# Patient Record
Sex: Female | Born: 1985 | Hispanic: Yes | Marital: Married | Smoking: Never smoker
Health system: Southern US, Community
[De-identification: ages and names within clinical notes are randomized; demographics above are authoritative.]

## PROBLEM LIST (undated history)

## (undated) DIAGNOSIS — O139 Gestational [pregnancy-induced] hypertension without significant proteinuria, unspecified trimester: Secondary | ICD-10-CM

## (undated) DIAGNOSIS — N289 Disorder of kidney and ureter, unspecified: Secondary | ICD-10-CM

## (undated) DIAGNOSIS — K8000 Calculus of gallbladder with acute cholecystitis without obstruction: Secondary | ICD-10-CM

## (undated) DIAGNOSIS — O24419 Gestational diabetes mellitus in pregnancy, unspecified control: Secondary | ICD-10-CM

## (undated) DIAGNOSIS — N83209 Unspecified ovarian cyst, unspecified side: Secondary | ICD-10-CM

## (undated) DIAGNOSIS — K8050 Calculus of bile duct without cholangitis or cholecystitis without obstruction: Secondary | ICD-10-CM

## (undated) DIAGNOSIS — R7989 Other specified abnormal findings of blood chemistry: Secondary | ICD-10-CM

## (undated) DIAGNOSIS — R945 Abnormal results of liver function studies: Secondary | ICD-10-CM

## (undated) HISTORY — PX: CHOLECYSTECTOMY: SHX55

## (undated) HISTORY — DX: Gestational diabetes mellitus in pregnancy, unspecified control: O24.419

## (undated) HISTORY — DX: Calculus of bile duct without cholangitis or cholecystitis without obstruction: K80.50

## (undated) SURGERY — Surgical Case
Anesthesia: *Unknown

---

## 2008-07-19 DIAGNOSIS — O24419 Gestational diabetes mellitus in pregnancy, unspecified control: Secondary | ICD-10-CM

## 2015-08-13 ENCOUNTER — Other Ambulatory Visit (HOSPITAL_COMMUNITY): Payer: Self-pay | Admitting: *Deleted

## 2015-08-13 DIAGNOSIS — N6320 Unspecified lump in the left breast, unspecified quadrant: Secondary | ICD-10-CM

## 2015-09-01 ENCOUNTER — Ambulatory Visit (HOSPITAL_COMMUNITY)
Admission: RE | Admit: 2015-09-01 | Discharge: 2015-09-01 | Disposition: A | Discharge status: Home / Self Care | Payer: PRIVATE HEALTH INSURANCE | Source: Ambulatory Visit | Attending: *Deleted | Admitting: *Deleted

## 2015-09-01 DIAGNOSIS — N6320 Unspecified lump in the left breast, unspecified quadrant: Secondary | ICD-10-CM

## 2015-09-01 DIAGNOSIS — N63 Unspecified lump in breast: Secondary | ICD-10-CM | POA: Diagnosis present

## 2017-09-23 ENCOUNTER — Encounter (HOSPITAL_COMMUNITY): Payer: Self-pay | Admitting: Emergency Medicine

## 2017-09-23 ENCOUNTER — Emergency Department (HOSPITAL_COMMUNITY): Payer: Self-pay

## 2017-09-23 ENCOUNTER — Emergency Department (HOSPITAL_COMMUNITY)
Admission: EM | Admit: 2017-09-23 | Discharge: 2017-09-24 | Disposition: A | Discharge status: Home / Self Care | Payer: Self-pay | Attending: Emergency Medicine | Admitting: Emergency Medicine

## 2017-09-23 DIAGNOSIS — N83202 Unspecified ovarian cyst, left side: Secondary | ICD-10-CM | POA: Insufficient documentation

## 2017-09-23 DIAGNOSIS — R1010 Upper abdominal pain, unspecified: Secondary | ICD-10-CM

## 2017-09-23 DIAGNOSIS — Z79899 Other long term (current) drug therapy: Secondary | ICD-10-CM | POA: Insufficient documentation

## 2017-09-23 LAB — POC URINE PREG, ED: Preg Test, Ur: NEGATIVE

## 2017-09-23 MED ORDER — ONDANSETRON HCL 4 MG/2ML IJ SOLN
4.0000 mg | Freq: Once | INTRAMUSCULAR | Status: AC
  Administered 2017-09-24: 4 mg via INTRAVENOUS
  Filled 2017-09-23: qty 2

## 2017-09-23 MED ORDER — MORPHINE SULFATE (PF) 4 MG/ML IV SOLN
4.0000 mg | Freq: Once | INTRAVENOUS | Status: AC
  Administered 2017-09-24: 4 mg via INTRAVENOUS
  Filled 2017-09-23: qty 1

## 2017-09-23 NOTE — ED Triage Notes (Addendum)
Pt C/O abdominal pain to the left and right side. Pt states when the pain starts it feels like her hands, face and feet are "going to sleep."

## 2017-09-23 NOTE — ED Provider Notes (Signed)
[facility] EMERGENCY DEPARTMENT Provider Note   CSN: [id number] Arrival date & time: 09/23/17  1910     History   Chief Complaint Chief Complaint  Patient presents with  . Abdominal Pain    HPI Colleen Arnold is a 31 y.o. female.  The history is provided by the patient.  She has been having pain across her mid and upper abdomen for the last year, worse over the last 3 months.  It is much worse tonight.  Pain is worse after eating.  Spicy food seems to cause more problems than other foods.  There is associated nausea and occasional vomiting.  She does not notice any improvement in pain following vomiting.  She denies fever or chills.  Denies constipation or diarrhea.  Of note, she is status post cholecystectomy.  She did see a physician who prescribed omeprazole, but that is not giving her any relief.  History reviewed. No pertinent past medical history.  There are no active problems to display for this patient.   History reviewed. No pertinent surgical history.  OB History    No data available       Home Medications    Prior to Admission medications   Medication Sig Start Date End Date Taking? Authorizing Provider  omeprazole (PRILOSEC) 20 MG capsule Take 20 mg by mouth 2 (two) times daily.   Yes [provider]    Family History No family history on file.  Social History Social History   Tobacco Use  . Smoking status: Never Smoker  . Smokeless tobacco: Never Used  Substance Use Topics  . Alcohol use: Not on file  . Drug use: Not on file     Allergies   Rocephin [ceftriaxone sodium in dextrose]   Review of Systems Review of Systems  All other systems reviewed and are negative.    Physical Exam Updated Vital Signs BP 110/61   Pulse 65   Temp 98.3 F (36.8 C) (Oral)   Resp 18   Ht 5\' 3"  (1.6 m)   Wt 110.2 kg (242 lb 14.4 oz)   LMP 09/09/2017   SpO2 98%   BMI 43.03 kg/m   Physical Exam  Nursing note and vitals reviewed.  Obese  31 year old female, resting comfortably and in no acute distress. Vital signs are normal. Oxygen saturation is 98%, which is normal. Head is normocephalic and atraumatic. PERRLA, EOMI. Oropharynx is clear. Neck is nontender and supple without adenopathy or JVD. Back is nontender and there is no CVA tenderness. Lungs are clear without rales, wheezes, or rhonchi. Chest is nontender. Heart has regular rate and rhythm without murmur. Abdomen is soft, flat, with moderate tenderness across the mid and upper abdomen.  Maximum tenderness is in the right upper quadrant with positive Murphy sign.  There are no masses or hepatosplenomegaly and peristalsis is hypoactive. Extremities have no cyanosis or edema, full range of motion is present. Skin is warm and dry without rash. Neurologic: Mental status is normal, cranial nerves are intact, there are no motor or sensory deficits.  ED Treatments / Results  Labs (all labs ordered are listed, but only abnormal results are displayed) Labs Reviewed  COMPREHENSIVE METABOLIC PANEL - Abnormal; Notable for the following components:      Result Value   Potassium 3.3 (*)    Glucose, Bld 106 (*)    All other components within normal limits  LIPASE, BLOOD  CBC WITH DIFFERENTIAL/PLATELET  URINALYSIS, ROUTINE W REFLEX MICROSCOPIC  POC URINE PREG,  ED   Radiology Ct Abdomen Pelvis W Contrast  Result Date: 09/24/2017 CLINICAL DATA:  31 year old female with left-sided abdominal pain. EXAM: CT ABDOMEN AND PELVIS WITH CONTRAST TECHNIQUE: Multidetector CT imaging of the abdomen and pelvis was performed using the standard protocol following bolus administration of intravenous contrast. CONTRAST:  171mL ISOVUE-300 IOPAMIDOL (ISOVUE-300) INJECTION 61% COMPARISON:  None. FINDINGS: Lower chest: The visualized lung bases are clear. No intra-abdominal free air or free fluid. Hepatobiliary: Probable mild fatty infiltration of the liver. Cholecystectomy. There is pneumobilia. Mild  dilatation of the common bile duct measuring up to 15 mm. No retained calcified stone noted in the central CBD. Pancreas: Unremarkable. No pancreatic ductal dilatation or surrounding inflammatory changes. Spleen: Normal in size without focal abnormality. Adrenals/Urinary Tract: Adrenal glands are unremarkable. Kidneys are normal, without renal calculi, focal lesion, or hydronephrosis. Bladder is unremarkable. Stomach/Bowel: There is no bowel obstruction or active inflammation. Normal appendix. Vascular/Lymphatic: The abdominal aorta and IVC appear unremarkable. No portal venous gas. There is no adenopathy. Reproductive: The uterus is anteverted and grossly unremarkable. There is a septated appearing cyst in the left ovary measuring approximately 5.7 x 5.6 x 6.3 cm. There is associated mild mass effect and indentation of the left bladder wall. The right ovary appears unremarkable. Other: None Musculoskeletal: No acute or significant osseous findings. IMPRESSION: 1. A probably septated left ovarian cyst measuring up to 6.3 cm. No associated inflammatory changes identified. Ultrasound with Doppler interrogation of the ovaries may provide better evaluation if there is clinical concern for torsion. 2. Cholecystectomy with associated pneumobilia. 3. No bowel obstruction or active inflammation.  Normal appendix. Electronically Signed   By: Anner Crete M.D.   On: 09/24/2017 00:41    Procedures Procedures (including critical care time)  Medications Ordered in ED Medications  ondansetron (ZOFRAN) injection 4 mg (4 mg Intravenous Given 09/24/17 0017)  morphine 4 MG/ML injection 4 mg (4 mg Intravenous Given 09/24/17 0018)  iopamidol (ISOVUE-300) 61 % injection 100 mL (100 mLs Intravenous Contrast Given 09/24/17 0026)     Initial Impression / Assessment and Plan / ED Course  I have reviewed the triage vital signs and the nursing notes.  Pertinent labs & imaging results that were available during my care of the  patient were reviewed by me and considered in my medical decision making (see chart for details).  Abdominal pain of uncertain cause.  Possible peptic ulcer disease.  Given chronicity of symptoms, will check CT of abdomen and pelvis.  CT shows no obvious cause for pain.  Incidental finding of ovarian cyst.  Patient is advised of these findings.  We will referred to gastroenterology for further outpatient workup.  In the meantime, she is to continue taking omeprazole.  Given prescriptions for oxycodone-acetaminophen for pain, and ondansetron for nausea.  Final Clinical Impressions(s) / ED Diagnoses   Final diagnoses:  Pain of upper abdomen  Cyst of left ovary    ED Discharge Orders        Ordered    oxyCODONE-acetaminophen (PERCOCET) 5-325 MG tablet  Every 4 hours PRN     09/24/17 0152    ondansetron (ZOFRAN) 4 MG tablet  Every 8 hours PRN     09/24/17 0152    oxyCODONE-acetaminophen (PERCOCET) 5-325 MG tablet  Every 4 hours PRN     09/24/17 0152    ondansetron (ZOFRAN) 4 MG tablet  Every 6 hours PRN     81/01/75 1025       Delora Fuel, MD 85/27/78 [phone]

## 2017-09-24 LAB — CBC WITH DIFFERENTIAL/PLATELET
BASOS ABS: 0 10*3/uL (ref 0.0–0.1)
BASOS PCT: 0 %
EOS ABS: 0.1 10*3/uL (ref 0.0–0.7)
EOS PCT: 1 %
HCT: 38.2 % (ref 36.0–46.0)
Hemoglobin: 12.7 g/dL (ref 12.0–15.0)
Lymphocytes Relative: 36 %
Lymphs Abs: 3.2 10*3/uL (ref 0.7–4.0)
MCH: 30.3 pg (ref 26.0–34.0)
MCHC: 33.2 g/dL (ref 30.0–36.0)
MCV: 91.2 fL (ref 78.0–100.0)
MONO ABS: 0.6 10*3/uL (ref 0.1–1.0)
Monocytes Relative: 7 %
Neutro Abs: 4.9 10*3/uL (ref 1.7–7.7)
Neutrophils Relative %: 56 %
PLATELETS: 213 10*3/uL (ref 150–400)
RBC: 4.19 MIL/uL (ref 3.87–5.11)
RDW: 12.4 % (ref 11.5–15.5)
WBC: 8.7 10*3/uL (ref 4.0–10.5)

## 2017-09-24 LAB — URINALYSIS, ROUTINE W REFLEX MICROSCOPIC
Bacteria, UA: NONE SEEN
Bilirubin Urine: NEGATIVE
GLUCOSE, UA: NEGATIVE mg/dL
KETONES UR: NEGATIVE mg/dL
LEUKOCYTES UA: NEGATIVE
NITRITE: NEGATIVE
PH: 6 (ref 5.0–8.0)
Protein, ur: NEGATIVE mg/dL
Specific Gravity, Urine: 1.016 (ref 1.005–1.030)

## 2017-09-24 LAB — COMPREHENSIVE METABOLIC PANEL
ALT: 31 U/L (ref 14–54)
AST: 28 U/L (ref 15–41)
Albumin: 3.8 g/dL (ref 3.5–5.0)
Alkaline Phosphatase: 49 U/L (ref 38–126)
Anion gap: 5 (ref 5–15)
BILIRUBIN TOTAL: 0.4 mg/dL (ref 0.3–1.2)
BUN: 17 mg/dL (ref 6–20)
CALCIUM: 9.1 mg/dL (ref 8.9–10.3)
CO2: 27 mmol/L (ref 22–32)
CREATININE: 0.61 mg/dL (ref 0.44–1.00)
Chloride: 108 mmol/L (ref 101–111)
Glucose, Bld: 106 mg/dL — ABNORMAL HIGH (ref 65–99)
Potassium: 3.3 mmol/L — ABNORMAL LOW (ref 3.5–5.1)
Sodium: 140 mmol/L (ref 135–145)
TOTAL PROTEIN: 7 g/dL (ref 6.5–8.1)

## 2017-09-24 LAB — LIPASE, BLOOD: LIPASE: 30 U/L (ref 11–51)

## 2017-09-24 MED ORDER — OXYCODONE-ACETAMINOPHEN 5-325 MG PO TABS: 1.0000 | ORAL_TABLET | ORAL | 0 refills | Status: DC | PRN

## 2017-09-24 MED ORDER — ONDANSETRON HCL 4 MG PO TABS
4.0000 mg | ORAL_TABLET | Freq: Four times a day (QID) | ORAL | 0 refills | Status: DC | PRN
Start: 2017-09-24 — End: 2017-10-07

## 2017-09-24 MED ORDER — IOPAMIDOL (ISOVUE-300) INJECTION 61%
100.0000 mL | Freq: Once | INTRAVENOUS | Status: AC | PRN
  Administered 2017-09-24: 100 mL via INTRAVENOUS

## 2017-09-24 MED ORDER — ONDANSETRON HCL 4 MG PO TABS: 4.0000 mg | ORAL_TABLET | Freq: Three times a day (TID) | ORAL | 0 refills | Status: DC | PRN

## 2017-09-24 NOTE — Discharge Instructions (Signed)
Continuar tomando omeprazole.  Regresar si los sntomas empeoran.

## 2017-09-27 ENCOUNTER — Encounter: Payer: Self-pay | Admitting: Internal Medicine

## 2017-09-28 MED FILL — Ondansetron HCl Tab 4 MG: ORAL | Qty: 4 | Status: AC

## 2017-09-28 MED FILL — Oxycodone w/ Acetaminophen Tab 5-325 MG: ORAL | Qty: 6 | Status: AC

## 2017-10-04 ENCOUNTER — Other Ambulatory Visit (HOSPITAL_COMMUNITY): Payer: Self-pay | Admitting: Nurse Practitioner

## 2017-10-04 DIAGNOSIS — R109 Unspecified abdominal pain: Secondary | ICD-10-CM

## 2017-10-04 DIAGNOSIS — N83202 Unspecified ovarian cyst, left side: Secondary | ICD-10-CM

## 2017-10-05 ENCOUNTER — Other Ambulatory Visit: Payer: Self-pay

## 2017-10-05 ENCOUNTER — Emergency Department (HOSPITAL_COMMUNITY): Payer: Self-pay

## 2017-10-05 ENCOUNTER — Encounter (HOSPITAL_COMMUNITY): Payer: Self-pay | Admitting: *Deleted

## 2017-10-05 ENCOUNTER — Inpatient Hospital Stay (HOSPITAL_COMMUNITY)
Admission: EM | Admit: 2017-10-05 | Discharge: 2017-10-07 | DRG: 446 | Disposition: A | Discharge status: Home / Self Care | Payer: Self-pay | Attending: Internal Medicine | Admitting: Internal Medicine

## 2017-10-05 DIAGNOSIS — Z79899 Other long term (current) drug therapy: Secondary | ICD-10-CM

## 2017-10-05 DIAGNOSIS — N83202 Unspecified ovarian cyst, left side: Secondary | ICD-10-CM | POA: Diagnosis present

## 2017-10-05 DIAGNOSIS — K831 Obstruction of bile duct: Secondary | ICD-10-CM | POA: Diagnosis present

## 2017-10-05 DIAGNOSIS — R7989 Other specified abnormal findings of blood chemistry: Secondary | ICD-10-CM

## 2017-10-05 DIAGNOSIS — K5900 Constipation, unspecified: Secondary | ICD-10-CM | POA: Diagnosis present

## 2017-10-05 DIAGNOSIS — R109 Unspecified abdominal pain: Secondary | ICD-10-CM | POA: Diagnosis present

## 2017-10-05 DIAGNOSIS — N838 Other noninflammatory disorders of ovary, fallopian tube and broad ligament: Secondary | ICD-10-CM | POA: Diagnosis present

## 2017-10-05 DIAGNOSIS — E876 Hypokalemia: Secondary | ICD-10-CM | POA: Diagnosis present

## 2017-10-05 DIAGNOSIS — K8050 Calculus of bile duct without cholangitis or cholecystitis without obstruction: Secondary | ICD-10-CM

## 2017-10-05 DIAGNOSIS — K8031 Calculus of bile duct with cholangitis, unspecified, with obstruction: Principal | ICD-10-CM | POA: Diagnosis present

## 2017-10-05 DIAGNOSIS — K8051 Calculus of bile duct without cholangitis or cholecystitis with obstruction: Secondary | ICD-10-CM | POA: Insufficient documentation

## 2017-10-05 DIAGNOSIS — R1013 Epigastric pain: Secondary | ICD-10-CM

## 2017-10-05 DIAGNOSIS — R945 Abnormal results of liver function studies: Secondary | ICD-10-CM

## 2017-10-05 DIAGNOSIS — Z9049 Acquired absence of other specified parts of digestive tract: Secondary | ICD-10-CM

## 2017-10-05 DIAGNOSIS — K219 Gastro-esophageal reflux disease without esophagitis: Secondary | ICD-10-CM | POA: Diagnosis present

## 2017-10-05 DIAGNOSIS — Z6839 Body mass index (BMI) 39.0-39.9, adult: Secondary | ICD-10-CM

## 2017-10-05 HISTORY — DX: Calculus of gallbladder with acute cholecystitis without obstruction: K80.00

## 2017-10-05 HISTORY — DX: Other specified abnormal findings of blood chemistry: R79.89

## 2017-10-05 HISTORY — DX: Abnormal results of liver function studies: R94.5

## 2017-10-05 HISTORY — DX: Unspecified ovarian cyst, unspecified side: N83.209

## 2017-10-05 LAB — COMPREHENSIVE METABOLIC PANEL
ALBUMIN: 3.8 g/dL (ref 3.5–5.0)
ALT: 193 U/L — ABNORMAL HIGH (ref 14–54)
ANION GAP: 8 (ref 5–15)
AST: 194 U/L — ABNORMAL HIGH (ref 15–41)
Alkaline Phosphatase: 116 U/L (ref 38–126)
BUN: 8 mg/dL (ref 6–20)
CALCIUM: 8.8 mg/dL — AB (ref 8.9–10.3)
CHLORIDE: 106 mmol/L (ref 101–111)
CO2: 24 mmol/L (ref 22–32)
Creatinine, Ser: 0.65 mg/dL (ref 0.44–1.00)
GFR calc non Af Amer: >60 mL/min (ref >60)
GLUCOSE: 135 mg/dL — AB (ref 65–99)
POTASSIUM: 3.4 mmol/L — AB (ref 3.5–5.1)
SODIUM: 138 mmol/L (ref 135–145)
Total Bilirubin: 1.4 mg/dL — ABNORMAL HIGH (ref 0.3–1.2)
Total Protein: 7.2 g/dL (ref 6.5–8.1)

## 2017-10-05 LAB — URINALYSIS, ROUTINE W REFLEX MICROSCOPIC
BILIRUBIN URINE: NEGATIVE
Glucose, UA: NEGATIVE mg/dL
Ketones, ur: 20 mg/dL — AB
Leukocytes, UA: NEGATIVE
Nitrite: NEGATIVE
Protein, ur: NEGATIVE mg/dL
SPECIFIC GRAVITY, URINE: 1.012 (ref 1.005–1.030)
pH: 7 (ref 5.0–8.0)

## 2017-10-05 LAB — CBC
HEMATOCRIT: 36.5 % (ref 36.0–46.0)
HEMOGLOBIN: 13 g/dL (ref 12.0–15.0)
MCH: 31.2 pg (ref 26.0–34.0)
MCHC: 35.6 g/dL (ref 30.0–36.0)
MCV: 87.5 fL (ref 78.0–100.0)
Platelets: 222 10*3/uL (ref 150–400)
RBC: 4.17 MIL/uL (ref 3.87–5.11)
RDW: 12.3 % (ref 11.5–15.5)
WBC: 6.3 10*3/uL (ref 4.0–10.5)

## 2017-10-05 LAB — LIPASE, BLOOD: LIPASE: 38 U/L (ref 11–51)

## 2017-10-05 LAB — I-STAT BETA HCG BLOOD, ED (MC, WL, AP ONLY)

## 2017-10-05 MED ORDER — IOPAMIDOL (ISOVUE-300) INJECTION 61%
INTRAVENOUS | Status: AC
  Filled 2017-10-05: qty 30

## 2017-10-05 MED ORDER — ONDANSETRON HCL 4 MG/2ML IJ SOLN
4.0000 mg | Freq: Once | INTRAMUSCULAR | Status: AC
  Administered 2017-10-05: 4 mg via INTRAVENOUS
  Filled 2017-10-05: qty 2

## 2017-10-05 MED ORDER — HYDROMORPHONE HCL 1 MG/ML IJ SOLN
1.0000 mg | Freq: Once | INTRAMUSCULAR | Status: AC
  Administered 2017-10-05: 1 mg via INTRAVENOUS
  Filled 2017-10-05: qty 1

## 2017-10-05 MED ORDER — SUCRALFATE 1 G PO TABS
1.0000 g | ORAL_TABLET | Freq: Once | ORAL | Status: AC
  Administered 2017-10-05: 1 g via ORAL
  Filled 2017-10-05: qty 1

## 2017-10-05 MED ORDER — PANTOPRAZOLE SODIUM 40 MG IV SOLR
40.0000 mg | Freq: Once | INTRAVENOUS | Status: AC
  Administered 2017-10-05: 40 mg via INTRAVENOUS
  Filled 2017-10-05: qty 40

## 2017-10-05 MED ORDER — IOPAMIDOL (ISOVUE-300) INJECTION 61%
INTRAVENOUS | Status: AC
  Administered 2017-10-05: 75 mL
  Filled 2017-10-05: qty 75

## 2017-10-05 MED ORDER — GI COCKTAIL ~~LOC~~
30.0000 mL | Freq: Once | ORAL | Status: AC
Start: 2017-10-05 — End: 2017-10-05
  Administered 2017-10-05: 30 mL via ORAL
  Filled 2017-10-05: qty 30

## 2017-10-05 NOTE — ED Provider Notes (Signed)
[facility] EMERGENCY DEPARTMENT Provider Note   CSN: [id number] Arrival date & time: 10/05/17  1736     History   Chief Complaint Chief Complaint  Patient presents with  . Abdominal Pain    HPI Colleen Arnold is a 31 y.o. female.  HPI  Colleen Arnold is a 31 y.o. female presents to emergency department complaining of epigastric abdominal pain.  Patient reports pain for several months.  She states pain radiates around to the back.  She reports pain is severe.  Was seen a few weeks ago, had unremarkable labs and negative CT abdomen pelvis.  Patient is currently taking oxycodone and Zofran which is not helping.  She denies change in her bowels.  She states she is unable to eat and has lost a lot of weight due to this.  She states any eating or drinking making her pain worse.  Nothing is making it better.  She reports associated nausea and vomiting.  Denies blood in her or emesis.  She has an appointment with gastroenterologist in February which is 2 months from now.  She denies drinking alcohol.  She states she used to drink alcohol but has quit.  She is taking ibuprofen for headache, but states she is not taking it daily.  She denies smoking.  No other complaints   History reviewed. No pertinent past medical history.  There are no active problems to display for this patient.   History reviewed. No pertinent surgical history.  OB History    No data available       Home Medications    Prior to Admission medications   Medication Sig Start Date End Date Taking? Authorizing Provider  metroNIDAZOLE (FLAGYL) 500 MG tablet Take 500 mg by mouth 2 (two) times daily.   Yes [provider]  omeprazole (PRILOSEC) 20 MG capsule Take 20 mg by mouth 2 (two) times daily.   Yes [provider]  ondansetron (ZOFRAN) 4 MG tablet Take 1 tablet (4 mg total) by mouth every 8 (eight) hours as needed for nausea or vomiting. 50/5/39  Yes Delora Fuel, MD    oxyCODONE-acetaminophen (PERCOCET) 5-325 MG tablet Take 1 tablet by mouth every 4 (four) hours as needed for moderate pain. 76/7/34  Yes Delora Fuel, MD  ondansetron (ZOFRAN) 4 MG tablet Take 1 tablet (4 mg total) by mouth every 6 (six) hours as needed. Patient not taking: Reported on 19/37/9024 06/23/34   Delora Fuel, MD  oxyCODONE-acetaminophen (PERCOCET) 5-325 MG tablet Take 1 tablet by mouth every 4 (four) hours as needed for moderate pain. Patient not taking: Reported on 32/99/2426 83/4/19   Delora Fuel, MD    Family History History reviewed. No pertinent family history.  Social History Social History   Tobacco Use  . Smoking status: Never Smoker  . Smokeless tobacco: Never Used  Substance Use Topics  . Alcohol use: No    Frequency: Never  . Drug use: No     Allergies   Rocephin [ceftriaxone sodium in dextrose]   Review of Systems Review of Systems  Constitutional: Negative for chills and fever.  Respiratory: Negative for cough, chest tightness and shortness of breath.   Cardiovascular: Negative for chest pain, palpitations and leg swelling.  Gastrointestinal: Positive for abdominal pain, nausea and vomiting. Negative for diarrhea.  Genitourinary: Negative for dysuria, flank pain, pelvic pain, vaginal bleeding, vaginal discharge and vaginal pain.  Musculoskeletal: Negative for arthralgias, myalgias, neck pain and neck stiffness.  Skin: Negative for rash.  Neurological:  Negative for dizziness, weakness and headaches.  All other systems reviewed and are negative.    Physical Exam Updated Vital Signs BP (!) 111/50   Pulse 96   Temp 98 F (36.7 C) (Oral)   Resp 18   LMP 09/09/2017   SpO2 98%   Physical Exam  Constitutional: She appears well-developed and well-nourished. No distress.  HENT:  Head: Normocephalic.  Eyes: Conjunctivae are normal.  Neck: Neck supple.  Cardiovascular: Normal rate, regular rhythm and normal heart sounds.  Pulmonary/Chest:  Effort normal and breath sounds normal. No respiratory distress. She has no wheezes. She has no rales.  Abdominal: Soft. Bowel sounds are normal. She exhibits no distension. There is tenderness in the epigastric area. There is no rigidity, no rebound and no guarding.  Musculoskeletal: She exhibits no edema.  Neurological: She is alert.  Skin: Skin is warm and dry.  Psychiatric: She has a normal mood and affect. Her behavior is normal.  Nursing note and vitals reviewed.    ED Treatments / Results  Labs (all labs ordered are listed, but only abnormal results are displayed) Labs Reviewed  COMPREHENSIVE METABOLIC PANEL - Abnormal; Notable for the following components:      Result Value   Potassium 3.4 (*)    Glucose, Bld 135 (*)    Calcium 8.8 (*)    AST 194 (*)    ALT 193 (*)    Total Bilirubin 1.4 (*)    All other components within normal limits  URINALYSIS, ROUTINE W REFLEX MICROSCOPIC - Abnormal; Notable for the following components:   APPearance CLOUDY (*)    Hgb urine dipstick LARGE (*)    Ketones, ur 20 (*)    Bacteria, UA RARE (*)    Squamous Epithelial / LPF 0-5 (*)    All other components within normal limits  LIPASE, BLOOD  CBC  I-STAT BETA HCG BLOOD, ED ([facility], [facility], [facility] ONLY)    EKG  EKG Interpretation None       Radiology Ct Abdomen Pelvis W Contrast  Result Date: 10/05/2017 CLINICAL DATA:  Progressively worsening abdominal pain over the past year. Associated nausea and vomiting. EXAM: CT ABDOMEN AND PELVIS WITH CONTRAST TECHNIQUE: Multidetector CT imaging of the abdomen and pelvis was performed using the standard protocol following bolus administration of intravenous contrast. CONTRAST:  100 mL ISOVUE-300 IOPAMIDOL (ISOVUE-300) INJECTION 61% COMPARISON:  CT abdomen pelvis dated September 24, 2017. FINDINGS: Lower chest: No acute abnormality. Hepatobiliary: No focal liver abnormality. Prior cholecystectomy. Trace pneumobilia, decreased when compared to prior study.  Stable dilatation of the common bile duct. Pancreas: Unremarkable. No pancreatic ductal dilatation or surrounding inflammatory changes. Spleen: Normal in size without focal abnormality. Adrenals/Urinary Tract: Adrenal glands are unremarkable. Kidneys are normal, without renal calculi, focal lesion, or hydronephrosis. Bladder is unremarkable. Stomach/Bowel: Stomach is within normal limits. Tiny appendicolith near the tip of the appendix is unchanged. The appendix is otherwise unremarkable, without dilatation, wall thickening, or surrounding inflammatory changes. No evidence of bowel wall thickening, distention, or inflammatory changes. Vascular/Lymphatic: No significant vascular findings are present. No enlarged abdominal or pelvic lymph nodes. Reproductive: Interval increase in size of a low-density cystic lesion within the left ovary, containing a thin septation. The uterus and right ovary are unremarkable. Other: No free fluid or pneumoperitoneum. Musculoskeletal: No acute or significant osseous findings. IMPRESSION: 1.  No acute intra-abdominal process. 2. Slight interval increase in size of a septated left ovarian cyst, measuring up to 7.8 cm. While this lesion is probably benign,  given its size, non-emergent pelvic ultrasound is recommended for further characterization. This recommendation follows ACR consensus guidelines: White Paper of the ACR Incidental Findings Committee II on Adnexal Findings. J Am Coll Radiol [id number]. Electronically Signed   By: Titus Dubin M.D.   On: 10/05/2017 22:04    Procedures Procedures (including critical care time)  Medications Ordered in ED Medications  iopamidol (ISOVUE-300) 61 % injection (not administered)  HYDROmorphone (DILAUDID) injection 1 mg (1 mg Intravenous Given 10/05/17 2059)  ondansetron (ZOFRAN) injection 4 mg (4 mg Intravenous Given 10/05/17 2059)  pantoprazole (PROTONIX) injection 40 mg (40 mg Intravenous Given 10/05/17 2059)  gi cocktail  (Maalox,Lidocaine,Donnatal) (30 mLs Oral Given 10/05/17 2059)  iopamidol (ISOVUE-300) 61 % injection (75 mLs  Contrast Given 10/05/17 2138)     Initial Impression / Assessment and Plan / ED Course  I have reviewed the triage vital signs and the nursing notes.  Pertinent labs & imaging results that were available during my care of the patient were reviewed by me and considered in my medical decision making (see chart for details).     Patient in the emergency department with worsening epigastric abdominal pain.  Patient is tearful, rocking in the bed.  Pain is mainly in the epigastric area.  No abdominal rigidity.  Patient had CT scan 2 weeks ago which was unremarkable.  Given the amount of pain, will re-scan.  Potentially gastritis versus peptic ulcer disease versus perforated bowel.  Patient is also complaining of nausea vomiting.  Could be a small bowel obstruction.  Labs pending  10:33 PM Labs unremarkable other than acutely elevated LFTs and slight elevation in bilirubin.  Patient denies drinking alcohol.  She does not take any Tylenol.  Will add hepatitis panel CT scan with no findings to explain patient's pain other than worsening ovarian cyst.  Patient does not have any pain in the pelvic area.  She had no relief of pain with Dilaudid, GI cocktail, Protonix.  She is complaining of some nausea.  Will give more proximal nausea medication and get ultrasound to rule out a common bile duct stone.  1:13 AM Pt's US shows dilated common bile duct.  In setting of elevated LFTs and uncontrolled pain and vomiting in the ED, will contact gastroenterology. Spoke with Dr. Alessandra Bevels with gastroenterology, who recommended MRCP and admission, he will consult in the morning.  Possible EGD, continue to keep n.p.o.  Spoke with hospitalist, will admit.   Final Clinical Impressions(s) / ED Diagnoses   Final diagnoses:  Epigastric pain  Elevated LFTs    ED Discharge Orders    None         Jeannett Senior, PA-C 10/06/17 0127    Nat Christen, MD 10/07/17 [phone]

## 2017-10-05 NOTE — ED Notes (Signed)
Patient transported to Ultrasound 

## 2017-10-05 NOTE — ED Notes (Signed)
Two gold tubes in the lab

## 2017-10-05 NOTE — ED Triage Notes (Signed)
Pt reports abd pain for over a year, getting progressively worse. Pain increases after eating and radiates to her back. Has n/v.

## 2017-10-06 ENCOUNTER — Ambulatory Visit (HOSPITAL_COMMUNITY): Admission: RE | Admit: 2017-10-06 | Payer: PRIVATE HEALTH INSURANCE | Source: Ambulatory Visit

## 2017-10-06 ENCOUNTER — Encounter (HOSPITAL_COMMUNITY)
Admission: EM | Disposition: A | Discharge status: Home / Self Care | Payer: Self-pay | Source: Home / Self Care | Attending: Internal Medicine

## 2017-10-06 ENCOUNTER — Inpatient Hospital Stay (HOSPITAL_COMMUNITY): Payer: Self-pay

## 2017-10-06 ENCOUNTER — Inpatient Hospital Stay (HOSPITAL_COMMUNITY): Payer: Self-pay | Admitting: Anesthesiology

## 2017-10-06 ENCOUNTER — Encounter (HOSPITAL_COMMUNITY): Payer: Self-pay | Admitting: Family Medicine

## 2017-10-06 DIAGNOSIS — K8050 Calculus of bile duct without cholangitis or cholecystitis without obstruction: Secondary | ICD-10-CM

## 2017-10-06 DIAGNOSIS — K8051 Calculus of bile duct without cholangitis or cholecystitis with obstruction: Secondary | ICD-10-CM | POA: Insufficient documentation

## 2017-10-06 DIAGNOSIS — N83209 Unspecified ovarian cyst, unspecified side: Secondary | ICD-10-CM

## 2017-10-06 DIAGNOSIS — R945 Abnormal results of liver function studies: Secondary | ICD-10-CM

## 2017-10-06 DIAGNOSIS — N838 Other noninflammatory disorders of ovary, fallopian tube and broad ligament: Secondary | ICD-10-CM | POA: Diagnosis present

## 2017-10-06 DIAGNOSIS — R7989 Other specified abnormal findings of blood chemistry: Secondary | ICD-10-CM | POA: Diagnosis present

## 2017-10-06 DIAGNOSIS — K8035 Calculus of bile duct with chronic cholangitis with obstruction: Secondary | ICD-10-CM

## 2017-10-06 DIAGNOSIS — R109 Unspecified abdominal pain: Secondary | ICD-10-CM | POA: Diagnosis present

## 2017-10-06 DIAGNOSIS — K831 Obstruction of bile duct: Secondary | ICD-10-CM | POA: Diagnosis present

## 2017-10-06 HISTORY — PX: ERCP: SHX5425

## 2017-10-06 LAB — GLUCOSE, CAPILLARY: Glucose-Capillary: 95 mg/dL (ref 65–99)

## 2017-10-06 LAB — COMPREHENSIVE METABOLIC PANEL
ALK PHOS: 106 U/L (ref 38–126)
ALT: 284 U/L — ABNORMAL HIGH (ref 14–54)
ANION GAP: 9 (ref 5–15)
AST: 228 U/L — ABNORMAL HIGH (ref 15–41)
Albumin: 3.5 g/dL (ref 3.5–5.0)
BILIRUBIN TOTAL: 1.5 mg/dL — AB (ref 0.3–1.2)
BUN: 5 mg/dL — ABNORMAL LOW (ref 6–20)
CALCIUM: 8.5 mg/dL — AB (ref 8.9–10.3)
CO2: 21 mmol/L — ABNORMAL LOW (ref 22–32)
Chloride: 106 mmol/L (ref 101–111)
Creatinine, Ser: 0.54 mg/dL (ref 0.44–1.00)
GLUCOSE: 128 mg/dL — AB (ref 65–99)
POTASSIUM: 3.5 mmol/L (ref 3.5–5.1)
Sodium: 136 mmol/L (ref 135–145)
TOTAL PROTEIN: 6.7 g/dL (ref 6.5–8.1)

## 2017-10-06 LAB — CBC WITH DIFFERENTIAL/PLATELET
BASOS ABS: 0 10*3/uL (ref 0.0–0.1)
BASOS PCT: 0 %
Eosinophils Absolute: 0 10*3/uL (ref 0.0–0.7)
Eosinophils Relative: 0 %
HEMATOCRIT: 36.4 % (ref 36.0–46.0)
Hemoglobin: 12.5 g/dL (ref 12.0–15.0)
LYMPHS PCT: 7 %
Lymphs Abs: 0.6 10*3/uL — ABNORMAL LOW (ref 0.7–4.0)
MCH: 30.7 pg (ref 26.0–34.0)
MCHC: 34.3 g/dL (ref 30.0–36.0)
MCV: 89.4 fL (ref 78.0–100.0)
MONO ABS: 0.5 10*3/uL (ref 0.1–1.0)
Monocytes Relative: 6 %
NEUTROS ABS: 7.3 10*3/uL (ref 1.7–7.7)
NEUTROS PCT: 87 %
Platelets: 193 10*3/uL (ref 150–400)
RBC: 4.07 MIL/uL (ref 3.87–5.11)
RDW: 12.7 % (ref 11.5–15.5)
WBC: 8.3 10*3/uL (ref 4.0–10.5)

## 2017-10-06 LAB — HIV ANTIBODY (ROUTINE TESTING W REFLEX): HIV Screen 4th Generation wRfx: NONREACTIVE

## 2017-10-06 LAB — MAGNESIUM: MAGNESIUM: 1.8 mg/dL (ref 1.7–2.4)

## 2017-10-06 SURGERY — ERCP, WITH INTERVENTION IF INDICATED
Anesthesia: General

## 2017-10-06 MED ORDER — LACTATED RINGERS IV SOLN
INTRAVENOUS | Status: DC | PRN
  Administered 2017-10-06: 13:00:00 via INTRAVENOUS

## 2017-10-06 MED ORDER — SODIUM CHLORIDE 0.9 % IV BOLUS (SEPSIS)
1000.0000 mL | Freq: Once | INTRAVENOUS | Status: AC
  Administered 2017-10-06: 1000 mL via INTRAVENOUS

## 2017-10-06 MED ORDER — INDOMETHACIN 50 MG RE SUPP
100.0000 mg | Freq: Once | RECTAL | Status: DC
  Filled 2017-10-06: qty 2

## 2017-10-06 MED ORDER — FAMOTIDINE 20 MG PO TABS
20.0000 mg | ORAL_TABLET | Freq: Two times a day (BID) | ORAL | Status: DC
  Administered 2017-10-06 – 2017-10-07 (×2): 20 mg via ORAL
  Filled 2017-10-06 (×2): qty 1

## 2017-10-06 MED ORDER — GADOBENATE DIMEGLUMINE 529 MG/ML IV SOLN
20.0000 mL | Freq: Once | INTRAVENOUS | Status: AC | PRN
  Administered 2017-10-06: 20 mL via INTRAVENOUS

## 2017-10-06 MED ORDER — SODIUM CHLORIDE 0.9 % IV SOLN
INTRAVENOUS | Status: DC | PRN
  Administered 2017-10-06: 30 mL

## 2017-10-06 MED ORDER — ONDANSETRON HCL 4 MG PO TABS
4.0000 mg | ORAL_TABLET | Freq: Four times a day (QID) | ORAL | Status: DC | PRN
  Filled 2017-10-06: qty 1

## 2017-10-06 MED ORDER — ONDANSETRON HCL 4 MG/2ML IJ SOLN
4.0000 mg | Freq: Once | INTRAMUSCULAR | Status: AC
  Administered 2017-10-06: 4 mg via INTRAVENOUS
  Filled 2017-10-06: qty 2

## 2017-10-06 MED ORDER — ACETAMINOPHEN 650 MG RE SUPP: 650.0000 mg | Freq: Four times a day (QID) | RECTAL | Status: DC | PRN

## 2017-10-06 MED ORDER — INDOMETHACIN 50 MG RE SUPP
RECTAL | Status: AC
  Filled 2017-10-06: qty 2

## 2017-10-06 MED ORDER — ROCURONIUM BROMIDE 10 MG/ML (PF) SYRINGE
PREFILLED_SYRINGE | INTRAVENOUS | Status: DC | PRN
  Administered 2017-10-06: 40 mg via INTRAVENOUS

## 2017-10-06 MED ORDER — METRONIDAZOLE IN NACL 5-0.79 MG/ML-% IV SOLN
500.0000 mg | Freq: Three times a day (TID) | INTRAVENOUS | Status: DC
  Administered 2017-10-06 – 2017-10-07 (×4): 500 mg via INTRAVENOUS
  Filled 2017-10-06 (×5): qty 100

## 2017-10-06 MED ORDER — PROMETHAZINE HCL 25 MG/ML IJ SOLN
12.5000 mg | Freq: Four times a day (QID) | INTRAMUSCULAR | Status: DC | PRN
  Administered 2017-10-06: 12.5 mg via INTRAVENOUS
  Filled 2017-10-06: qty 1

## 2017-10-06 MED ORDER — INDOMETHACIN 50 MG RE SUPP
RECTAL | Status: DC | PRN
  Administered 2017-10-06: 100 mg via RECTAL

## 2017-10-06 MED ORDER — HYDROMORPHONE HCL 1 MG/ML IJ SOLN
1.0000 mg | Freq: Once | INTRAMUSCULAR | Status: AC
  Administered 2017-10-06: 1 mg via INTRAVENOUS
  Filled 2017-10-06: qty 1

## 2017-10-06 MED ORDER — POTASSIUM CHLORIDE IN NACL 20-0.9 MEQ/L-% IV SOLN
INTRAVENOUS | Status: AC
  Administered 2017-10-06: 04:00:00 via INTRAVENOUS
  Filled 2017-10-06: qty 1000

## 2017-10-06 MED ORDER — DEXAMETHASONE SODIUM PHOSPHATE 10 MG/ML IJ SOLN
INTRAMUSCULAR | Status: DC | PRN
  Administered 2017-10-06: 6 mg via INTRAVENOUS

## 2017-10-06 MED ORDER — SODIUM CHLORIDE 0.9 % IV SOLN: Freq: Once | INTRAVENOUS | Status: DC

## 2017-10-06 MED ORDER — ONDANSETRON HCL 4 MG/2ML IJ SOLN: 4.0000 mg | Freq: Four times a day (QID) | INTRAMUSCULAR | Status: DC | PRN

## 2017-10-06 MED ORDER — GLUCAGON HCL RDNA (DIAGNOSTIC) 1 MG IJ SOLR
INTRAMUSCULAR | Status: AC
  Filled 2017-10-06: qty 1

## 2017-10-06 MED ORDER — CIPROFLOXACIN IN D5W 400 MG/200ML IV SOLN
400.0000 mg | Freq: Two times a day (BID) | INTRAVENOUS | Status: DC
  Administered 2017-10-06 – 2017-10-07 (×3): 400 mg via INTRAVENOUS
  Filled 2017-10-06 (×4): qty 200

## 2017-10-06 MED ORDER — ACETAMINOPHEN 325 MG PO TABS
650.0000 mg | ORAL_TABLET | Freq: Four times a day (QID) | ORAL | Status: DC | PRN
  Administered 2017-10-06: 650 mg via ORAL
  Filled 2017-10-06: qty 2

## 2017-10-06 MED ORDER — FAMOTIDINE IN NACL 20-0.9 MG/50ML-% IV SOLN
20.0000 mg | Freq: Two times a day (BID) | INTRAVENOUS | Status: DC
  Administered 2017-10-06: 20 mg via INTRAVENOUS
  Filled 2017-10-06 (×2): qty 50

## 2017-10-06 MED ORDER — SUGAMMADEX SODIUM 200 MG/2ML IV SOLN
INTRAVENOUS | Status: DC | PRN
  Administered 2017-10-06: 200 mg via INTRAVENOUS

## 2017-10-06 MED ORDER — LIDOCAINE 2% (20 MG/ML) 5 ML SYRINGE
INTRAMUSCULAR | Status: DC | PRN
  Administered 2017-10-06: 60 mg via INTRAVENOUS

## 2017-10-06 MED ORDER — MIDAZOLAM HCL 5 MG/5ML IJ SOLN
INTRAMUSCULAR | Status: DC | PRN
  Administered 2017-10-06: 2 mg via INTRAVENOUS

## 2017-10-06 MED ORDER — SUCCINYLCHOLINE CHLORIDE 200 MG/10ML IV SOSY
PREFILLED_SYRINGE | INTRAVENOUS | Status: DC | PRN
  Administered 2017-10-06: 100 mg via INTRAVENOUS

## 2017-10-06 MED ORDER — FENTANYL CITRATE (PF) 250 MCG/5ML IJ SOLN
INTRAMUSCULAR | Status: DC | PRN
  Administered 2017-10-06: 100 ug via INTRAVENOUS

## 2017-10-06 MED ORDER — PROPOFOL 10 MG/ML IV BOLUS
INTRAVENOUS | Status: DC | PRN
  Administered 2017-10-06: 200 mg via INTRAVENOUS

## 2017-10-06 MED ORDER — ONDANSETRON HCL 4 MG/2ML IJ SOLN
INTRAMUSCULAR | Status: DC | PRN
  Administered 2017-10-06: 4 mg via INTRAVENOUS

## 2017-10-06 MED ORDER — ENOXAPARIN SODIUM 40 MG/0.4ML ~~LOC~~ SOLN: 40.0000 mg | Freq: Every day | SUBCUTANEOUS | Status: DC

## 2017-10-06 MED ORDER — FENTANYL CITRATE (PF) 100 MCG/2ML IJ SOLN
25.0000 ug | INTRAMUSCULAR | Status: DC | PRN
  Administered 2017-10-06 – 2017-10-07 (×5): 50 ug via INTRAVENOUS
  Filled 2017-10-06 (×6): qty 2

## 2017-10-06 MED ORDER — IOPAMIDOL (ISOVUE-300) INJECTION 61%
INTRAVENOUS | Status: AC
  Filled 2017-10-06: qty 50

## 2017-10-06 NOTE — ED Notes (Signed)
Pt in US

## 2017-10-06 NOTE — Op Note (Addendum)
[facility] Patient Name: Colleen Arnold Procedure Date : 10/06/2017 MRN: [id number] Attending MD: Gatha Mayer , MD Date of Birth: 25-Jul-1986 CSN: [id number] Age: 31 Admit Type: Inpatient Procedure:                ERCP Indications:              Bile duct stone(s) Providers:                Gatha Mayer, MD, Cleda Daub, RN, Alan Mulder, Technician Referring MD:              Medicines:                General Anesthesia, On IV Cipro and metronidazole Complications:            No immediate complications. Estimated Blood Loss:     Estimated blood loss: none. Procedure:                Pre-Anesthesia Assessment:                           - Prior to the procedure, a History and Physical                            was performed, and patient medications and                            allergies were reviewed. The patient's tolerance of                            previous anesthesia was also reviewed. The risks                            and benefits of the procedure and the sedation                            options and risks were discussed with the patient.                            All questions were answered, and informed consent                            was obtained. Prior Anticoagulants: The patient has                            taken no previous anticoagulant or antiplatelet                            agents. ASA Grade Assessment: II - A patient with                            mild systemic disease. After reviewing the risks  and benefits, the patient was deemed in                            satisfactory condition to undergo the procedure.                           After obtaining informed consent, the scope was                            passed under direct vision. Throughout the                            procedure, the patient's blood pressure, pulse, and                            oxygen saturations  were monitored continuously. The                            [id number]-[id number]) scope was introduced through                            the mouth, and used to inject contrast into and                            used to inject contrast into the bile duct. The                            ERCP was accomplished without difficulty. The                            patient tolerated the procedure well. Scope In: Scope Out: Findings:      A scout film of the abdomen was obtained. Surgical clips, consistent       with a previous cholecystectomy, were seen in the area of the right       upper quadrant of the abdomen.      Esophagus looked normal - limited views. Patchy proximal gastric       erythema seen- otherwise normal stomach. Duodenum normal, patent prior       sphincterotomy seen. A wire was placed into CBD using sphincterotome as       guide, then 12-15 mm balloon inserted and contrast injected. Duct       dilated 15 mm max. No clear stone seen, some air bubbles. Balloon sweeps       yielded stone fragments - soft and firm which eventually cleared w/       multiple sweeps. Pancreas not entered by intent.The lower third of the       main bile duct contained stone(s) mm. Impression:               - Choledocholithiasis was found. Removal was not                            attempted; no stent was inserted. Recommendation:           - Return patient to hospital ward for ongoing care.                           -  Continue antibiotics overnight                           clears today                           If ok in AM home - no antibiotics needed. Procedure Code(s):        --- Professional ---                           [phone], Endoscopic retrograde                            cholangiopancreatography (ERCP); with removal of                            calculi/debris from biliary/pancreatic duct(s) Diagnosis Code(s):        --- Professional ---                           K80.50, Calculus of bile duct  without cholangitis                            or cholecystitis without obstruction CPT copyright 2016 American Medical Association. All rights reserved. The codes documented in this report are preliminary and upon coder review may  be revised to meet current compliance requirements. Gatha Mayer, MD 10/06/2017 2:22:34 PM This report has been signed electronically. Number of Addenda: 1 Addendum Number: 1   Addendum Date: 10/25/2017 3:54:20 PM      CORRECTION:      Stone fragments were removed from bile duct as written in findings Gatha Mayer, MD 10/25/2017 3:54:53 PM This report has been signed electronically.

## 2017-10-06 NOTE — Progress Notes (Signed)
Patient seen and examined. Admitted after midnight secondary to RUQ pain, nausea and vomiting. Found to have choledocholithiasis by image studies and with elevated transaminitis. Patient spiked fever, even was otherwise hemodynamically stable. After discussing with GI plan decided was to pursuit ERCP. Patient started on cipro and Flagyl giving fever in the presence of potential cholangitis. Please refer to H&P written by Dr. Myna Hidalgo for further info/details on admission.  Barton Dubois MD [phone]

## 2017-10-06 NOTE — Consult Note (Addendum)
[facility] Consult: 10:16 AM 10/06/2017  LOS: 0 days    Referring Provider: Dr Thurnell Garbe  Primary Care Physician:  Raiford Simmonds., PA-C in [facility] Primary Gastroenterologist:  unassigned     Reason for Consultation: Choledocholithiasis   HPI: Colleen Arnold is a 31 y.o. female.  Patient is obese but generally healthy.  9 years ago, within a week after C-section, she underwent cholecystectomy.  She describes having an endoscopy, not clear if this was an ERCP or EGD, at the time.    For about a year she is been having intermittent episodes of pain in her upper abdomen which can radiate down into her lower abdomen bilaterally.  These may be associated with nausea and, less frequently, with emesis.  Attacks have become more common in the last 3 months and she has sought medical attention.  On 09/23/17 LFTs and lipase normal.  CBC unremarkable.  Pregnancy test negative.   09/24/17 CT abdomen pelvis.  Septated left ovarian cyst, 6.3 cm causing mild mass-effect on adjacent left bladder wall.  Cholecystectomy and associated pneumobilia.  15 mm CBD without any obstruction or stones.  Mild fatty liver. She was scheduled to have a pelvic ultrasound for evaluation of the cyst today. However within the last week the pain is constant.  She is been vomiting.  Over the weekend she vomited bilious material, then dark material and a small amount of blood a couple of times.  The next day, Sunday her vomit was again golden bilious.  Because of the ongoing symptoms she presented to the ED yesterday.  LFTs now elevated with AST/ALT 194/193 >> 228/284 overnight.  T bili 1.4 >> 1.5.  Alkaline phosphatase normal.  CBC still unremarkable. Temperature reached as high as 101.2 but she has defervesced since then.  Started on  Cipro/Flagyl 10/05/17 abdominal ultrasound.  Absent gallbladder.  13 mm CBD but no visible mass or sludge/stone.  Liver normal.  Limited views of pancreas unremarkable 10/05/17 CT abdomen pelvis.  Left ovarian cyst slightly increased, now 7.8 cm.  Probably benign but nonemergent pelvic ultrasound recommended.  Stable appearance of common bile duct.  Pneumobilia decreased compared with prior study 10/06/17 MRCP.  Intra-and extrahepatic biliary ductal dilatation.  CBD 16 mm with filling defect in the distal common bile duct compatible with CBD stone  She has prescriptions for as needed Zofran and oxycodone.  She does not like taking the oxycodone because it has been causing constipation and headaches.  She has not been using any NSAIDs or PPI.  Appetite is poor.  In the last few months she is lost about 20 #, weighs 225 # currently Patient speaks English but her [place] speaking skills are limited.  Using diagrams and simple language as well as the help of a friend who is in the room, communication was adequate   Past Medical History:  Diagnosis Date  . Choledochitis    history of   . Cyst, ovarian   . Elevated LFTs     Past Surgical History:  Procedure Laterality Date  . CESAREAN  SECTION    . CHOLECYSTECTOMY      Prior to Admission medications   Medication Sig Start Date End Date Taking? Authorizing Provider  metroNIDAZOLE (FLAGYL) 500 MG tablet Take 500 mg by mouth 2 (two) times daily.   Yes [provider]  omeprazole (PRILOSEC) 20 MG capsule Take 20 mg by mouth 2 (two) times daily.   Yes [provider]  ondansetron (ZOFRAN) 4 MG tablet Take 1 tablet (4 mg total) by mouth every 8 (eight) hours as needed for nausea or vomiting. 61/4/43  Yes Delora Fuel, MD  oxyCODONE-acetaminophen (PERCOCET) 5-325 MG tablet Take 1 tablet by mouth every 4 (four) hours as needed for moderate pain. 15/4/00  Yes Delora Fuel, MD  ondansetron (ZOFRAN) 4 MG tablet Take 1 tablet (4 mg total)  by mouth every 6 (six) hours as needed. Patient not taking: Reported on 86/76/1950 93/2/67   Delora Fuel, MD  oxyCODONE-acetaminophen (PERCOCET) 5-325 MG tablet Take 1 tablet by mouth every 4 (four) hours as needed for moderate pain. Patient not taking: Reported on 12/45/8099 83/3/82   Delora Fuel, MD    Scheduled Meds: . enoxaparin (LOVENOX) injection  40 mg Subcutaneous Daily   Infusions: . 0.9 % NaCl with KCl 20 mEq / L 125 mL/hr at 10/06/17 0426  . ciprofloxacin 400 mg (10/06/17 0857)  . famotidine (PEPCID) IV Stopped (10/06/17 0601)  . metronidazole 500 mg (10/06/17 0858)   PRN Meds: acetaminophen **OR** acetaminophen, fentaNYL (SUBLIMAZE) injection, ondansetron **OR** ondansetron (ZOFRAN) IV, promethazine   Allergies as of 10/05/2017 - Review Complete 10/05/2017  Allergen Reaction Noted  . Rocephin [ceftriaxone sodium in dextrose] Rash 09/23/2017    History reviewed. No pertinent family history.  Social History   Socioeconomic History  . Marital status: Single                                              Tobacco Use  . Smoking status: Never Smoker  . Smokeless tobacco: Never Used  Substance and Sexual Activity  . Alcohol use: No    Frequency: Never  . Drug use: No    REVIEW OF SYSTEMS: Constitutional: Generally she has good energy but recently has been experiencing malaise. ENT:  No nose bleeds Pulm: No cough or trouble breathing CV:  No palpitations, no LE edema.  No chest pain GU:  No hematuria, no frequency GYN: Periods are regular for the most part.   GI:  Per HPI Heme: No unusual bleeding or excessive bruising. Transfusions: Never Neuro: Again oxycodone has been causing headaches.  No peripheral tingling or numbness.  No syncope.  No history of seizures. Derm:  No itching, no rash or sores.  Has not noticed any yellowing of the skin. Endocrine:  No sweats or chills.  No polyuria or dysuria Immunization: Did not inquire as to recent  immunizations. Travel:  None beyond local counties in last few months.    PHYSICAL EXAM: Vital signs in last 24 hours: Vitals:   10/06/17 0224 10/06/17 0550  BP: 119/75 (!) 106/50  Pulse: (!) 104 77  Resp: (!) 24 20  Temp: (!) 101.2 F (38.4 C) 98.7 F (37.1 C)  SpO2: 93%    Wt Readings from Last 3 Encounters:  10/06/17 102.1 kg (225 lb)  09/23/17 110.2 kg (242 lb 14.4 oz)    General: Pleasant, obese, comfortable, well appearing Hispanic female.  Head: No facial asymmetry or swelling. Eyes: No scleral icterus.  No conjunctival pallor. Ears: Not hard of hearing Nose: No discharge or congestion Mouth: Moist, clear, pink oral mucosa.  Tongue midline.  Good dentition. Neck: No JVD, no masses, no thyromegaly. Lungs: Clear bilaterally.  No labored breathing.  No cough. Heart: RRR.  No MRG.  S1, S2 present Abdomen: Obese, soft.  Tender throughout but predominantly in the epigastrium and upper abdomen.  No guarding or rebound.  Bowel sounds inactive.   Rectal: Deferred Musc/Skeltl: No obvious joint swelling or deformity. Extremities: No CCE. Neurologic: Alert.  Oriented x3.  No limb weakness or tremor.  No gross deficits. Skin: No rash.  No sores, no jaundice. Tattoos: None Nodes: No cervical or inguinal adenopathy. Psych: Pleasant, calm, cooperative.   LAB RESULTS: Recent Labs    10/05/17 1746 10/06/17 0434  WBC 6.3 8.3  HGB 13.0 12.5  HCT 36.5 36.4  PLT 222 193   BMET Lab Results  Component Value Date   NA 136 10/06/2017   NA 138 10/05/2017   NA 140 09/23/2017   K 3.5 10/06/2017   K 3.4 (L) 10/05/2017   K 3.3 (L) 09/23/2017   CL 106 10/06/2017   CL 106 10/05/2017   CL 108 09/23/2017   CO2 21 (L) 10/06/2017   CO2 24 10/05/2017   CO2 27 09/23/2017   GLUCOSE 128 (H) 10/06/2017   GLUCOSE 135 (H) 10/05/2017   GLUCOSE 106 (H) 09/23/2017   BUN 5 (L) 10/06/2017   BUN 8 10/05/2017   BUN 17 09/23/2017   CREATININE 0.54 10/06/2017   CREATININE 0.65 10/05/2017    CREATININE 0.61 09/23/2017   CALCIUM 8.5 (L) 10/06/2017   CALCIUM 8.8 (L) 10/05/2017   CALCIUM 9.1 09/23/2017   LFT Recent Labs    10/05/17 1746 10/06/17 0434  PROT 7.2 6.7  ALBUMIN 3.8 3.5  AST 194* 228*  ALT 193* 284*  ALKPHOS 116 106  BILITOT 1.4* 1.5*  Lipase     Component Value Date/Time   LIPASE 38 10/05/2017 1746    Drugs of Abuse  No results found for: LABOPIA, COCAINSCRNUR, LABBENZ, AMPHETMU, THCU, LABBARB   RADIOLOGY STUDIES: US Abdomen Complete  Result Date: 10/05/2017 CLINICAL DATA:  Dilated common bile duct EXAM: ABDOMEN ULTRASOUND COMPLETE COMPARISON:  CT abdomen and pelvis October 05, 2017 FINDINGS: Gallbladder: Surgically absent. Common bile duct: Diameter: 13 mm, dilated. No biliary duct mass or calculus is appreciable by ultrasound. No intrahepatic biliary duct dilatation evident. Liver: No focal lesion identified. Within normal limits in parenchymal echogenicity. Portal vein is patent on color Doppler imaging with normal direction of blood flow towards the liver. IVC: No abnormality visualized. Pancreas: Visualized portion unremarkable. Much of pancreas obscured by gas. Spleen: Size and appearance within normal limits. Right Kidney: Length: 12.4 cm. Echogenicity within normal limits. No mass or hydronephrosis visualized. Left Kidney: Length: 13.0 cm. Echogenicity within normal limits. No mass or hydronephrosis visualized. Abdominal aorta: No aneurysm visualized. Other findings: No demonstrable ascites. IMPRESSION: 1. Gallbladder absent. Dilated common bile duct without biliary duct mass or calculus appreciable by ultrasound. 2. Portions of pancreas obscured by gas. Visualized portions of pancreas appear normal. 3.  Study otherwise unremarkable. Electronically Signed   By: Lowella Grip III M.D.   On: 10/05/2017 23:50   Ct Abdomen Pelvis W Contrast  Result Date: 10/05/2017 CLINICAL DATA:  Progressively worsening abdominal pain over the past year. Associated  nausea and vomiting. EXAM: CT ABDOMEN AND PELVIS WITH CONTRAST TECHNIQUE:  Multidetector CT imaging of the abdomen and pelvis was performed using the standard protocol following bolus administration of intravenous contrast. CONTRAST:  100 mL ISOVUE-300 IOPAMIDOL (ISOVUE-300) INJECTION 61% COMPARISON:  CT abdomen pelvis dated September 24, 2017. FINDINGS: Lower chest: No acute abnormality. Hepatobiliary: No focal liver abnormality. Prior cholecystectomy. Trace pneumobilia, decreased when compared to prior study. Stable dilatation of the common bile duct. Pancreas: Unremarkable. No pancreatic ductal dilatation or surrounding inflammatory changes. Spleen: Normal in size without focal abnormality. Adrenals/Urinary Tract: Adrenal glands are unremarkable. Kidneys are normal, without renal calculi, focal lesion, or hydronephrosis. Bladder is unremarkable. Stomach/Bowel: Stomach is within normal limits. Tiny appendicolith near the tip of the appendix is unchanged. The appendix is otherwise unremarkable, without dilatation, wall thickening, or surrounding inflammatory changes. No evidence of bowel wall thickening, distention, or inflammatory changes. Vascular/Lymphatic: No significant vascular findings are present. No enlarged abdominal or pelvic lymph nodes. Reproductive: Interval increase in size of a low-density cystic lesion within the left ovary, containing a thin septation. The uterus and right ovary are unremarkable. Other: No free fluid or pneumoperitoneum. Musculoskeletal: No acute or significant osseous findings. IMPRESSION: 1.  No acute intra-abdominal process. 2. Slight interval increase in size of a septated left ovarian cyst, measuring up to 7.8 cm. While this lesion is probably benign, given its size, non-emergent pelvic ultrasound is recommended for further characterization. This recommendation follows ACR consensus guidelines: White Paper of the ACR Incidental Findings Committee II on Adnexal Findings. J Am  Coll Radiol [phone]. Electronically Signed   By: Titus Dubin M.D.   On: 10/05/2017 22:04   Mr 3d Recon At Scanner  Result Date: 10/06/2017 CLINICAL DATA:  Patient status post cholecystectomy now with severe abdominal pain and nausea. Pain worse after eating. EXAM: MRI ABDOMEN WITHOUT AND WITH CONTRAST (INCLUDING MRCP) TECHNIQUE: Multiplanar multisequence MR imaging of the abdomen was performed both before and after the administration of intravenous contrast. Heavily T2-weighted images of the biliary and pancreatic ducts were obtained, and three-dimensional MRCP images were rendered by post processing. CONTRAST:  61mL MULTIHANCE GADOBENATE DIMEGLUMINE 529 MG/ML IV SOLN COMPARISON:  CT abdomen pelvis 10/05/2017; right upper quadrant ultrasound 10/05/2017. FINDINGS: Lower chest: No acute findings. Hepatobiliary: Liver is normal in size and contour. Patient status post cholecystectomy. No focal hepatic lesions are identified. There is intrahepatic and extrahepatic biliary ductal dilatation. The common bile duct is dilated measuring 16 mm (image 21; series 5). There is a 10 mm filling defect within the distal common bile duct (image 26; series 5) most compatible distal CBD stone. Pancreas: Unremarkable in appearance. No pancreatic ductal dilatation. No surrounding inflammatory stranding. Spleen:  Unremarkable Adrenals/Urinary Tract: Normal adrenal glands. Kidneys are symmetric in size. No hydronephrosis. 7 mm too small to characterize T2 bright nonenhancing lesion interpolar region right kidney, favored to represent a small cyst. Stomach/Bowel: Visualized stomach, small bowel and colon are unremarkable. No evidence for bowel obstruction. No free fluid. Vascular/Lymphatic: Normal caliber abdominal aorta. No retroperitoneal lymphadenopathy. Other:  None. Musculoskeletal: No aggressive or acute appearing osseous lesions. IMPRESSION: There is a 10 mm distal CBD stone which results in marked dilatation of the  common bile duct (16 mm) and intrahepatic biliary ductal dilatation. Patient status post cholecystectomy. Findings were documented at time of examination by the on call radiologist. Electronically Signed   By: Lovey Newcomer M.D.   On: 10/06/2017 08:36   Mr Abdomen Mrcp Moise Boring Contast  Result Date: 10/06/2017 CLINICAL DATA:  Patient status post cholecystectomy now with severe abdominal  pain and nausea. Pain worse after eating. EXAM: MRI ABDOMEN WITHOUT AND WITH CONTRAST (INCLUDING MRCP) TECHNIQUE: Multiplanar multisequence MR imaging of the abdomen was performed both before and after the administration of intravenous contrast. Heavily T2-weighted images of the biliary and pancreatic ducts were obtained, and three-dimensional MRCP images were rendered by post processing. CONTRAST:  36mL MULTIHANCE GADOBENATE DIMEGLUMINE 529 MG/ML IV SOLN COMPARISON:  CT abdomen pelvis 10/05/2017; right upper quadrant ultrasound 10/05/2017. FINDINGS: Lower chest: No acute findings. Hepatobiliary: Liver is normal in size and contour. Patient status post cholecystectomy. No focal hepatic lesions are identified. There is intrahepatic and extrahepatic biliary ductal dilatation. The common bile duct is dilated measuring 16 mm (image 21; series 5). There is a 10 mm filling defect within the distal common bile duct (image 26; series 5) most compatible distal CBD stone. Pancreas: Unremarkable in appearance. No pancreatic ductal dilatation. No surrounding inflammatory stranding. Spleen:  Unremarkable Adrenals/Urinary Tract: Normal adrenal glands. Kidneys are symmetric in size. No hydronephrosis. 7 mm too small to characterize T2 bright nonenhancing lesion interpolar region right kidney, favored to represent a small cyst. Stomach/Bowel: Visualized stomach, small bowel and colon are unremarkable. No evidence for bowel obstruction. No free fluid. Vascular/Lymphatic: Normal caliber abdominal aorta. No retroperitoneal lymphadenopathy. Other:  None.  Musculoskeletal: No aggressive or acute appearing osseous lesions. IMPRESSION: There is a 10 mm distal CBD stone which results in marked dilatation of the common bile duct (16 mm) and intrahepatic biliary ductal dilatation. Patient status post cholecystectomy. Findings were documented at time of examination by the on call radiologist. Electronically Signed   By: Lovey Newcomer M.D.   On: 10/06/2017 08:36     IMPRESSION:   *   Choledocholithiasis, symptomatic with pain and rising LFTs.  *    Left ovarian cyst.  Pelvic ultrasound plan for today will need to be postponed or question if this can be pursued while she is here in the hospital?  *   Limited hematemesis over the weekend, 6 days ago.  Not recurrent.     PLAN:     *   Will need ERCP (Endoscopicretrogradecholangiopancreatography)  *  Has already been receiving Cipro and Flagyl IV.     Azucena Freed  10/06/2017, 10:16 AM Pager: [phone]    [facility] Attending   I have taken an interval history, reviewed the chart and examined the patient. I agree with the Advanced Practitioner's note, impression and recommendations.   Choledocholithiasis and cholangitis (I think based upon fever and scenario)  I reviewed the nature of her problem and therapeutic ERCP with her using an interpreter. All ? Were answered.The risks and benefits as well as alternatives of endoscopic procedure(s) have been discussed and reviewed. All questions answered. The patient agrees to proceed.  An NIH handout in Spanish re: ERCP was also provided.  ERCP with stone removal later today.  Gatha Mayer, MD, [facility] [phone] (pager) 10/06/2017 11:48 AM

## 2017-10-06 NOTE — H&P (Signed)
History and Physical    Colleen Arnold PJK:[id number] DOB: Feb 20, 1986 DOA: 10/05/2017  PCP: Raiford Simmonds., PA-C   Patient coming from: Home  Chief Complaint: Abd pain, N/V   HPI: Colleen Arnold is a 31 y.o. female with medical history significant for cholelithiasis now status post cholecystectomy 9 years ago and presenting to the emergency department for evaluation of severe abdominal pain with nausea and nonbloody vomiting.  Patient reports that she has been experiencing bouts of severe upper abdominal pain for over a year, worse after meals, particularly with spicy or greasy foods.  She no longer uses alcohol and has been avoiding spicy, greasy foods, but the symptoms have persisted.  She was seen in the emergency department recently after a particularly severe episode evaluation was reassuring with normal LFTs and unremarkable CT of the abdomen and pelvis.  She has been using Percocet and Zofran at home, now presents with severe symptoms despite this.  Denies chest pain, cough, or shortness of breath.  ED Course: Upon arrival to the ED, patient is found to be afebrile, saturating well on room air, and with vitals stable.  Chemistry panel is notable for AST of 194, ALT 193, and total bilirubin 1.4, previously normal.  Lipase is normal.  CBC is unremarkable.  Urinalysis unremarkable.  CT abdomen and pelvis negative for acute process, but notable for slight interval increase in left ovarian cyst with outpatient ultrasound follow-up recommended.  Right upper quadrant ultrasound reveals absent gallbladder with dilated CBD, but no mass or calculus appreciated.  Bowel gas obscures some of the pancreas on the ultrasound, but visualized portions are unremarkable.  Patient was given a liter of normal saline, Zofran, Protonix, and Carafate in the emergency department.  She continues to complain of intractable pain.  Gastroenterology was consulted and recommends admission for MRCP possible EGD.  Patient has  remained hemodynamically stable, has not been in any apparent respiratory distress, and will be admitted to the medical-surgical unit for ongoing evaluation and management of intractable abdominal pain, possibly secondary to a biliary obstruction.  Review of Systems:  All other systems reviewed and apart from HPI, are negative.  History reviewed. No pertinent past medical history.  History reviewed. No pertinent surgical history.   reports that  has never smoked. she has never used smokeless tobacco. She reports that she does not drink alcohol or use drugs.  Allergies  Allergen Reactions  . Rocephin [Ceftriaxone Sodium In Dextrose] Rash    History reviewed. No pertinent family history.   Prior to Admission medications   Medication Sig Start Date End Date Taking? Authorizing Provider  metroNIDAZOLE (FLAGYL) 500 MG tablet Take 500 mg by mouth 2 (two) times daily.   Yes [provider]  omeprazole (PRILOSEC) 20 MG capsule Take 20 mg by mouth 2 (two) times daily.   Yes [provider]  ondansetron (ZOFRAN) 4 MG tablet Take 1 tablet (4 mg total) by mouth every 8 (eight) hours as needed for nausea or vomiting. 80/9/98  Yes Delora Fuel, MD  oxyCODONE-acetaminophen (PERCOCET) 5-325 MG tablet Take 1 tablet by mouth every 4 (four) hours as needed for moderate pain. 33/8/25  Yes Delora Fuel, MD  ondansetron (ZOFRAN) 4 MG tablet Take 1 tablet (4 mg total) by mouth every 6 (six) hours as needed. Patient not taking: Reported on 05/39/7673 41/9/37   Delora Fuel, MD  oxyCODONE-acetaminophen (PERCOCET) 5-325 MG tablet Take 1 tablet by mouth every 4 (four) hours as needed for moderate pain. Patient not taking: Reported  on 35/32/9924 26/8/34   Delora Fuel, MD    Physical Exam: Vitals:   10/05/17 2045 10/05/17 2123 10/05/17 2145 10/06/17 0019  BP: 129/77 (!) 111/50  114/70  Pulse: 88 96  (!) 104  Resp:  18  20  Temp:      TempSrc:      SpO2: 100% 98%  97%  Weight:   109.8 kg  (242 lb)   Height:   5\' 3"  (1.6 m)       Constitutional: NAD, calm, in apparent discomfort Eyes: PERTLA, lids and conjunctivae normal ENMT: Mucous membranes are moist. Posterior pharynx clear of any exudate or lesions.   Neck: normal, supple, no masses, no thyromegaly Respiratory: clear to auscultation bilaterally, no wheezing, no crackles. Normal respiratory effort. No accessory muscle use.  Cardiovascular: S1 & S2 heard, regular rate and rhythm. No significant JVD. Abdomen: No distension, soft, mild tenderness, no rebound pain or guarding. Bowel sounds active.  Musculoskeletal: no clubbing / cyanosis. No joint deformity upper and lower extremities.   Skin: no significant rashes, lesions, ulcers. Warm, dry, well-perfused. Neurologic: CN 2-12 grossly intact. Sensation intact. Strength 5/5 in all 4 limbs.  Psychiatric: Alert and oriented x 3. Calm.     Labs on Admission: I have personally reviewed following labs and imaging studies  CBC: Recent Labs  Lab 10/05/17 1746  WBC 6.3  HGB 13.0  HCT 36.5  MCV 87.5  PLT 196   Basic Metabolic Panel: Recent Labs  Lab 10/05/17 1746  NA 138  K 3.4*  CL 106  CO2 24  GLUCOSE 135*  BUN 8  CREATININE 0.65  CALCIUM 8.8*   GFR: Estimated Creatinine Clearance: 122.4 mL/min (by C-G formula based on SCr of 0.65 mg/dL). Liver Function Tests: Recent Labs  Lab 10/05/17 1746  AST 194*  ALT 193*  ALKPHOS 116  BILITOT 1.4*  PROT 7.2  ALBUMIN 3.8   Recent Labs  Lab 10/05/17 1746  LIPASE 38   No results for input(s): AMMONIA in the last 168 hours. Coagulation Profile: No results for input(s): INR, PROTIME in the last 168 hours. Cardiac Enzymes: No results for input(s): CKTOTAL, CKMB, CKMBINDEX, TROPONINI in the last 168 hours. BNP (last 3 results) No results for input(s): PROBNP in the last 8760 hours. HbA1C: No results for input(s): HGBA1C in the last 72 hours. CBG: No results for input(s): GLUCAP in the last 168  hours. Lipid Profile: No results for input(s): CHOL, HDL, LDLCALC, TRIG, CHOLHDL, LDLDIRECT in the last 72 hours. Thyroid Function Tests: No results for input(s): TSH, T4TOTAL, FREET4, T3FREE, THYROIDAB in the last 72 hours. Anemia Panel: No results for input(s): VITAMINB12, FOLATE, FERRITIN, TIBC, IRON, RETICCTPCT in the last 72 hours. Urine analysis:    Component Value Date/Time   COLORURINE YELLOW 10/05/2017 1746   APPEARANCEUR CLOUDY (A) 10/05/2017 1746   LABSPEC 1.012 10/05/2017 1746   PHURINE 7.0 10/05/2017 1746   GLUCOSEU NEGATIVE 10/05/2017 1746   HGBUR LARGE (A) 10/05/2017 1746   BILIRUBINUR NEGATIVE 10/05/2017 1746   KETONESUR 20 (A) 10/05/2017 1746   PROTEINUR NEGATIVE 10/05/2017 1746   NITRITE NEGATIVE 10/05/2017 1746   LEUKOCYTESUR NEGATIVE 10/05/2017 1746   Sepsis Labs: @LABRCNTIP (procalcitonin:4,lacticidven:4) )No results found for this or any previous visit (from the past 240 hour(s)).   Radiological Exams on Admission: US Abdomen Complete  Result Date: 10/05/2017 CLINICAL DATA:  Dilated common bile duct EXAM: ABDOMEN ULTRASOUND COMPLETE COMPARISON:  CT abdomen and pelvis October 05, 2017 FINDINGS: Gallbladder: Surgically absent. Common bile  duct: Diameter: 13 mm, dilated. No biliary duct mass or calculus is appreciable by ultrasound. No intrahepatic biliary duct dilatation evident. Liver: No focal lesion identified. Within normal limits in parenchymal echogenicity. Portal vein is patent on color Doppler imaging with normal direction of blood flow towards the liver. IVC: No abnormality visualized. Pancreas: Visualized portion unremarkable. Much of pancreas obscured by gas. Spleen: Size and appearance within normal limits. Right Kidney: Length: 12.4 cm. Echogenicity within normal limits. No mass or hydronephrosis visualized. Left Kidney: Length: 13.0 cm. Echogenicity within normal limits. No mass or hydronephrosis visualized. Abdominal aorta: No aneurysm visualized.  Other findings: No demonstrable ascites. IMPRESSION: 1. Gallbladder absent. Dilated common bile duct without biliary duct mass or calculus appreciable by ultrasound. 2. Portions of pancreas obscured by gas. Visualized portions of pancreas appear normal. 3.  Study otherwise unremarkable. Electronically Signed   By: Lowella Grip III M.D.   On: 10/05/2017 23:50   Ct Abdomen Pelvis W Contrast  Result Date: 10/05/2017 CLINICAL DATA:  Progressively worsening abdominal pain over the past year. Associated nausea and vomiting. EXAM: CT ABDOMEN AND PELVIS WITH CONTRAST TECHNIQUE: Multidetector CT imaging of the abdomen and pelvis was performed using the standard protocol following bolus administration of intravenous contrast. CONTRAST:  100 mL ISOVUE-300 IOPAMIDOL (ISOVUE-300) INJECTION 61% COMPARISON:  CT abdomen pelvis dated September 24, 2017. FINDINGS: Lower chest: No acute abnormality. Hepatobiliary: No focal liver abnormality. Prior cholecystectomy. Trace pneumobilia, decreased when compared to prior study. Stable dilatation of the common bile duct. Pancreas: Unremarkable. No pancreatic ductal dilatation or surrounding inflammatory changes. Spleen: Normal in size without focal abnormality. Adrenals/Urinary Tract: Adrenal glands are unremarkable. Kidneys are normal, without renal calculi, focal lesion, or hydronephrosis. Bladder is unremarkable. Stomach/Bowel: Stomach is within normal limits. Tiny appendicolith near the tip of the appendix is unchanged. The appendix is otherwise unremarkable, without dilatation, wall thickening, or surrounding inflammatory changes. No evidence of bowel wall thickening, distention, or inflammatory changes. Vascular/Lymphatic: No significant vascular findings are present. No enlarged abdominal or pelvic lymph nodes. Reproductive: Interval increase in size of a low-density cystic lesion within the left ovary, containing a thin septation. The uterus and right ovary are unremarkable.  Other: No free fluid or pneumoperitoneum. Musculoskeletal: No acute or significant osseous findings. IMPRESSION: 1.  No acute intra-abdominal process. 2. Slight interval increase in size of a septated left ovarian cyst, measuring up to 7.8 cm. While this lesion is probably benign, given its size, non-emergent pelvic ultrasound is recommended for further characterization. This recommendation follows ACR consensus guidelines: White Paper of the ACR Incidental Findings Committee II on Adnexal Findings. J Am Coll Radiol [phone]. Electronically Signed   By: Titus Dubin M.D.   On: 10/05/2017 22:04    EKG: Not performed.   Assessment/Plan   1. Intractable abdominal pain with N/V, elevated LFT's  - Pt presents with close to a yr of progressive upper abdominal pain, worse after meal, associated with nausea and vomiting  - She has worsening sxs despite Percocet and Zofran at home  - CT abd/pelvis is notable for ovarian cyst, o/w unremarkable - RUQ [place] with absent gallbladder, CBD dilation without stone or mass identified  - Labs with new elevations in AST and ALT to 190, normal lipase, slight t bili elevation at 1.4  - She continues to complain of severe pain after multiple doses of IV Dilaudid in ED  - GI consulting and much appreciated, recommends admission for MRCP and possible EGD  - Keep NPO, continue supportive care  with IVF hydration, analgesia, antiemetics, monitor lytes   2. Hypokalemia  - Serum potassium is 3.4 on admission  - KCl added to IVF  - Repeat chem panel in am   3. Ovarian cyst  - Noted incidentally on CT and probably benign  - Outpatient follow-up with [place] recommended    DVT prophylaxis: Lovenox Code Status: Full  Family Communication: Discussed with patient Disposition Plan: Admit to med-surg Consults called: Gastroenterology Admission status: Inpatient    Vianne Bulls, MD [facility] Pager [phone]  If 7PM-7AM, please contact  night-coverage [email or web address] Password [email or web address]  10/06/2017, 1:26 AM

## 2017-10-06 NOTE — Anesthesia Postprocedure Evaluation (Signed)
Anesthesia Post Note  Patient: Andreas Blower  Procedure(s) Performed: ENDOSCOPIC RETROGRADE CHOLANGIOPANCREATOGRAPHY (ERCP) (N/A )     Patient location during evaluation: PACU Anesthesia Type: General Level of consciousness: awake and alert Pain management: pain level controlled Vital Signs Assessment: post-procedure vital signs reviewed and stable Respiratory status: spontaneous breathing, nonlabored ventilation, respiratory function stable and patient connected to nasal cannula oxygen Cardiovascular status: blood pressure returned to baseline and stable Postop Assessment: no apparent nausea or vomiting Anesthetic complications: no    Last Vitals:  Vitals:   10/06/17 1430 10/06/17 1440  BP: (!) 143/92 139/81  Pulse: 77 67  Resp: 20 19  Temp:    SpO2: 100% 100%    Last Pain:  Vitals:   10/06/17 1423  TempSrc: Oral  PainSc:                  Mykelle Cockerell P Asiana Benninger

## 2017-10-06 NOTE — Anesthesia Procedure Notes (Signed)
Procedure Name: Intubation Date/Time: 10/06/2017 1:36 PM Performed by: Myna Bright, CRNA Pre-anesthesia Checklist: Patient identified, Suction available, Emergency Drugs available and Patient being monitored Patient Re-evaluated:Patient Re-evaluated prior to induction Oxygen Delivery Method: Circle system utilized Preoxygenation: Pre-oxygenation with 100% oxygen Induction Type: IV induction Ventilation: Mask ventilation without difficulty Laryngoscope Size: Mac and 3 Grade View: Grade I Tube type: Oral Tube size: 7.0 mm Number of attempts: 1 Airway Equipment and Method: Stylet Placement Confirmation: ETT inserted through vocal cords under direct vision,  positive ETCO2 and breath sounds checked- equal and bilateral Secured at: 21 cm Tube secured with: Tape Dental Injury: Teeth and Oropharynx as per pre-operative assessment

## 2017-10-06 NOTE — Anesthesia Preprocedure Evaluation (Addendum)
Anesthesia Evaluation  Patient identified by MRN, date of birth, ID band Patient awake    Reviewed: Allergy & Precautions, NPO status , Patient's Chart, lab work & pertinent test results  Airway Mallampati: IV  TM Distance: >3 FB Neck ROM: Full    Dental no notable dental hx.    Pulmonary neg pulmonary ROS,    Pulmonary exam normal breath sounds clear to auscultation       Cardiovascular negative cardio ROS Normal cardiovascular exam Rhythm:Regular Rate:Normal     Neuro/Psych negative neurological ROS  negative psych ROS   GI/Hepatic negative GI ROS, Neg liver ROS,   Endo/Other  Morbid obesity  Renal/GU negative Renal ROS     Musculoskeletal negative musculoskeletal ROS (+)   Abdominal (+) + obese,   Peds  Hematology negative hematology ROS (+)   Anesthesia Other Findings choledocholithiasis  Reproductive/Obstetrics hcg negative Repeat hcg offered to and declined by patient                            Anesthesia Physical Anesthesia Plan  ASA: II  Anesthesia Plan: General   Post-op Pain Management:    Induction: Intravenous  PONV Risk Score and Plan: 3 and Midazolam, Dexamethasone, Ondansetron and Treatment may vary due to age or medical condition  Airway Management Planned: Oral ETT  Additional Equipment:   Intra-op Plan:   Post-operative Plan: Extubation in OR  Informed Consent: I have reviewed the patients History and Physical, chart, labs and discussed the procedure including the risks, benefits and alternatives for the proposed anesthesia with the patient or authorized representative who has indicated his/her understanding and acceptance.   Dental advisory given  Plan Discussed with: CRNA  Anesthesia Plan Comments:         Anesthesia Quick Evaluation

## 2017-10-06 NOTE — Transfer of Care (Signed)
Immediate Anesthesia Transfer of Care Note  Patient: Colleen Arnold  Procedure(s) Performed: ENDOSCOPIC RETROGRADE CHOLANGIOPANCREATOGRAPHY (ERCP) (N/A )  Patient Location: Endoscopy Unit  Anesthesia Type:General  Level of Consciousness: awake, alert , oriented and patient cooperative  Airway & Oxygen Therapy: Patient Spontanous Breathing and Patient connected to nasal cannula oxygen  Post-op Assessment: Report given to RN, Post -op Vital signs reviewed and stable and Patient moving all extremities  Post vital signs: Reviewed and stable  Last Vitals:  Vitals:   10/06/17 1420 10/06/17 1423  BP:  (!) (P) 146/89  Pulse:  83  Resp:  16  Temp: 36.9 C 37.1 C  SpO2:  99%    Last Pain:  Vitals:   10/06/17 1423  TempSrc: Oral  PainSc:       Patients Stated Pain Goal: 3 (42/59/56 3875)  Complications: No apparent anesthesia complications

## 2017-10-07 DIAGNOSIS — R1013 Epigastric pain: Secondary | ICD-10-CM

## 2017-10-07 DIAGNOSIS — K8051 Calculus of bile duct without cholangitis or cholecystitis with obstruction: Secondary | ICD-10-CM

## 2017-10-07 DIAGNOSIS — K831 Obstruction of bile duct: Secondary | ICD-10-CM

## 2017-10-07 LAB — HEPATITIS PANEL, ACUTE
HCV Ab: 0.1 s/co ratio (ref 0.0–0.9)
HEP B S AG: NEGATIVE
Hep A IgM: NEGATIVE
Hep B C IgM: NEGATIVE

## 2017-10-07 LAB — COMPREHENSIVE METABOLIC PANEL
ALBUMIN: 3.1 g/dL — AB (ref 3.5–5.0)
ALT: 212 U/L — ABNORMAL HIGH (ref 14–54)
ANION GAP: 8 (ref 5–15)
AST: 80 U/L — AB (ref 15–41)
Alkaline Phosphatase: 90 U/L (ref 38–126)
BILIRUBIN TOTAL: 0.5 mg/dL (ref 0.3–1.2)
BUN: 7 mg/dL (ref 6–20)
CHLORIDE: 108 mmol/L (ref 101–111)
CO2: 24 mmol/L (ref 22–32)
Calcium: 8.8 mg/dL — ABNORMAL LOW (ref 8.9–10.3)
Creatinine, Ser: 0.6 mg/dL (ref 0.44–1.00)
GFR calc Af Amer: >60 mL/min (ref >60)
GFR calc non Af Amer: >60 mL/min (ref >60)
GLUCOSE: 96 mg/dL (ref 65–99)
POTASSIUM: 3.5 mmol/L (ref 3.5–5.1)
SODIUM: 140 mmol/L (ref 135–145)
Total Protein: 6.8 g/dL (ref 6.5–8.1)

## 2017-10-07 LAB — CBC
HEMATOCRIT: 34.9 % — AB (ref 36.0–46.0)
Hemoglobin: 12 g/dL (ref 12.0–15.0)
MCH: 30.8 pg (ref 26.0–34.0)
MCHC: 34.4 g/dL (ref 30.0–36.0)
MCV: 89.5 fL (ref 78.0–100.0)
PLATELETS: 182 10*3/uL (ref 150–400)
RBC: 3.9 MIL/uL (ref 3.87–5.11)
RDW: 12.6 % (ref 11.5–15.5)
WBC: 5.6 10*3/uL (ref 4.0–10.5)

## 2017-10-07 LAB — GLUCOSE, CAPILLARY: GLUCOSE-CAPILLARY: 98 mg/dL (ref 65–99)

## 2017-10-07 LAB — LIPASE, BLOOD: Lipase: 33 U/L (ref 11–51)

## 2017-10-07 MED ORDER — DOCUSATE SODIUM 100 MG PO CAPS
100.0000 mg | ORAL_CAPSULE | Freq: Two times a day (BID) | ORAL | Status: DC
  Administered 2017-10-07: 100 mg via ORAL
  Filled 2017-10-07: qty 1

## 2017-10-07 MED ORDER — POLYETHYLENE GLYCOL 3350 17 G PO PACK: 17.0000 g | PACK | Freq: Every day | ORAL | 0 refills | Status: DC

## 2017-10-07 MED ORDER — DOCUSATE SODIUM 100 MG PO CAPS: 100.0000 mg | ORAL_CAPSULE | Freq: Two times a day (BID) | ORAL | 0 refills | Status: DC

## 2017-10-07 MED ORDER — POLYETHYLENE GLYCOL 3350 17 G PO PACK
17.0000 g | PACK | Freq: Every day | ORAL | Status: DC
  Administered 2017-10-07: 17 g via ORAL
  Filled 2017-10-07: qty 1

## 2017-10-07 MED ORDER — HYDROCODONE-ACETAMINOPHEN 5-325 MG PO TABS: 1.0000 | ORAL_TABLET | Freq: Four times a day (QID) | ORAL | 0 refills | Status: DC | PRN

## 2017-10-07 MED ORDER — HYDROCODONE-ACETAMINOPHEN 5-325 MG PO TABS: 1.0000 | ORAL_TABLET | Freq: Four times a day (QID) | ORAL | Status: DC | PRN

## 2017-10-07 MED ORDER — PHENOL 1.4 % MT LIQD: 1.0000 | OROMUCOSAL | Status: DC | PRN

## 2017-10-07 NOTE — Progress Notes (Signed)
Pt given discharge instructions on meds, f/u appmts, self care, & diet. All questions answered & prescriptions given. All belongings gathered & taken with pt. Hoover Brunette, RN

## 2017-10-07 NOTE — Progress Notes (Addendum)
   Patient Name: Colleen Arnold Date of Encounter: 10/07/2017, 9:08 AM    Subjective  Mild sore throat and upper back and chest wall pain    Objective  BP (!) 108/59 (BP Location: Left Arm)   Pulse 74   Temp 98.2 F (36.8 C) (Oral)   Resp 18   Ht 5\' 3"  (1.6 m)   Wt 225 lb (102.1 kg)   LMP 09/09/2017   SpO2 98%   BMI 39.86 kg/m  NAD Abdomen is soft and not tender   lipase and CBC NL LFT's improved   Assessment and Plan  Choledocholithiasis w/ biliary obstruction - treated w/ ERCP no sign of post-procedure complications She is improved  She seems a bit anxious about all of this Reassured as best I could  Sore throat and mild upper trunk pains from ETT, and I think she is sore from vomiting pre hospital (mm strain)   She can go home today OK to use some NSAID's if needed She reports percocet from earlier ED visit causes nausea Severe pain should be gone  Does NOT need Abx    My office will contact her about a follow-up visit and lab testing to make sure LFT's normalize  Reg diet on dc ok   Gatha Mayer, MD, [facility] [phone] (pager) 10/07/2017 9:08 AM

## 2017-10-07 NOTE — Discharge Summary (Signed)
Physician Discharge Summary  Colleen Arnold XLK:[id number] DOB: 04-Dec-1985 DOA: 10/05/2017  PCP: Colleen Macadamia D., PA-C  Admit date: 10/05/2017 Discharge date: 10/07/2017  Time spent: 35 minutes  Recommendations for Outpatient Follow-up:  1. Repeat Sinemet to follow electrolytes, LFTs and renal function. 2. Follow symptoms resolution 3. Patient will follow up with GI service as an outpatient (office will set up that appointment for her). 4. Assist/help patient with weight loss. 5. Outpatient follow-up for further evaluation and treatment as needed of ovarian cyst.   Discharge Diagnoses:  Principal Problem:   Biliary obstruction Active Problems:   Intractable abdominal pain   LFT elevation   Ovarian cyst   Choledocholithiasis with obstruction   Epigastric pain Obesity Class 2/morbid obesity.  Discharge Condition: Stable and improved.  Patient discharged home with instruction to follow-up with her PCP in 10 days and to follow-up with gastroenterology service (Dr. Carlean Purl office will contact care in the next week or so to set up her follow-up appointment).  Diet recommendation: Low calorie and low-fat diet.  Filed Weights   10/05/17 2145 10/06/17 0224  Weight: 109.8 kg (242 lb) 102.1 kg (225 lb)    History of present illness:  As per H&P written by Dr. Mitzi Hansen on 10/06/17. 31 y.o. female with medical history significant for cholelithiasis now status post cholecystectomy 9 years ago and presenting to the emergency department for evaluation of severe abdominal pain with nausea and nonbloody vomiting.  Patient reports that she has been experiencing bouts of severe upper abdominal pain for over a year, worse after meals, particularly with spicy or greasy foods.  She no longer uses alcohol and has been avoiding spicy, greasy foods, but the symptoms have persisted.  She was seen in the emergency department recently after a particularly severe episode evaluation was reassuring with  normal LFTs and unremarkable CT of the abdomen and pelvis.  She has been using Percocet and Zofran at home, now presents with severe symptoms despite this.  Denies chest pain, cough, or shortness of breath.   Hospital Course:  1-intractable abdominal pain with nausea/vomiting and elevated LFTs: Secondary to choledocholithiasis with biliary obstruction: -Patient denies nausea and vomiting -No abdominal pain but having some throat and mid upper chest discomfort (most likely secondary to endoscopic procedure irritation). -Excellent response to treatment with ERCP -LFTs trending down and normal lipase -Patient advised to follow low-fat diet -Continue to use PPI -No further fever appreciated. -No need for antibiotics at discharge as per GI recommendations.  2-GERD -continue PPI  3-obesity: Class 2 -Body mass index is 39.86 kg/m. -Patient advised to follow a low-calorie diet and to increase physical activity to accomplish weight loss.  4-hypokalemia -Secondary to GI losses with vomiting and poor oral intake -Repleted -Recommended repeat blood work at follow-up visit to assess electrolytes trend.  5-Ovarian cyst  - Noted incidentally on CT and probably benign  - Outpatient follow-up with [place] recommended   6-constipation: Most likely in the setting of narcotic pain medication prescribed for abdominal discomfort. -Instructed to increase fiber in her diet and to keep herself well-hydrated -Trial course of Colace and MiraLAX have been provided   Procedures:  See below for x-ray reports  ERCP (10/06/17).  Consultations:  GI service  Discharge Exam: Vitals:   10/06/17 2024 10/07/17 0455  BP: 128/83 (!) 108/59  Pulse: 77 74  Resp: 18 18  Temp: 97.7 F (36.5 C) 98.2 F (36.8 C)  SpO2: 97% 98%    General: Afebrile, no nausea, no vomiting,  no abdominal pain.  Patient feeling much better and in no acute distress.  Able to tolerate diet prior to discharge. Cardiovascular: S1  and S2, no rubs, no gallops Respiratory: Clear to auscultation bilaterally Abdomen: Soft, nontender, nondistended, positive bowel sounds Extremities: No edema, no cyanosis or clubbing.  Discharge Instructions   Discharge Instructions    Discharge instructions   Complete by:  As directed    Follow low fat diet Take medications as prescribed  Keep yourself well hydrated Follow up with Dr. Carlean Purl as instructed (office will contact you with appointment details)     Allergies as of 10/07/2017      Reactions   Rocephin [ceftriaxone Sodium In Dextrose] Rash      Medication List    STOP taking these medications   metroNIDAZOLE 500 MG tablet Commonly known as:  FLAGYL   oxyCODONE-acetaminophen 5-325 MG tablet Commonly known as:  PERCOCET     TAKE these medications   docusate sodium 100 MG capsule Commonly known as:  COLACE Take 1 capsule (100 mg total) by mouth 2 (two) times daily.   HYDROcodone-acetaminophen 5-325 MG tablet Commonly known as:  NORCO/VICODIN Take 1-2 tablets by mouth every 6 (six) hours as needed for severe pain.   omeprazole 20 MG capsule Commonly known as:  PRILOSEC Take 20 mg by mouth 2 (two) times daily.   ondansetron 4 MG tablet Commonly known as:  ZOFRAN Take 1 tablet (4 mg total) by mouth every 8 (eight) hours as needed for nausea or vomiting. What changed:  Another medication with the same name was removed. Continue taking this medication, and follow the directions you see here.   polyethylene glycol packet Commonly known as:  MIRALAX / GLYCOLAX Take 17 g by mouth daily. Start taking on:  10/08/2017      Allergies  Allergen Reactions  . Rocephin [Ceftriaxone Sodium In Dextrose] Rash   Follow-up Information    Muse, Noel Journey., PA-C. Schedule an appointment as soon as possible for a visit in 10 day(s).   Contact information: [address] [place] [address] [phone]        Colleen Mayer, MD. Schedule an  appointment as soon as possible for a visit today.   Specialty:  Gastroenterology Why:  office will contact you with appointment details Contact information: [address] [place] [address] [phone]            The results of significant diagnostics from this hospitalization (including imaging, microbiology, ancillary and laboratory) are listed below for reference.    Significant Diagnostic Studies: US Abdomen Complete  Result Date: 10/05/2017 CLINICAL DATA:  Dilated common bile duct EXAM: ABDOMEN ULTRASOUND COMPLETE COMPARISON:  CT abdomen and pelvis October 05, 2017 FINDINGS: Gallbladder: Surgically absent. Common bile duct: Diameter: 13 mm, dilated. No biliary duct mass or calculus is appreciable by ultrasound. No intrahepatic biliary duct dilatation evident. Liver: No focal lesion identified. Within normal limits in parenchymal echogenicity. Portal vein is patent on color Doppler imaging with normal direction of blood flow towards the liver. IVC: No abnormality visualized. Pancreas: Visualized portion unremarkable. Much of pancreas obscured by gas. Spleen: Size and appearance within normal limits. Right Kidney: Length: 12.4 cm. Echogenicity within normal limits. No mass or hydronephrosis visualized. Left Kidney: Length: 13.0 cm. Echogenicity within normal limits. No mass or hydronephrosis visualized. Abdominal aorta: No aneurysm visualized. Other findings: No demonstrable ascites. IMPRESSION: 1. Gallbladder absent. Dilated common bile duct without biliary duct mass or calculus appreciable  by ultrasound. 2. Portions of pancreas obscured by gas. Visualized portions of pancreas appear normal. 3.  Study otherwise unremarkable. Electronically Signed   By: Lowella Grip III M.D.   On: 10/05/2017 23:50   Ct Abdomen Pelvis W Contrast  Result Date: 10/05/2017 CLINICAL DATA:  Progressively worsening abdominal pain over the past year. Associated nausea and vomiting. EXAM: CT ABDOMEN  AND PELVIS WITH CONTRAST TECHNIQUE: Multidetector CT imaging of the abdomen and pelvis was performed using the standard protocol following bolus administration of intravenous contrast. CONTRAST:  100 mL ISOVUE-300 IOPAMIDOL (ISOVUE-300) INJECTION 61% COMPARISON:  CT abdomen pelvis dated September 24, 2017. FINDINGS: Lower chest: No acute abnormality. Hepatobiliary: No focal liver abnormality. Prior cholecystectomy. Trace pneumobilia, decreased when compared to prior study. Stable dilatation of the common bile duct. Pancreas: Unremarkable. No pancreatic ductal dilatation or surrounding inflammatory changes. Spleen: Normal in size without focal abnormality. Adrenals/Urinary Tract: Adrenal glands are unremarkable. Kidneys are normal, without renal calculi, focal lesion, or hydronephrosis. Bladder is unremarkable. Stomach/Bowel: Stomach is within normal limits. Tiny appendicolith near the tip of the appendix is unchanged. The appendix is otherwise unremarkable, without dilatation, wall thickening, or surrounding inflammatory changes. No evidence of bowel wall thickening, distention, or inflammatory changes. Vascular/Lymphatic: No significant vascular findings are present. No enlarged abdominal or pelvic lymph nodes. Reproductive: Interval increase in size of a low-density cystic lesion within the left ovary, containing a thin septation. The uterus and right ovary are unremarkable. Other: No free fluid or pneumoperitoneum. Musculoskeletal: No acute or significant osseous findings. IMPRESSION: 1.  No acute intra-abdominal process. 2. Slight interval increase in size of a septated left ovarian cyst, measuring up to 7.8 cm. While this lesion is probably benign, given its size, non-emergent pelvic ultrasound is recommended for further characterization. This recommendation follows ACR consensus guidelines: White Paper of the ACR Incidental Findings Committee II on Adnexal Findings. J Am Coll Radiol [id number].  Electronically Signed   By: Titus Dubin M.D.   On: 10/05/2017 22:04   Ct Abdomen Pelvis W Contrast  Result Date: 09/24/2017 CLINICAL DATA:  31 year old female with left-sided abdominal pain. EXAM: CT ABDOMEN AND PELVIS WITH CONTRAST TECHNIQUE: Multidetector CT imaging of the abdomen and pelvis was performed using the standard protocol following bolus administration of intravenous contrast. CONTRAST:  149mL ISOVUE-300 IOPAMIDOL (ISOVUE-300) INJECTION 61% COMPARISON:  None. FINDINGS: Lower chest: The visualized lung bases are clear. No intra-abdominal free air or free fluid. Hepatobiliary: Probable mild fatty infiltration of the liver. Cholecystectomy. There is pneumobilia. Mild dilatation of the common bile duct measuring up to 15 mm. No retained calcified stone noted in the central CBD. Pancreas: Unremarkable. No pancreatic ductal dilatation or surrounding inflammatory changes. Spleen: Normal in size without focal abnormality. Adrenals/Urinary Tract: Adrenal glands are unremarkable. Kidneys are normal, without renal calculi, focal lesion, or hydronephrosis. Bladder is unremarkable. Stomach/Bowel: There is no bowel obstruction or active inflammation. Normal appendix. Vascular/Lymphatic: The abdominal aorta and IVC appear unremarkable. No portal venous gas. There is no adenopathy. Reproductive: The uterus is anteverted and grossly unremarkable. There is a septated appearing cyst in the left ovary measuring approximately 5.7 x 5.6 x 6.3 cm. There is associated mild mass effect and indentation of the left bladder wall. The right ovary appears unremarkable. Other: None Musculoskeletal: No acute or significant osseous findings. IMPRESSION: 1. A probably septated left ovarian cyst measuring up to 6.3 cm. No associated inflammatory changes identified. Ultrasound with Doppler interrogation of the ovaries may provide better evaluation if there is clinical concern  for torsion. 2. Cholecystectomy with associated  pneumobilia. 3. No bowel obstruction or active inflammation.  Normal appendix. Electronically Signed   By: Anner Crete M.D.   On: 09/24/2017 00:41   Mr 3d Recon At Scanner  Result Date: 10/06/2017 CLINICAL DATA:  Patient status post cholecystectomy now with severe abdominal pain and nausea. Pain worse after eating. EXAM: MRI ABDOMEN WITHOUT AND WITH CONTRAST (INCLUDING MRCP) TECHNIQUE: Multiplanar multisequence MR imaging of the abdomen was performed both before and after the administration of intravenous contrast. Heavily T2-weighted images of the biliary and pancreatic ducts were obtained, and three-dimensional MRCP images were rendered by post processing. CONTRAST:  26mL MULTIHANCE GADOBENATE DIMEGLUMINE 529 MG/ML IV SOLN COMPARISON:  CT abdomen pelvis 10/05/2017; right upper quadrant ultrasound 10/05/2017. FINDINGS: Lower chest: No acute findings. Hepatobiliary: Liver is normal in size and contour. Patient status post cholecystectomy. No focal hepatic lesions are identified. There is intrahepatic and extrahepatic biliary ductal dilatation. The common bile duct is dilated measuring 16 mm (image 21; series 5). There is a 10 mm filling defect within the distal common bile duct (image 26; series 5) most compatible distal CBD stone. Pancreas: Unremarkable in appearance. No pancreatic ductal dilatation. No surrounding inflammatory stranding. Spleen:  Unremarkable Adrenals/Urinary Tract: Normal adrenal glands. Kidneys are symmetric in size. No hydronephrosis. 7 mm too small to characterize T2 bright nonenhancing lesion interpolar region right kidney, favored to represent a small cyst. Stomach/Bowel: Visualized stomach, small bowel and colon are unremarkable. No evidence for bowel obstruction. No free fluid. Vascular/Lymphatic: Normal caliber abdominal aorta. No retroperitoneal lymphadenopathy. Other:  None. Musculoskeletal: No aggressive or acute appearing osseous lesions. IMPRESSION: There is a 10 mm distal  CBD stone which results in marked dilatation of the common bile duct (16 mm) and intrahepatic biliary ductal dilatation. Patient status post cholecystectomy. Findings were documented at time of examination by the on call radiologist. Electronically Signed   By: Lovey Newcomer M.D.   On: 10/06/2017 08:36   Dg Ercp Biliary & Pancreatic Ducts  Result Date: 10/06/2017 CLINICAL DATA:  Bile duct stone. EXAM: ERCP TECHNIQUE: Multiple spot images obtained with the fluoroscopic device and submitted for interpretation post-procedure. FLUOROSCOPY TIME:  Fluoroscopy Time:  1 minutes and 28 seconds COMPARISON:  MRI 10/06/2017 FINDINGS: Filling defects in the common bile duct are compatible with stones. Dilatation of the common bile duct. Evidence for balloon sweeps for stone removal. IMPRESSION: Choledocholithiasis and stone removal. These images were submitted for radiologic interpretation only. Please see the procedural report for the amount of contrast and the fluoroscopy time utilized. Electronically Signed   By: Markus Daft M.D.   On: 10/06/2017 15:04   Mr Abdomen Mrcp Moise Boring Contast  Result Date: 10/06/2017 CLINICAL DATA:  Patient status post cholecystectomy now with severe abdominal pain and nausea. Pain worse after eating. EXAM: MRI ABDOMEN WITHOUT AND WITH CONTRAST (INCLUDING MRCP) TECHNIQUE: Multiplanar multisequence MR imaging of the abdomen was performed both before and after the administration of intravenous contrast. Heavily T2-weighted images of the biliary and pancreatic ducts were obtained, and three-dimensional MRCP images were rendered by post processing. CONTRAST:  36mL MULTIHANCE GADOBENATE DIMEGLUMINE 529 MG/ML IV SOLN COMPARISON:  CT abdomen pelvis 10/05/2017; right upper quadrant ultrasound 10/05/2017. FINDINGS: Lower chest: No acute findings. Hepatobiliary: Liver is normal in size and contour. Patient status post cholecystectomy. No focal hepatic lesions are identified. There is intrahepatic and  extrahepatic biliary ductal dilatation. The common bile duct is dilated measuring 16 mm (image 21; series 5). There is  a 10 mm filling defect within the distal common bile duct (image 26; series 5) most compatible distal CBD stone. Pancreas: Unremarkable in appearance. No pancreatic ductal dilatation. No surrounding inflammatory stranding. Spleen:  Unremarkable Adrenals/Urinary Tract: Normal adrenal glands. Kidneys are symmetric in size. No hydronephrosis. 7 mm too small to characterize T2 bright nonenhancing lesion interpolar region right kidney, favored to represent a small cyst. Stomach/Bowel: Visualized stomach, small bowel and colon are unremarkable. No evidence for bowel obstruction. No free fluid. Vascular/Lymphatic: Normal caliber abdominal aorta. No retroperitoneal lymphadenopathy. Other:  None. Musculoskeletal: No aggressive or acute appearing osseous lesions. IMPRESSION: There is a 10 mm distal CBD stone which results in marked dilatation of the common bile duct (16 mm) and intrahepatic biliary ductal dilatation. Patient status post cholecystectomy. Findings were documented at time of examination by the on call radiologist. Electronically Signed   By: Lovey Newcomer M.D.   On: 10/06/2017 08:36    Microbiology: No results found for this or any previous visit (from the past 240 hour(s)).   Labs: Basic Metabolic Panel: Recent Labs  Lab 10/05/17 1746 10/06/17 0434 10/07/17 0623  NA 138 136 140  K 3.4* 3.5 3.5  CL 106 106 108  CO2 24 21* 24  GLUCOSE 135* 128* 96  BUN 8 5* 7  CREATININE 0.65 0.54 0.60  CALCIUM 8.8* 8.5* 8.8*  MG  --  1.8  --    Liver Function Tests: Recent Labs  Lab 10/05/17 1746 10/06/17 0434 10/07/17 0623  AST 194* 228* 80*  ALT 193* 284* 212*  ALKPHOS 116 106 90  BILITOT 1.4* 1.5* 0.5  PROT 7.2 6.7 6.8  ALBUMIN 3.8 3.5 3.1*   Recent Labs  Lab 10/05/17 1746 10/07/17 0623  LIPASE 38 33   No results for input(s): AMMONIA in the last 168  hours. CBC: Recent Labs  Lab 10/05/17 1746 10/06/17 0434 10/07/17 0623  WBC 6.3 8.3 5.6  NEUTROABS  --  7.3  --   HGB 13.0 12.5 12.0  HCT 36.5 36.4 34.9*  MCV 87.5 89.4 89.5  PLT 222 193 182   Cardiac Enzymes: No results for input(s): CKTOTAL, CKMB, CKMBINDEX, TROPONINI in the last 168 hours. BNP: BNP (last 3 results) No results for input(s): BNP in the last 8760 hours.  ProBNP (last 3 results) No results for input(s): PROBNP in the last 8760 hours.  CBG: Recent Labs  Lab 10/06/17 1034 10/07/17 0814  GLUCAP 95 98       Signed:  Barton Dubois MD.  [facility] 10/07/2017, 2:05 PM

## 2017-10-08 ENCOUNTER — Encounter (HOSPITAL_COMMUNITY): Payer: Self-pay | Admitting: Internal Medicine

## 2017-10-11 LAB — CULTURE, BLOOD (ROUTINE X 2)
CULTURE: NO GROWTH
Culture: NO GROWTH
Special Requests: ADEQUATE
Special Requests: ADEQUATE

## 2017-10-18 ENCOUNTER — Ambulatory Visit (HOSPITAL_COMMUNITY)
Admission: RE | Admit: 2017-10-18 | Discharge: 2017-10-18 | Disposition: A | Discharge status: Home / Self Care | Payer: PRIVATE HEALTH INSURANCE | Source: Ambulatory Visit | Attending: Nurse Practitioner | Admitting: Nurse Practitioner

## 2017-10-18 DIAGNOSIS — R109 Unspecified abdominal pain: Secondary | ICD-10-CM | POA: Insufficient documentation

## 2017-10-18 DIAGNOSIS — N83202 Unspecified ovarian cyst, left side: Secondary | ICD-10-CM | POA: Insufficient documentation

## 2017-10-25 ENCOUNTER — Encounter (INDEPENDENT_AMBULATORY_CARE_PROVIDER_SITE_OTHER): Payer: Self-pay

## 2017-10-25 ENCOUNTER — Other Ambulatory Visit (INDEPENDENT_AMBULATORY_CARE_PROVIDER_SITE_OTHER): Payer: PRIVATE HEALTH INSURANCE

## 2017-10-25 ENCOUNTER — Ambulatory Visit: Payer: PRIVATE HEALTH INSURANCE | Admitting: Nurse Practitioner

## 2017-10-25 ENCOUNTER — Ambulatory Visit (INDEPENDENT_AMBULATORY_CARE_PROVIDER_SITE_OTHER): Payer: Self-pay | Admitting: Nurse Practitioner

## 2017-10-25 ENCOUNTER — Encounter: Payer: Self-pay | Admitting: Nurse Practitioner

## 2017-10-25 VITALS — BP 120/80 | HR 76 | Ht 63.0 in | Wt 230.0 lb

## 2017-10-25 DIAGNOSIS — K8050 Calculus of bile duct without cholangitis or cholecystitis without obstruction: Secondary | ICD-10-CM

## 2017-10-25 DIAGNOSIS — R140 Abdominal distension (gaseous): Secondary | ICD-10-CM

## 2017-10-25 DIAGNOSIS — K5900 Constipation, unspecified: Secondary | ICD-10-CM

## 2017-10-25 DIAGNOSIS — R142 Eructation: Secondary | ICD-10-CM

## 2017-10-25 DIAGNOSIS — R110 Nausea: Secondary | ICD-10-CM

## 2017-10-25 LAB — HEPATIC FUNCTION PANEL
ALT: 20 U/L (ref 0–35)
AST: 12 U/L (ref 0–37)
Albumin: 4.2 g/dL (ref 3.5–5.2)
Alkaline Phosphatase: 52 U/L (ref 39–117)
BILIRUBIN DIRECT: 0.1 mg/dL (ref 0.0–0.3)
TOTAL PROTEIN: 7.1 g/dL (ref 6.0–8.3)
Total Bilirubin: 0.5 mg/dL (ref 0.2–1.2)

## 2017-10-25 NOTE — Patient Instructions (Addendum)
If you are age 31 or older, your body mass index should be between 23-30. Your Body mass index is 40.74 kg/m. If this is out of the aforementioned range listed, please consider follow up with your Primary Care Provider.  If you are age 53 or younger, your body mass index should be between 19-25. Your Body mass index is 40.74 kg/m. If this is out of the aformentioned range listed, please consider follow up with your Primary Care Provider.   Your physician has requested that you go to the basement for the following lab work before leaving today: LFT's  STOP Miralax. Start Citrucel twice daily. (over-the-counter) Take Magnesium Citrate today.  You have been given a High Fiber Diet.  Follow up as needed.  Thank you for choosing me and [facility].   Tye Savoy, NP

## 2017-10-25 NOTE — Progress Notes (Addendum)
     Chief Complaint:  Hospital follow up  HPI: Patient is a 32 year old Spanish-speaking female who we saw in the hospital late December for abdominal pain, fever and abnormal liver labs.  Patient is here with a family member who interprets/translates for visit.  Patient is status post remote cholecystectomy which was apparently complicated by choledocholithiasis resulting in ERCP with sphincterotomy. Inpatient CT scan revealed cholecystectomy and associated pneumobilia.  CBD was 15 mm without obstruction or stones.  Follow-up MRCP showed 2 mm CBD. She underwent ERCP showed patent sphincterotomy and dilated common bile duct .  Balloon sweep yielded multiple stone fragments.  Following ERCP her total bilirubin normalized, AST decline from 228-80, ALT 284-212.  Her alk phos was always normal.   She is not having any abdominal pain.  She has occasional nausea but her main complaint is that of bloating and belching since procedure.  She has chronic constipation since birth of child.    Past Medical History:  Diagnosis Date  . Cholecystitis, acute with cholelithiasis   . Cyst, ovarian     Patient's surgical history, family medical history, social history, medications and allergies were all reviewed in Epic    Physical Exam: BP 120/80   Pulse 76   Ht '5\' 3"'$  (1.6 m)   Wt 230 lb (104.3 kg)   LMP 10/15/2017 (Exact Date)   BMI 40.74 kg/m   GENERAL: Overweight Spanish female in NAD PSYCH: :Pleasant, cooperative, normal affect EENT:  conjunctiva pink, mucous membranes moist, neck supple without masses CARDIAC:  RRR, no murmur heard, no peripheral edema PULM: Normal respiratory effort, lungs CTA bilaterally, no wheezing ABDOMEN:  Nondistended, soft, nontender. No obvious masses, no hepatomegaly,  normal bowel sounds SKIN:  turgor, no lesions seen Musculoskeletal:  Normal muscle tone, normal strength NEURO: Alert and oriented x 3, no focal neurologic deficits   ASSESSMENT and PLAN:  1.  Pleasant 32 year old Spanish speaking female with a hx of remote cholecystectomy / ERCP with sphincterotomy who was recently hospitalized with choledocholithiasis. She underwent ERCP with removal of stones. Prior sphincterotomy was patent. Here for hospital follow up -repeat liver labs today.    2. Chronic constipation. Last BM 5 days ago.  Taking stool softener and twice daily MiraLAX without improvement.  -Stop MiraLAX it apparently is not working -We discussed fiber, she ask about fiber-containing foods.  She eats wheat bread and oatmeal.  We will add twice daily Citrucel.  If it causes excessive bloating then will need to figure out something else.  -Today she will purge bowels with magnesium citrate then start Citrucel tomorrow -It is okay to take an occasional Dulcolax if needed -Continue to drink 8 glasses of water daily -Patient will let us know how things are going in the next few weeks.   3.  Occasional nausea, recent problems with bloating and belching since ERCP. she is also very constipated right now which could be contributing to the bloating.  -Treat constipation.  Symptoms are relatively new, will give this more time before proceeding with further workup with.  Abdominal exam is benign today  Tye Savoy , NP 10/25/2017, 9:05 AM   Agree with Ms. Guenther's assessment and plan. Gatha Mayer, MD, [facility]

## 2017-10-27 ENCOUNTER — Encounter: Payer: Self-pay | Admitting: Nurse Practitioner

## 2017-11-03 ENCOUNTER — Ambulatory Visit (INDEPENDENT_AMBULATORY_CARE_PROVIDER_SITE_OTHER): Payer: Self-pay | Admitting: Obstetrics and Gynecology

## 2017-11-03 ENCOUNTER — Encounter: Payer: Self-pay | Admitting: Obstetrics and Gynecology

## 2017-11-03 VITALS — BP 118/90 | HR 97 | Ht 63.0 in | Wt 230.8 lb

## 2017-11-03 DIAGNOSIS — N941 Unspecified dyspareunia: Secondary | ICD-10-CM

## 2017-11-03 DIAGNOSIS — N838 Other noninflammatory disorders of ovary, fallopian tube and broad ligament: Secondary | ICD-10-CM

## 2017-11-03 DIAGNOSIS — N83202 Unspecified ovarian cyst, left side: Secondary | ICD-10-CM

## 2017-11-03 NOTE — Progress Notes (Signed)
[facility] Visit  11/03/2017            Patient name: Colleen Arnold MRN [id number]  Date of birth: October 04, 1986  CC & HPI:  Colleen Arnold is a 32 y.o. female presenting today for a pelvic ovarian cystic mass with pain present for about 3 months. She is referred to office by [facility]. Her U/S was on 10/18/2017. She denies any infections recently. She endorses pain in LLQ, dyspareunia for one month. She hasn't had any sexual intercourse for one month due to the pain. She states her abdomen also feels swollen. Patient's last menstrual period was 10/15/2017 (exact date) and lasted until Jan 1st. Typically her periods last 1 week. No alleviating factors noted. She has not tried anything for relief.  She does have ibuprofen 200 mg but has not used it. She recently had gallstones with surgery to remove them. Last pap was 06/2016, and normal. Pt has been trying for 4 years to get pregnant.  Translator present today. Patient taped part of visit discussion to allow her mother in [place] to hear.  ROS:  ROS  (+) left ovarian/tubal mass (-) fever (+) dyspareunia x1 month (+) LLQ pain All systems are negative except as noted in the HPI and PMH.    Pertinent History Reviewed:   Reviewed: Significant for ovarian cyst, C-section Medical         Past Medical History:  Diagnosis Date  . Cholecystitis, acute with cholelithiasis   . Cyst, ovarian   . Elevated LFTs                               Surgical Hx:    Past Surgical History:  Procedure Laterality Date  . CESAREAN SECTION    . CHOLECYSTECTOMY    . ERCP N/A 10/06/2017   Procedure: ENDOSCOPIC RETROGRADE CHOLANGIOPANCREATOGRAPHY (ERCP);  Surgeon: Gatha Mayer, MD;  Location: [facility] ENDOSCOPY;  Service: Endoscopy;  Laterality: N/A;   Medications: Reviewed & Updated - see associated section                       Current Outpatient Medications:  .  HYDROcodone-acetaminophen (NORCO/VICODIN) 5-325 MG tablet, Take 1-2 tablets by mouth every  6 (six) hours as needed for severe pain., Disp: 20 tablet, Rfl: 0 .  omeprazole (PRILOSEC) 20 MG capsule, Take 20 mg by mouth 2 (two) times daily., Disp: , Rfl:  .  docusate sodium (COLACE) 100 MG capsule, Take 1 capsule (100 mg total) by mouth 2 (two) times daily. (Patient not taking: Reported on 11/03/2017), Disp: 60 capsule, Rfl: 0 .  ondansetron (ZOFRAN) 4 MG tablet, Take 1 tablet (4 mg total) by mouth every 8 (eight) hours as needed for nausea or vomiting. (Patient not taking: Reported on 11/03/2017), Disp: 4 tablet, Rfl: 0 .  polyethylene glycol (MIRALAX / GLYCOLAX) packet, Take 17 g by mouth daily. (Patient not taking: Reported on 11/03/2017), Disp: 28 each, Rfl: 0   Social History: Reviewed -  reports that  has never smoked. she has never used smokeless tobacco.  Objective Findings:  Vitals: Blood pressure 118/90, pulse 97, height 5\' 3"  (1.6 m), weight 230 lb 12.8 oz (104.7 kg), last menstrual period 10/15/2017.  Physical Examination: General appearance - alert, well appearing, and in no distress Mental status - alert, oriented to person, place, and time Pelvic -  VULVA: normal appearing vulva with no masses, tenderness or  lesions,  VAGINA: normal appearing vagina with normal color and discharge, no lesions, normal secretions, good length, no infection seen CERVIX: normal appearing cervix without discharge or lesions,  UTERUS: uterus is normal size, shape, consistency and nontender,  ADNEXA:  large cystic structure on left above bladder tender and firm to palpation.   U/S:IMPRESSION: 7.4 cm complex cystic lesion in left adnexa is unchanged in size since recent CT, and has indeterminate but probably benign characteristics. This is suggestive of a cystic ovarian neoplasm. Recommend correlation with tumor markers. Consider surgical evaluation or continued followup by ultrasound in 6-12 weeks.  Normal appearance of uterus and right ovary.  Discussion: Option of laparoscopic surgery  to remove the ovary/tube discussed with patient. Ultrasound results also discussed with patient. She is concerned about difficulty getting pregnant if her tube/ovary is removed. Pt asked to record conversation due to not being able to remember important details.   Assessment & Plan:   A:  1. Left ovarian/tubal mass 2. Discussion on options to manage cyst, including laparoscopic surgery   P:  1. F/u for schedule surgery: laparoscopic left salpingoophorectomy pre-op appointment,  within 7-10 days in office to be sure plans are clear and arranged correctly 2. Order blood work CA125 3. Take ibuprofen 600 mg for pain as needed   The provider spent more than 45 minutes with the visit, including previsit review, documentation and exam, with >50% was spent in counseling and coordination of care., use of translator, and review of information and u/s  By signing my name below, I, [name], attest that this documentation has been prepared under the direction and in the presence of Jonnie Kind, MD. Electronically Signed: Jabier Gauss, Medical Scribe. 11/03/17. 12:23 PM.  I personally performed the services described in this documentation, which was SCRIBED in my presence. The recorded information has been reviewed and considered accurate. It has been edited as necessary during review. Jonnie Kind, MD

## 2017-11-04 LAB — CA 125: CANCER ANTIGEN (CA) 125: 5.3 U/mL (ref 0.0–38.1)

## 2017-11-07 ENCOUNTER — Telehealth: Payer: Self-pay | Admitting: Obstetrics and Gynecology

## 2017-11-07 NOTE — Telephone Encounter (Signed)
Patient called stating that Dr. Glo Herring has told her to take Ibuprofen for her pain, pt has an appointment on Friday and states that the ibuprofen is not working. I let patient know that he is not in the office today. Pt speaks a little bit of english but more spanish Please contact pt

## 2017-11-08 MED ORDER — TRAMADOL HCL 50 MG PO TABS: 50.0000 mg | ORAL_TABLET | Freq: Four times a day (QID) | ORAL | 0 refills | Status: DC | PRN

## 2017-11-08 NOTE — Telephone Encounter (Signed)
Informed patient that Dr Glo Herring wrote prescription for Tramadol and will be faxed to pharmacy. Pt states she has prescription for hydrocodone from previous surgery in September but has not taken them because she didn't know if she could. Informed patient that those were fine to take, to take one tonight and to call tomorrow if pain not relieved and will fax in Tramadol prescription. Pt verbalized understanding.

## 2017-11-10 ENCOUNTER — Encounter: Payer: Self-pay | Admitting: Obstetrics and Gynecology

## 2017-11-10 ENCOUNTER — Ambulatory Visit (INDEPENDENT_AMBULATORY_CARE_PROVIDER_SITE_OTHER): Payer: Self-pay | Admitting: Obstetrics and Gynecology

## 2017-11-10 DIAGNOSIS — Z01818 Encounter for other preprocedural examination: Secondary | ICD-10-CM

## 2017-11-10 DIAGNOSIS — R319 Hematuria, unspecified: Secondary | ICD-10-CM

## 2017-11-10 LAB — POCT URINALYSIS DIPSTICK
GLUCOSE UA: NEGATIVE
Ketones, UA: NEGATIVE
Leukocytes, UA: NEGATIVE
NITRITE UA: NEGATIVE
PROTEIN UA: NEGATIVE

## 2017-11-10 NOTE — Progress Notes (Signed)
Preoperative History and Physical  Colleen Arnold is a 32 y.o. G1P1001 here for surgical management of left adnexal cystic mass.   No significant preoperative concerns. Ca 125 normal at 5.3 vision here with translator Proposed surgery: LAPAROSCOPIC LEFT SALPINGO OOPHORECTOMY, POSSIBLE LAPAROTOMY   Past Medical History:  Diagnosis Date  . Bile duct stone   . Cholecystitis, acute with cholelithiasis   . Cyst, ovarian   . Elevated LFTs    Past Surgical History:  Procedure Laterality Date  . CESAREAN SECTION    . CHOLECYSTECTOMY    . ERCP N/A 10/06/2017   Procedure: ENDOSCOPIC RETROGRADE CHOLANGIOPANCREATOGRAPHY (ERCP);  Surgeon: Gatha Mayer, MD;  Location: [facility] ENDOSCOPY;  Service: Endoscopy;  Laterality: N/A;   OB History  Gravida Para Term Preterm AB Living  1 1 1     1   SAB TAB Ectopic Multiple Live Births          1    # Outcome Date GA Lbr Len/2nd Weight Sex Delivery Anes PTL Lv  1 Term     F CS-Unspec   LIV    Patient denies any other pertinent gynecologic issues.   Current Outpatient Medications on File Prior to Visit  Medication Sig Dispense Refill  . HYDROcodone-acetaminophen (NORCO/VICODIN) 5-325 MG tablet Take 1-2 tablets by mouth every 6 (six) hours as needed for severe pain. 20 tablet 0  . OMEPRAZOLE PO Take by mouth 2 (two) times daily.     No current facility-administered medications on file prior to visit.    Allergies  Allergen Reactions  . Rocephin [Ceftriaxone Sodium In Dextrose] Rash    Social History:   reports that  has never smoked. she has never used smokeless tobacco. She reports that she does not drink alcohol or use drugs.  Family History  Problem Relation Age of Onset  . Hyperlipidemia Mother   . Diabetes Father   . Hypertension Brother   . Colon cancer Neg Hx     Review of Systems: Noncontributory  PHYSICAL EXAM: Blood pressure 112/80, pulse 65, height 5\' 3"  (1.6 m), weight 231 lb 6.4 oz (105 kg), last menstrual period  10/15/2017. General appearance - alert, well appearing, and in no distress Chest - clear to auscultation, no wheezes, rales or rhonchi, symmetric air entry Heart - normal rate and regular rhythm Abdomen - soft, nontender, nondistended, no masses or organomegaly                     Tender to palpation in llq Pelvic - examination Efg normal, vagina normal, cervix midline, nonpurulent bimanual exam anterior uterus, fullness in left lower quadrant, left adnexa Extremities - peripheral pulses normal, no pedal edema, no clubbing or cyanosis  Labs: Results for orders placed or performed in visit on 11/03/17 (from the past 336 hour(s))  CA 125   Collection Time: 11/03/17  1:38 PM  Result Value Ref Range   Cancer Antigen (CA) 125 5.3 0.0 - 38.1 U/mL    Imaging Studies: US Pelvis Transvanginal Non-ob (tv Only)  Result Date: 10/18/2017 CLINICAL DATA:  Abdominal pain. Followup left adnexal cystic lesion on recent CT. EXAM: TRANSABDOMINAL AND TRANSVAGINAL ULTRASOUND OF PELVIS TECHNIQUE: Both transabdominal and transvaginal ultrasound examinations of the pelvis were performed. Transabdominal technique was performed for global imaging of the pelvis including uterus, ovaries, adnexal regions, and pelvic cul-de-sac. It was necessary to proceed with endovaginal exam following the transabdominal exam to visualize the cystic lesion in left adnexa. COMPARISON:  CT on 10/05/2017 FINDINGS:  Uterus Measurements: 8.6 x 3.6 x 6.1 cm. No fibroids or other mass visualized. Endometrium Thickness: 4 mm.  No focal abnormality visualized. Right ovary Measurements: 3.8 x 2.1 x 2.9 cm. Normal appearance/no adnexal mass. Left ovary Measurements: No normal ovary visualized. A complex cystic lesion containing several thin internal septations is seen in the left adnexa. No thick septations or solid mural nodules identified. This lesion measures 7.4 by 5.0 x 6.5 cm, and has not significantly changed since previous study. Other findings  No abnormal free fluid. IMPRESSION: 7.4 cm complex cystic lesion in left adnexa is unchanged in size since recent CT, and has indeterminate but probably benign characteristics. This is suggestive of a cystic ovarian neoplasm. Recommend correlation with tumor markers. Consider surgical evaluation or continued followup by ultrasound in 6-12 weeks. Normal appearance of uterus and right ovary. Electronically Signed   By: Earle Gell M.D.   On: 10/18/2017 09:43   US Pelvis (transabdominal Only)  Result Date: 10/18/2017 CLINICAL DATA:  Abdominal pain. Followup left adnexal cystic lesion on recent CT. EXAM: TRANSABDOMINAL AND TRANSVAGINAL ULTRASOUND OF PELVIS TECHNIQUE: Both transabdominal and transvaginal ultrasound examinations of the pelvis were performed. Transabdominal technique was performed for global imaging of the pelvis including uterus, ovaries, adnexal regions, and pelvic cul-de-sac. It was necessary to proceed with endovaginal exam following the transabdominal exam to visualize the cystic lesion in left adnexa. COMPARISON:  CT on 10/05/2017 FINDINGS: Uterus Measurements: 8.6 x 3.6 x 6.1 cm. No fibroids or other mass visualized. Endometrium Thickness: 4 mm.  No focal abnormality visualized. Right ovary Measurements: 3.8 x 2.1 x 2.9 cm. Normal appearance/no adnexal mass. Left ovary Measurements: No normal ovary visualized. A complex cystic lesion containing several thin internal septations is seen in the left adnexa. No thick septations or solid mural nodules identified. This lesion measures 7.4 by 5.0 x 6.5 cm, and has not significantly changed since previous study. Other findings No abnormal free fluid. IMPRESSION: 7.4 cm complex cystic lesion in left adnexa is unchanged in size since recent CT, and has indeterminate but probably benign characteristics. This is suggestive of a cystic ovarian neoplasm. Recommend correlation with tumor markers. Consider surgical evaluation or continued followup by ultrasound  in 6-12 weeks. Normal appearance of uterus and right ovary. Electronically Signed   By: Earle Gell M.D.   On: 10/18/2017 09:43    Assessment: Patient Active Problem List   Diagnosis Date Noted  . Epigastric pain   . Intractable abdominal pain 10/06/2017  . LFT elevation 10/06/2017  . Ovarian mass, left 10/06/2017  . Biliary obstruction 10/06/2017  . Elevated LFTs   . Choledocholithiasis with obstruction     Plan: Patient will undergo surgical management with LAPAROSCOPIC LEFT SALPINGO OOPHORECTOMY, POSSIBLE LAPAROTOMY.  Patient counseled over process.  The patient had many questions regarding future fertility.  I explained to her that we may be able to salvage the left ovary if it is the tube that is the problem or the ovary may be removed and the tube salvage.  Either way patient will still have the right tube and ovary.  We will take photos to document the condition of the right tube and ovary which would determine her future fertility for the most part.  Questions encouraged and answered.  We explained to the patient that we could not give absolute guarantees as to whether the mass was benign or whether it involved the tube, the ovary or both, and the tissue testing will be performed on the removed tissue .  mec 11/10/2017 9:13 AM

## 2017-11-11 LAB — MICROSCOPIC EXAMINATION: CASTS: NONE SEEN /LPF

## 2017-11-11 LAB — URINALYSIS, ROUTINE W REFLEX MICROSCOPIC
Bilirubin, UA: NEGATIVE
Glucose, UA: NEGATIVE
Ketones, UA: NEGATIVE
LEUKOCYTES UA: NEGATIVE
NITRITE UA: NEGATIVE
PH UA: 6.5 (ref 5.0–7.5)
Protein, UA: NEGATIVE
Specific Gravity, UA: 1.019 (ref 1.005–1.030)
Urobilinogen, Ur: 0.2 mg/dL (ref 0.2–1.0)

## 2017-11-12 LAB — URINE CULTURE

## 2017-11-12 LAB — GC/CHLAMYDIA PROBE AMP
Chlamydia trachomatis, NAA: NEGATIVE
NEISSERIA GONORRHOEAE BY PCR: NEGATIVE

## 2017-11-15 ENCOUNTER — Telehealth: Payer: Self-pay | Admitting: *Deleted

## 2017-11-15 NOTE — Patient Instructions (Addendum)
Instrucciones Para Antes de la Ciruga   Su ciruga est programada para-(your procedure is scheduled on) 11/28/2017    Colleen Arnold      Por favor llame al [phone] si tiene algn problema en la maana de la ciruga. (please call if you have any problems the morning of surgery.)                  Recuerde: (Remember)   No coma alimentos ni tome lquidos, incluyendo agua, despus de la medianoche del  (Do not eat food or drink liquids including water after midnight on 11/27/2017.   Tome estas medicinas en la maana de la ciruga con un SORBITO de agua (take these meds the morning of surgery with a SIP of water)  Hydrocodone and prilosec.   Puede cepillarse los dientes en la maana de la [place]. (you may brush your teeth the morning of surgery)   No use joyas, maquillaje de ojos, lpiz labial, crema para el cuerpo o esmalte de uas oscuro. (Do not wear jewelry, eye makeup, lipstick, body lotion, or dark fingernail polish)   No puede usar desodorante. (you may wear deodorant)   Si va a ser ingresado despues de la ciruga, deje la maleta en el carro hasta que se le haya asignado una habitacin. (If you are to be admitted after surgery, leave suitcase in car until your room has been assigned.)   A los pacientes que se les d de alta el mismo da no se les permitir manejar a casa.  (Patients discharged on the day of surgery will not be allowed to drive home)   Use ropa suelta y cmoda de regreso a casa. (wear loose comfortable clothes for ride home)    Salpingectoma (Salpingectomy) La salpingectoma, tambin llamada tubectoma es la extirpacin [place] de una de las trompas de falopio. Las trompas de falopio son tubos que estn conectados al tero. Estos conductos transportan los vulos desde los ovarios hasta el tero. Una salpingectoma puede indicarse por diferentes motivos, entre ellos:    Embarazo ectpico. Esto se produce especialmente si la trompa se rompe.  Trompa de falopio infectada.  Necesidad de extirpar la trompa de falopio al eliminar un ovario con un quiste o tumor.  Necesidad de extirpar la trompa de falopio al eliminar el tero.  Cncer de la trompa de falopio u rganos cercanos. La extirpacin de una trompa de falopio no le imposibilita quedar embarazada. No causa problemas en los ciclos menstruales.  INFORME A SU MDICO:  Cualquier alergia que tenga.  Todos los [employer] [place], incluyendo vitaminas, hierbas, gotas oftlmicas, cremas y medicamentos de venta libre.  Problemas previos que usted o los [employer] de su familia hayan tenido con el uso de anestsicos.  Enfermedades de [employer].  Cirugas previas.  Padecimientos mdicos. RIESGOS Y COMPLICACIONES  Generalmente es un procedimiento seguro. Sin embargo, Games developer procedimiento, pueden surgir complicaciones. Las complicaciones posibles son:  [employer] rganos circundantes.  Hemorragias.  Infeccin.  Problemas relacionados con la anestesia. ANTES DEL PROCEDIMIENTO  Consulte a su mdico si debe cambiar o suspender los medicamentos que toma habitualmente. Es posible  que deba dejar de tomar ciertos medicamentos antes de la [place].  No debe comer ni beber nada durante al menos 8 horas antes de la [place].  Si fuma, no lo haga al [employer] previas a la [place].  Haga arreglos para que alguien lo lleve a su casa despus del procedimiento o de la hospitalizacin. Tambin pdale a alguna persona que lo ayude con sus actividades mientras se recupera. PROCEDIMIENTO   Antes del procedimiento le darn un medicamento para que pueda relajarse (sedante). Le administrarn medicamentos para hacerlo dormir durante el procedimiento (anestesia general). Este medicamento se aplica a travs de una va intravenosa (IV) que se coloca en una vena.  Cuando est dormida, le  higienizarn y rasurarn el abdomen. Le insertarn un tubo flexible (catter)en la vejiga.  El [place] podr usar una tcnica laparoscpica o Ardelia Mems ciruga abierta. ? En la tcnica laparoscpica, la ciruga se realiza a travs de dos pequeos cortesincisiones) en el abdomen. Se inserta un tubo delgado que emite luz y que posee una cmara (laparoscopio) en una de las incisiones. A travs de estos tubos se coloca el instrumental necesario para los procedimientos. ? Podr optarse por una tcnica robtica para realizar una ciruga compleja en un espacio pequeo. En la tcnica robtica se realizan pequeas incisiones. A travs de las incisiones se insertar una cmara e instrumentos quirrgicos. Los instrumentos quirrgicos se controlarn con la ayuda de un brazo robtico. ? En la tcnica abierta, la [place] se realiza a travs de una gran incisin en el abdomen.  Al utilizar cualquiera de estas tcnicas, el cirujano extirpa la trompa de Fal[place] desde donde est unida al tero. Los vasos sanguneos se pinzarn y Armed forces operational officer.  El cirujano cerrar la incisin con grapas o puntos de sutura. DESPUS DEL PROCEDIMIENTO   La llevarn al rea de recuperacin donde controlarn su evolucin durante 1 a 3 horas.  Si utilizaron la tcnica laparoscpica, Chief Financial Officer a su casa despus de algunas horas. Despus del procedimiento por va laparoscpica podr sentir dolor en los hombros. Esto es normal y generalmente desaparece en un [place].  Si se [place] la tcnica Scientist, research (life sciences) hospital durante un par [place].  Le darn medicamentos para el dolor si los necesita.  Le quitarn la va IV y el catter antes de darle el alta. Esta informacin no tiene Marine scientist el consejo del mdico. Asegrese de hacerle al mdico cualquier pregunta que tenga. Document Released: 07/31/2009 Document Revised: 07/24/2013 Elsevier Interactive Patient Education  2017 [place],  cuidados posteriores (Salpingectomy, Care After) [place] esta informacin sobre cmo cuidarse despus del procedimiento. El mdico tambin podr darle instrucciones ms especficas. Comunquese con el mdico si tiene problemas o preguntas. QU ESPERAR DESPUS DEL PROCEDIMIENTO Despus del procedimiento, es comn tener los siguientes sntomas:  Dolor en el abdomen.  Algn sangrado vaginal ocasional (pequeas manchas).  Cansancio. INSTRUCCIONES PARA EL CUIDADO EN EL HOGAR Cuidados de la incisin  Mantenga la zona de la incisin y el vendaje limpios y secos .  Siga las indicaciones del mdico acerca del cuidado de la incisin. Haga lo siguiente: ? Lvese las manos con agua y [place] antes de cambiar el vendaje. Use desinfectante para manos si no dispone de [place] y [place]. ? Cambie el vendaje como se lo haya indicado el mdico. ? No retire los puntos (suturas), las grapas, el pegamento para la piel o las tiras [place]. Es posible que estos deban quedar puestos  en la piel durante 2semanas o ms tiempo. Si los bordes de las tiras adhesivas empiezan a despegarse y Therapist, sports, puede recortar los que estn sueltos. No retire las tiras [facility] por completo a menos que el mdico se lo indique.  [place] zona de la incisin para detectar signos de infeccin. Est atenta a los siguientes signos: ? Aumento del enrojecimiento, de la hinchazn o del dolor. ? Ms lquido Delorise Shiner. ? Calor. ? Pus o mal olor. Actividad  No conduzca ni use maquinaria pesada mientras toma analgsicos recetados.  No conduzca durante 24horas si le dieron un medicamento para ayudarla a que se relaje (sedante).  Haga reposo como se lo haya indicado el mdico. Pregntele al mdico qu actividades son seguras para usted. Debe evitar lo siguiente: ? Levantar objetos que pesen ms de 10libras (4,5kg) hasta que el mdico la autorice. ? Actividades que requieran [employer].  Hasta que el mdico la  autorice: ? No se haga duchas vaginales. ? No use tampones. ? No tenga relaciones sexuales. Instrucciones generales  [employer] de venta libre y los recetados solamente como se lo haya indicado el mdico.  A fin de prevenir o tratar el estreimiento mientras toma analgsicos recetados, el mdico puede recomendarle lo siguiente: ? Electronics engineer suficiente lquido para Theatre manager la orina clara o de color amarillo plido. ? Tomar medicamentos recetados o de [employer]. ? Consumir alimentos ricos en fibra, como frutas y verduras frescas, cereales integrales y frijoles. ? Limitar el consumo de alimentos con alto contenido de grasas y azcares procesados, como alimentos fritos o dulces.  No tome baos de inmersin, no nade ni use el jacuzzi hasta que el mdico lo autorice. Puede ducharse.  Use las medias de compresin como se lo haya indicado el mdico. Estas medias ayudan a Mining engineer formacin de cogulos sanguneos y a Clinical cytogeneticist hinchazn de las piernas.  Concurra a todas las visitas de control como se lo haya indicado el mdico. Esto es importante. SOLICITE ATENCIN MDICA SI:  Tiene los siguientes sntomas: ? Dolor al Garment/textile technologist. ? Aumento del enrojecimiento, de la hinchazn o del dolor alrededor de la incisin. ? Aumento del lquido o de la sangre que sale de la incisin. ? Pus o mal olor en TEFL teacher de la incisin. ? Fiebre. ? Dolor que empeora o que no mejora con los medicamentos.  La incisin est caliente al tacto.  La incisin comienza a abrirse.  Le aparece una erupcin cutnea.  Presenta nuseas o vmitos.  Tiene sensacin de desvanecimiento. SOLICITE ATENCIN MDICA DE INMEDIATO SI:  Siente dolor en el pecho o en la pierna.  Le falta el aire.  Se desmaya.  Aument el sangrado vaginal. Esta informacin no tiene Marine scientist el consejo del mdico. Asegrese de hacerle al mdico cualquier pregunta que tenga. Document Released: 01/07/2011 Document Revised:  07/24/2013 Document Reviewed: 04/18/2016 Elsevier Interactive Patient Education  2018 [place] general en los adultos (General Anesthesia, Adult) La anestesia general es el uso de medicamentos para dormir a Furniture conservator/restorer persona (dejarla inconsciente) para una intervencin [place]. La anestesia general suele recomendarse cuando una ciruga:  Va a durar [employer].  Requiere que se quede quieto o que est en una posicin inusual.  Es mayor y puede provocar que pierda [place].  Es imposible de Optometrist sin anestesia general. Los medicamentos utilizados para la anestesia general se llaman anestsicos generales. Adems de mantenerlo dormido, estos medicamentos:  Teacher, music.  Controlan la  presin arterial.  Relajan los msculos. INFORME A SU MDICO:  Cualquier alergia que tenga.  Todos los [employer], incluidos vitaminas, hierbas, gotas oftlmicas, cremas y medicamentos de venta libre.  Cualquier problema que usted o sus familiares hayan tenido con la anestesia.  Tipos de anestsicos que le hayan dado en el pasado.  Cualquier trastorno hemorrgico que tenga.  Cirugas previas.  Cualquier enfermedad que tenga.  Cualquier antecedente de afecciones cardacas o pulmonares, como insuficiencia cardaca, apnea del sueo o enfermedad pulmonar obstructiva crnica (EPOC).  Si est embarazada o podra estarlo.  Si consume tabaco, alcohol, marihuana o drogas.  Antecedentes de [employer].  Antecedentes de depresin o ansiedad. RIESGOS Y COMPLICACIONES En general, se trata de un procedimiento seguro. Sin embargo, pueden presentarse problemas, por ejemplo:  Reaccin alrgica a la anestesia.  Problemas cardacos o pulmonares.  Inhalar alimentos o lquidos del estmago a los pulmones (aspiracin).  Lesin en los nervios.  Despertarse durante la [place] y no poder moverse (raro).  Nerviosismo extremo o estado de confusin mental (delirio) al  despertarse de la anestesia.  Aire en el torrente sanguneo, que puede provocar un ictus. Es ms probable que estos problemas surjan en caso de que le realicen una ciruga mayor o si tiene una enfermedad terminal. Puede evitar algunas de estas complicaciones respondiendo a todas las preguntas del mdico concienzudamente y siguiendo todas las indicaciones previas a la [place]. La anestesia general puede causar efectos secundarios, como por ejemplo:  Nuseas o vmitos  Dolor de garganta causado por el tubo endotraqueal.  Fro o escalofros.  Sentirse cansado, sin fuerzas o dolorido.  Sueo o somnolencia.  Confusin o nerviosismo. ANTES DEL PROCEDIMIENTO Mantenerse hidratado Siga las indicaciones del mdico acerca de la hidratacin, las cuales pueden incluir lo siguiente:  Hasta 2horas antes del procedimiento, puede beber lquidos transparentes, como agua, jugos frutales transparentes, caf negro y t solo. Restricciones en las comidas y bebidas [place] comidas y las bebidas, las cuales pueden incluir lo siguiente:  Ocho horas antes del procedimiento, deje de ingerir comidas o alimentos pesados, por ejemplo, carne, alimentos fritos o alimentos grasos.  Seis horas antes del procedimiento, deje de ingerir comidas o alimentos livianos, como tostadas o cereales.  Seis horas antes del procedimiento, deje de beber [place] o bebidas que [employer].  Dos horas antes del procedimiento, deje de beber lquidos transparentes. Medicamentos  Consulte al mdico si debe hacer o no lo siguiente: ? Cambiar o suspender los medicamentos que toma habitualmente. Esto es muy importante si toma medicamentos para la diabetes o anticoagulantes. ? Tomar medicamentos como aspirina e ibuprofeno. Estos medicamentos pueden tener un efecto anticoagulante en la [place]. No tome estos medicamentos antes del procedimiento si el mdico le indica que no lo haga. ? Tomar  complementos alimenticios o medicamentos nuevos. No tome estos medicamentos durante la semana anterior a la ciruga, a menos que el mdico lo autorice.  Si le indican que debe tomar un medicamento o que debe continuar tomando un medicamento el da de la [place], tmelo con sorbos de [place]. Instrucciones generales  Pregunte si volver a su [place], o si debe quedarse en el hospital por [employer]. ? Pdale a alguien que lo lleve a su casa. ? Pdale a alguien que se quede con usted durante las primeras 24horas despus de salir del hospital o de la clnica.  No consuma ningn producto que contenga tabaco, como cigarrillos, tabaco de  mascar y Psychologist, sport and exercise, durante al menos 3 a 6semanas antes de la [place].  Puede lavarse los dientes la maana de la [place], pero asegrese de escupir la pasta de dientes. PROCEDIMIENTO  Le aplicarn la anestesia con Judene Companion y por va intravenosa (IV).  Es posible que le den un medicamento para ayudarlo a Nurse, children's (sedante).  Una vez que est dormido, es posible que le coloquen un tubo para ayudarlo a Ambulance person.  Un anestesista permanecer con usted durante toda la [place]. Continuar dndole los medicamentos que necesita y Secretary/administrator la dosis para mantenerlo cmodo y Engineer, maintenance. Tambin le controlarn la presin arterial, el pulso y los niveles de oxgeno para asegurarse de que no tenga ningn problema.  Si le colocaron un tubo para ayudarlo a Ambulance person, se lo quitarn antes de que se despierte. Este procedimiento puede variar segn el mdico y el hospital. DESPUS DEL PROCEDIMIENTO  Cuando termine la ciruga, por lo general, se despertar lentamente y en una sala de reanimacin.  Le controlarn la presin arterial, la frecuencia cardaca, la frecuencia respiratoria y Retail buyer de oxgeno en la sangre hasta que haya desaparecido el efecto de los medicamentos administrados.  Es posible que le den [employer] lo ayuden  a calmarse si se siente nervioso o ansioso.  Si se ir a su casa el mismo da, el mdico verificar si puede pararse, beber y Garment/textile technologist.  El mdico tambin tratar Conservation officer, historic buildings y los efectos secundarios antes de que se vaya a su casa.  No conduzca durante 24horas si le administraron un sedante.  Es posible que tenga o sienta lo siguiente: ? Nuseas o vmitos. ? Dolor de Investment banker, operational. ? Lentitud mental. ? Fro o escalofros. ? Somnolencia. ? Cansancio. ? Dolor o inflamacin, incluso en partes del cuerpo no afectadas por la ciruga. Esta informacin no tiene Marine scientist el consejo del mdico. Asegrese de hacerle al mdico cualquier pregunta que tenga. Document Released: 02/02/2011 Document Revised: 10/24/2014 Document Reviewed: 09/17/2015 Elsevier Interactive Patient Education  2018 [place] general en los adultos, cuidados posteriores (General Anesthesia, Adult, Care After) Estas indicaciones le proporcionan informacin acerca de cmo deber cuidarse despus del procedimiento. El mdico tambin podr darle instrucciones ms especficas. El tratamiento ha sido planificado segn las prcticas mdicas actuales, pero en algunos casos pueden ocurrir problemas. Comunquese con el mdico si tiene algn problema o dudas despus del procedimiento. QU ESPERAR DESPUS DEL PROCEDIMIENTO Despus del procedimiento, es comn [employer] siguientes sntomas:  Vmitos.  Dolor de Investment banker, operational.  Lentitud mental. Es normal sentir lo siguiente:  Nuseas.  Fro o escalofros.  Somnolencia.  Cansancio.  Dolor o inflamacin, incluso en partes del cuerpo no afectadas por la ciruga. INSTRUCCIONES PARA EL CUIDADO EN EL HOGAR Durante al menos 24horas despus del procedimiento:  No haga lo siguiente: ? Participar en actividades que impliquen posibles cadas o lesiones. ? Conducir vehculos. ? Operar maquinarias pesadas. ? Beber alcohol. ? Tomar somnferos o medicamentos que causen  somnolencia. ? Firmar documentos legales ni tomar [employer]. ? Cuidar a nios por su cuenta.  Hacer reposo. Comida y bebida  Si vomita, tome agua, jugo o sopa una vez que pueda beber sin vomitar.  Beba suficiente lquido para Consulting civil engineer orina clara o de color amarillo plido.  Asegrese de no tener nuseas antes de ingerir alimentos slidos.  Siga la dieta recomendada por el mdico. Instrucciones generales  Permanezca con un adulto responsable hasta que est completamente despierto y consciente.  Retome sus [employer] se  lo haya indicado el mdico. Pregntele al mdico qu actividades son seguras para usted.  Tome los medicamentos de venta libre y los recetados solamente como se lo haya indicado el mdico.  Si fuma, no lo haga sin supervisin.  Concurra a todas las visitas de control como se lo haya indicado el mdico. Esto es importante. SOLICITE ATENCIN MDICA SI:  Contina con nuseas o vmitos en su casa, y los medicamentos no ayudan.  No puede beber lquidos ni volver a comer.  No puede orinar despus de 8 a 12horas.  Tiene una erupcin cutnea.  Tiene fiebre.  Tiene cada vez ms enrojecimiento en la zona de la ciruga. SOLICITE ATENCIN MDICA DE INMEDIATO SI:  Tiene dificultad para respirar.  Siente dolor en el pecho.  Tiene una hemorragia imprevista.  Siente que tiene un problema potencialmente mortal o urgente. Esta informacin no tiene Marine scientist el consejo del mdico. Asegrese de hacerle al mdico cualquier pregunta que tenga. Document Released: 10/03/2005 Document Revised: 10/24/2014 Document Reviewed: 09/17/2015 Elsevier Interactive Patient Education  2018 [place]  11/15/2017     @[email or web address] @   Your procedure is scheduled on  11/28/2017 .  Report to [facility] at  615   A.M.  Call this number if you have problems the morning of surgery:  [phone]   Remember:  Do not  eat food or drink liquids after midnight.  Take these medicines the morning of surgery with A SIP OF WATER  Hydrocodone, prilosec.   Do not wear jewelry, make-up or nail polish.  Do not wear lotions, powders, or perfumes, or deodorant.  Do not shave 48 hours prior to surgery.  Men may shave face and neck.  Do not bring valuables to the hospital.  [facility] is not responsible for any belongings or valuables.  Contacts, dentures or bridgework may not be worn into surgery.  Leave your suitcase in the car.  After surgery it may be brought to your room.  For patients admitted to the hospital, discharge time will be determined by your treatment team.  Patients discharged the day of surgery will not be allowed to drive home.   Name and phone number of your driver:   family Special instructions:  None  Please read over the following fact sheets that you were given. Anesthesia Post-op Instructions and Care and Recovery After Surgery       Salpingectomy Salpingectomy, also called tubectomy, is the surgical removal of one of the fallopian tubes. The fallopian tubes are where eggs travel from the ovaries to the uterus. Removing one fallopian tube does not prevent you from becoming pregnant. It also does not cause problems with your menstrual periods. You may need a salpingectomy if you:  Have a fertilized egg that attaches to the fallopian tube (ectopic pregnancy), especially one that causes the tube to burst or tear (rupture).  Have an infected fallopian tube.  Have cancer of the fallopian tube or nearby organs.  Have had an ovary removed due to a cyst or tumor.  Have had your uterus removed.  There are three different methods that can be used for a salpingectomy:  Open. This method involves making one large incision in your abdomen.  Laparoscopic. This method involves using a thin, lighted tube with a tiny camera on the end (laparoscope) to help perform the procedure. The laparoscope  will allow your surgeon to make several small incisions in the abdomen instead of a large incision.  Robot-assisted:  This method involves using a computer to control surgical instruments that are attached to robotic arms.  Tell a health care provider about:  Any allergies you have.  All medicines you are taking, including vitamins, herbs, eye drops, creams, and over-the-counter medicines.  Any problems you or family members have had with anesthetic medicines.  Any blood disorders you have.  Any surgeries you have had.  Any medical conditions you have.  Whether you are pregnant or may be pregnant. What are the risks? Generally, this is a safe procedure. However, problems may occur, including:  Infection.  Bleeding.  Allergic reactions to medicines.  Damage to other structures or organs.  Blood clots in the legs or lungs.  What happens before the procedure? Staying hydrated Follow instructions from your health care provider about hydration, which may include:  Up to 2 hours before the procedure - you may continue to drink clear liquids, such as water, clear fruit juice, black coffee, and plain tea.  Eating and drinking restrictions Follow instructions from your health care provider about eating and drinking, which may include:  8 hours before the procedure - stop eating heavy meals or foods such as meat, fried foods, or fatty foods.  6 hours before the procedure - stop eating light meals or foods, such as toast or cereal.  6 hours before the procedure - stop drinking milk or drinks that contain milk.  2 hours before the procedure - stop drinking clear liquids.  Medicines  Ask your health care provider about: ? Changing or stopping your regular medicines. This is especially important if you are taking diabetes medicines or blood thinners. ? Taking medicines such as aspirin and ibuprofen. These medicines can thin your blood. Do not take these medicines before your  procedure if your health care provider instructs you not to.  You may be given antibiotic medicine to help prevent infection. General instructions  Do not smoke for at least 2 weeks before your procedure. If you need help quitting, ask your health care provider.  You may have an exam or tests, such as an electrocardiogram (ECG).  You may have a blood or urine sample taken.  Ask your health care provider: ? Whether you should stop removing hair from your surgical area. ? How your surgical site will be marked or identified.  You may be asked to shower with a germ-killing soap.  Plan to have someone take you home from the hospital or clinic.  If you will be going home right after the procedure, plan to have someone with you for 24 hours. What happens during the procedure?  To reduce your risk of infection: ? Your health care team will wash or sanitize their hands. ? Hair may be removed from the surgical area. ? Your skin will be washed with soap.  An IV tube will be inserted into one of your veins.  You will be given a medicine to make you fall asleep (general anesthetic). You may also be given a medicine to help you relax (sedative).  A thin tube (catheter) may be inserted through your urethra and into your bladder to drain urine during your procedure.  Depending on the type of procedure you are having, one incision or several small incisions will be made in your abdomen.  Your fallopian tube will be cut and removed from where it attaches to your uterus.  Your blood vessels will be clamped and tied to prevent excess bleeding.  The incision(s) in your abdomen will be  closed with stitches (sutures), staples, or skin glue.  A bandage (dressing) may be placed over your incision(s). The procedure may vary among health care providers and hospitals. What happens after the procedure?  Your blood pressure, heart rate, breathing rate, and blood oxygen level will be monitored until  the medicines you were given have worn off.  You may continue to receive fluids and medicines through an IV tube.  You may continue to have a catheter draining your urine.  You may have to wear compression stockings. These stockings help to prevent blood clots and reduce swelling in your legs.  You will be given pain medicine as needed.  Do not drive for 24 hours if you received a sedative. Summary  Salpingectomy is a surgical procedure to remove one of the fallopian tubes.  The procedure may be done with an open incision, with a laparoscope, or with computer-controlled instruments.  Depending on the type of procedure you are having, one incision or several small incisions will be made in your abdomen.  Your blood pressure, heart rate, breathing rate, and blood oxygen level will be monitored until the medicines you were given have worn off.  Plan to have someone take you home from the hospital or clinic. This information is not intended to replace advice given to you by your health care provider. Make sure you discuss any questions you have with your health care provider. Document Released: 02/19/2009 Document Revised: 05/20/2016 Document Reviewed: 03/27/2013 Elsevier Interactive Patient Education  2018 [place] After This sheet gives you information about how to care for yourself after your procedure. Your health care provider may also give you more specific instructions. If you have problems or questions, contact your health care provider. What can I expect after the procedure? After your procedure, it is common to have:  Pain in your abdomen.  Some occasional vaginal bleeding (spotting).  Tiredness.  Follow these instructions at home: Incision care   Keep your incision area and your bandage (dressing) clean and dry.  Follow instructions from your health care provider about how to take care of your incision. Make sure you: ? Wash your hands  with soap and water before you change your dressing. If soap and water are not available, use hand sanitizer. ? Change your dressing astold by your health care provider. ? Leave stitches (sutures), staples, skin glue, or adhesive strips in place. These skin closures may need to stay in place for 2 weeks or longer. If adhesive strip edges start to loosen and curl up, you may trim the loose edges. Do not remove adhesive strips completely unless your health care provider tells you to do that.  Check your incision area every day for signs of infection. Check for: ? More redness, swelling, or pain. ? More fluid or blood. ? Warmth. ? Pus or a bad smell. Activity   Do not drive or use heavy machinery while taking prescription pain medicine.  Do not drive for 24 hours if you received a medicine to help you relax (sedative).  Rest as directed by your health care provider. Ask your health care provider what activities are safe for you. You should avoid: ? Lifting anything that is heavier than 10 lb (4.5 kg) until your health care provider approves. ? Activities that require a lot of energy.  Until your health care provider approves: ? Do not douche. ? Do not use tampons. ? Do not have sexual intercourse. General instructions  Take over-the-counter and  prescription medicines only as told by your health care provider.  To prevent or treat constipation while you are taking prescription pain medicine, your health care provider may recommend that you: ? Drink enough fluid to keep your urine clear or pale yellow. ? Take over-the-counter or prescription medicines. ? Eat foods that are high in fiber, such as fresh fruits and vegetables, whole grains, and beans. ? Limit foods that are high in fat and processed sugars, such as fried and sweet foods.  Do not take baths, swim, or use a hot tub until your health care provider approves. You may take showers.  Wear compression stockings as told by your  health care provider. These stockings help to prevent blood clots and reduce swelling in your legs.  Keep all follow-up visits as told by your health care provider. This is important. Contact a health care provider if:  You have: ? Pain when you urinate. ? More redness, swelling, or pain around your incision. ? More fluid or blood coming from your incision. ? Pus or a bad smell coming from your incision. ? A fever. ? Abdominal pain that gets worse or does not get better with medicine.  Your incision feels warm to the touch.  Your incision starts to break open.  You develop a rash.  You develop nausea and vomiting.  You feel light-headed. Get help right away if:  You develop pain in your chest or leg.  You develop shortness of breath.  You faint.  You have increased vaginal bleeding. This information is not intended to replace advice given to you by your health care provider. Make sure you discuss any questions you have with your health care provider. Document Released: 01/07/2011 Document Revised: 06/01/2016 Document Reviewed: 06/02/2016 Elsevier Interactive Patient Education  2018 [place] Anesthesia, Adult General anesthesia is the use of medicines to make a person "go to sleep" (be unconscious) for a medical procedure. General anesthesia is often recommended when a procedure:  Is long.  Requires you to be still or in an unusual position.  Is major and can cause you to lose blood.  Is impossible to do without general anesthesia.  The medicines used for general anesthesia are called general anesthetics. In addition to making you sleep, the medicines:  Prevent pain.  Control your blood pressure.  Relax your muscles.  Tell a health care provider about:  Any allergies you have.  All medicines you are taking, including vitamins, herbs, eye drops, creams, and over-the-counter medicines.  Any problems you or family members have had with anesthetic  medicines.  Types of anesthetics you have had in the past.  Any bleeding disorders you have.  Any surgeries you have had.  Any medical conditions you have.  Any history of heart or lung conditions, such as heart failure, sleep apnea, or chronic obstructive pulmonary disease (COPD).  Whether you are pregnant or may be pregnant.  Whether you use tobacco, alcohol, marijuana, or street drugs.  Any history of Armed forces logistics/support/administrative officer.  Any history of depression or anxiety. What are the risks? Generally, this is a safe procedure. However, problems may occur, including:  Allergic reaction to anesthetics.  Lung and heart problems.  Inhaling food or liquids from your stomach into your lungs (aspiration).  Injury to nerves.  Waking up during your procedure and being unable to move (rare).  Extreme agitation or a state of mental confusion (delirium) when you wake up from the anesthetic.  Air in the bloodstream, which can  lead to stroke.  These problems are more likely to develop if you are having a major surgery or if you have an advanced medical condition. You can prevent some of these complications by answering all of your health care provider's questions thoroughly and by following all pre-procedure instructions. General anesthesia can cause side effects, including:  Nausea or vomiting  A sore throat from the breathing tube.  Feeling cold or shivery.  Feeling tired, washed out, or achy.  Sleepiness or drowsiness.  Confusion or agitation.  What happens before the procedure? Staying hydrated Follow instructions from your health care provider about hydration, which may include:  Up to 2 hours before the procedure - you may continue to drink clear liquids, such as water, clear fruit juice, black coffee, and plain tea.  Eating and drinking restrictions Follow instructions from your health care provider about eating and drinking, which may include:  8 hours before the procedure  - stop eating heavy meals or foods such as meat, fried foods, or fatty foods.  6 hours before the procedure - stop eating light meals or foods, such as toast or cereal.  6 hours before the procedure - stop drinking milk or drinks that contain milk.  2 hours before the procedure - stop drinking clear liquids.  Medicines  Ask your health care provider about: ? Changing or stopping your regular medicines. This is especially important if you are taking diabetes medicines or blood thinners. ? Taking medicines such as aspirin and ibuprofen. These medicines can thin your blood. Do not take these medicines before your procedure if your health care provider instructs you not to. ? Taking new dietary supplements or medicines. Do not take these during the week before your procedure unless your health care provider approves them.  If you are told to take a medicine or to continue taking a medicine on the day of the procedure, take the medicine with sips of water. General instructions   Ask if you will be going home the same day, the following day, or after a longer hospital stay. ? Plan to have someone take you home. ? Plan to have someone stay with you for the first 24 hours after you leave the hospital or clinic.  For 3-6 weeks before the procedure, try not to use any tobacco products, such as cigarettes, chewing tobacco, and e-cigarettes.  You may brush your teeth on the morning of the procedure, but make sure to spit out the toothpaste. What happens during the procedure?  You will be given anesthetics through a mask and through an IV tube in one of your veins.  You may receive medicine to help you relax (sedative).  As soon as you are asleep, a breathing tube may be used to help you breathe.  An anesthesia specialist will stay with you throughout the procedure. He or she will help keep you comfortable and safe by continuing to give you medicines and adjusting the amount of medicine that you  get. He or she will also watch your blood pressure, pulse, and oxygen levels to make sure that the anesthetics do not cause any problems.  If a breathing tube was used to help you breathe, it will be removed before you wake up. The procedure may vary among health care providers and hospitals. What happens after the procedure?  You will wake up, often slowly, after the procedure is complete, usually in a recovery area.  Your blood pressure, heart rate, breathing rate, and blood oxygen level will  be monitored until the medicines you were given have worn off.  You may be given medicine to help you calm down if you feel anxious or agitated.  If you will be going home the same day, your health care provider may check to make sure you can stand, drink, and urinate.  Your health care providers will treat your pain and side effects before you go home.  Do not drive for 24 hours if you received a sedative.  You may: ? Feel nauseous and vomit. ? Have a sore throat. ? Have mental slowness. ? Feel cold or shivery. ? Feel sleepy. ? Feel tired. ? Feel sore or achy, even in parts of your body where you did not have surgery. This information is not intended to replace advice given to you by your health care provider. Make sure you discuss any questions you have with your health care provider. Document Released: 01/10/2008 Document Revised: 03/15/2016 Document Reviewed: 09/17/2015 Elsevier Interactive Patient Education  2018 [place] Anesthesia, Adult, Care After These instructions provide you with information about caring for yourself after your procedure. Your health care provider may also give you more specific instructions. Your treatment has been planned according to current medical practices, but problems sometimes occur. Call your health care provider if you have any problems or questions after your procedure. What can I expect after the procedure? After the procedure, it is  common to have:  Vomiting.  A sore throat.  Mental slowness.  It is common to feel:  Nauseous.  Cold or shivery.  Sleepy.  Tired.  Sore or achy, even in parts of your body where you did not have surgery.  Follow these instructions at home: For at least 24 hours after the procedure:  Do not: ? Participate in activities where you could fall or become injured. ? Drive. ? Use heavy machinery. ? Drink alcohol. ? Take sleeping pills or medicines that cause drowsiness. ? Make important decisions or sign legal documents. ? Take care of children on your own.  Rest. Eating and drinking  If you vomit, drink water, juice, or soup when you can drink without vomiting.  Drink enough fluid to keep your urine clear or pale yellow.  Make sure you have little or no nausea before eating solid foods.  Follow the diet recommended by your health care provider. General instructions  Have a responsible adult stay with you until you are awake and alert.  Return to your normal activities as told by your health care provider. Ask your health care provider what activities are safe for you.  Take over-the-counter and prescription medicines only as told by your health care provider.  If you smoke, do not smoke without supervision.  Keep all follow-up visits as told by your health care provider. This is important. Contact a health care provider if:  You continue to have nausea or vomiting at home, and medicines are not helpful.  You cannot drink fluids or start eating again.  You cannot urinate after 8-12 hours.  You develop a skin rash.  You have fever.  You have increasing redness at the site of your procedure. Get help right away if:  You have difficulty breathing.  You have chest pain.  You have unexpected bleeding.  You feel that you are having a life-threatening or urgent problem. This information is not intended to replace advice given to you by your health care  provider. Make sure you discuss any questions you have with your health  care provider. Document Released: 01/09/2001 Document Revised: 03/07/2016 Document Reviewed: 09/17/2015 Elsevier Interactive Patient Education  [employer].

## 2017-11-15 NOTE — Telephone Encounter (Signed)
Informed pt of negative urine culture. DOB verified.

## 2017-11-17 ENCOUNTER — Other Ambulatory Visit: Payer: Self-pay | Admitting: Obstetrics and Gynecology

## 2017-11-17 ENCOUNTER — Encounter (HOSPITAL_COMMUNITY)
Admission: RE | Admit: 2017-11-17 | Discharge: 2017-11-17 | Disposition: A | Discharge status: Home / Self Care | Payer: Self-pay | Source: Ambulatory Visit | Attending: Obstetrics and Gynecology | Admitting: Obstetrics and Gynecology

## 2017-11-17 DIAGNOSIS — Z01812 Encounter for preprocedural laboratory examination: Secondary | ICD-10-CM | POA: Insufficient documentation

## 2017-11-17 LAB — CBC
HCT: 38.8 % (ref 36.0–46.0)
Hemoglobin: 13 g/dL (ref 12.0–15.0)
MCH: 30.4 pg (ref 26.0–34.0)
MCHC: 33.5 g/dL (ref 30.0–36.0)
MCV: 90.7 fL (ref 78.0–100.0)
PLATELETS: 238 10*3/uL (ref 150–400)
RBC: 4.28 MIL/uL (ref 3.87–5.11)
RDW: 12.5 % (ref 11.5–15.5)
WBC: 5.4 10*3/uL (ref 4.0–10.5)

## 2017-11-17 LAB — COMPREHENSIVE METABOLIC PANEL
ALT: 24 U/L (ref 14–54)
ANION GAP: 11 (ref 5–15)
AST: 21 U/L (ref 15–41)
Albumin: 3.9 g/dL (ref 3.5–5.0)
Alkaline Phosphatase: 54 U/L (ref 38–126)
BUN: 13 mg/dL (ref 6–20)
CHLORIDE: 103 mmol/L (ref 101–111)
CO2: 23 mmol/L (ref 22–32)
Calcium: 8.8 mg/dL — ABNORMAL LOW (ref 8.9–10.3)
Creatinine, Ser: 0.54 mg/dL (ref 0.44–1.00)
GFR calc Af Amer: >60 mL/min (ref >60)
Glucose, Bld: 107 mg/dL — ABNORMAL HIGH (ref 65–99)
POTASSIUM: 3.6 mmol/L (ref 3.5–5.1)
Sodium: 137 mmol/L (ref 135–145)
Total Bilirubin: 0.5 mg/dL (ref 0.3–1.2)
Total Protein: 7.2 g/dL (ref 6.5–8.1)

## 2017-11-17 LAB — TYPE AND SCREEN
ABO/RH(D): O POS
ANTIBODY SCREEN: NEGATIVE

## 2017-11-17 LAB — HCG, SERUM, QUALITATIVE: PREG SERUM: NEGATIVE

## 2017-11-17 NOTE — Progress Notes (Addendum)
Interpreter Donia Guiles CAP's with patient.

## 2017-11-22 ENCOUNTER — Ambulatory Visit: Payer: PRIVATE HEALTH INSURANCE | Admitting: Nurse Practitioner

## 2017-11-23 ENCOUNTER — Inpatient Hospital Stay (HOSPITAL_COMMUNITY): Admission: RE | Admit: 2017-11-23 | Payer: PRIVATE HEALTH INSURANCE | Source: Ambulatory Visit

## 2017-11-24 ENCOUNTER — Other Ambulatory Visit (HOSPITAL_COMMUNITY): Payer: PRIVATE HEALTH INSURANCE

## 2017-11-28 ENCOUNTER — Ambulatory Visit (HOSPITAL_COMMUNITY): Payer: Self-pay | Admitting: Anesthesiology

## 2017-11-28 ENCOUNTER — Ambulatory Visit (HOSPITAL_COMMUNITY)
Admission: RE | Admit: 2017-11-28 | Discharge: 2017-11-28 | Disposition: A | Discharge status: Home / Self Care | Payer: Self-pay | Source: Ambulatory Visit | Attending: Obstetrics and Gynecology | Admitting: Obstetrics and Gynecology

## 2017-11-28 ENCOUNTER — Encounter (HOSPITAL_COMMUNITY)
Admission: RE | Disposition: A | Discharge status: Home / Self Care | Payer: Self-pay | Source: Ambulatory Visit | Attending: Obstetrics and Gynecology

## 2017-11-28 ENCOUNTER — Other Ambulatory Visit: Payer: Self-pay

## 2017-11-28 ENCOUNTER — Encounter (HOSPITAL_COMMUNITY): Payer: Self-pay | Admitting: Emergency Medicine

## 2017-11-28 ENCOUNTER — Emergency Department (HOSPITAL_COMMUNITY)
Admission: EM | Admit: 2017-11-28 | Discharge: 2017-11-29 | Disposition: A | Discharge status: Home / Self Care | Payer: Self-pay | Attending: Emergency Medicine | Admitting: Emergency Medicine

## 2017-11-28 DIAGNOSIS — R112 Nausea with vomiting, unspecified: Secondary | ICD-10-CM | POA: Insufficient documentation

## 2017-11-28 DIAGNOSIS — N83292 Other ovarian cyst, left side: Secondary | ICD-10-CM

## 2017-11-28 DIAGNOSIS — K219 Gastro-esophageal reflux disease without esophagitis: Secondary | ICD-10-CM | POA: Insufficient documentation

## 2017-11-28 DIAGNOSIS — D271 Benign neoplasm of left ovary: Secondary | ICD-10-CM | POA: Insufficient documentation

## 2017-11-28 DIAGNOSIS — G8918 Other acute postprocedural pain: Secondary | ICD-10-CM | POA: Insufficient documentation

## 2017-11-28 DIAGNOSIS — Z9889 Other specified postprocedural states: Secondary | ICD-10-CM

## 2017-11-28 DIAGNOSIS — Z6841 Body Mass Index (BMI) 40.0 and over, adult: Secondary | ICD-10-CM | POA: Insufficient documentation

## 2017-11-28 DIAGNOSIS — Z79899 Other long term (current) drug therapy: Secondary | ICD-10-CM | POA: Insufficient documentation

## 2017-11-28 HISTORY — PX: LAPAROSCOPIC UNILATERAL SALPINGO OOPHERECTOMY: SHX5935

## 2017-11-28 SURGERY — SALPINGO-OOPHORECTOMY, UNILATERAL, LAPAROSCOPIC
Anesthesia: General | Laterality: Left

## 2017-11-28 MED ORDER — ONDANSETRON HCL 4 MG/2ML IJ SOLN
4.0000 mg | Freq: Once | INTRAMUSCULAR | Status: AC
  Administered 2017-11-28: 4 mg via INTRAVENOUS
  Filled 2017-11-28: qty 2

## 2017-11-28 MED ORDER — MIDAZOLAM HCL 2 MG/2ML IJ SOLN
1.0000 mg | INTRAMUSCULAR | Status: AC
  Administered 2017-11-28: 2 mg via INTRAVENOUS

## 2017-11-28 MED ORDER — ROCURONIUM BROMIDE 50 MG/5ML IV SOLN
INTRAVENOUS | Status: AC
  Filled 2017-11-28: qty 1

## 2017-11-28 MED ORDER — SODIUM CHLORIDE 0.9 % IJ SOLN
INTRAMUSCULAR | Status: AC
  Filled 2017-11-28: qty 10

## 2017-11-28 MED ORDER — FENTANYL CITRATE (PF) 250 MCG/5ML IJ SOLN
INTRAMUSCULAR | Status: AC
  Filled 2017-11-28: qty 5

## 2017-11-28 MED ORDER — MIDAZOLAM HCL 2 MG/2ML IJ SOLN
INTRAMUSCULAR | Status: AC
  Filled 2017-11-28: qty 2

## 2017-11-28 MED ORDER — DEXAMETHASONE SODIUM PHOSPHATE 4 MG/ML IJ SOLN
4.0000 mg | Freq: Once | INTRAMUSCULAR | Status: AC
  Administered 2017-11-28: 4 mg via INTRAVENOUS
  Filled 2017-11-28: qty 1

## 2017-11-28 MED ORDER — METRONIDAZOLE IN NACL 5-0.79 MG/ML-% IV SOLN
500.0000 mg | INTRAVENOUS | Status: AC
  Administered 2017-11-28: 500 mg via INTRAVENOUS
  Filled 2017-11-28: qty 100

## 2017-11-28 MED ORDER — FENTANYL CITRATE (PF) 100 MCG/2ML IJ SOLN
INTRAMUSCULAR | Status: DC | PRN
  Administered 2017-11-28 (×5): 50 ug via INTRAVENOUS

## 2017-11-28 MED ORDER — SUCCINYLCHOLINE CHLORIDE 20 MG/ML IJ SOLN
INTRAMUSCULAR | Status: AC
  Filled 2017-11-28: qty 1

## 2017-11-28 MED ORDER — SUGAMMADEX SODIUM 500 MG/5ML IV SOLN
INTRAVENOUS | Status: DC | PRN
  Administered 2017-11-28: 300 mg via INTRAVENOUS

## 2017-11-28 MED ORDER — SODIUM CHLORIDE 0.9% FLUSH
INTRAVENOUS | Status: AC
  Filled 2017-11-28: qty 10

## 2017-11-28 MED ORDER — SODIUM CHLORIDE 0.9 % IR SOLN
Status: DC | PRN
  Administered 2017-11-28: 3000 mL
  Administered 2017-11-28: 1000 mL

## 2017-11-28 MED ORDER — FENTANYL CITRATE (PF) 100 MCG/2ML IJ SOLN
INTRAMUSCULAR | Status: AC
  Filled 2017-11-28: qty 2

## 2017-11-28 MED ORDER — ROCURONIUM BROMIDE 100 MG/10ML IV SOLN
INTRAVENOUS | Status: DC | PRN
  Administered 2017-11-28: 10 mg via INTRAVENOUS
  Administered 2017-11-28: 5 mg via INTRAVENOUS
  Administered 2017-11-28: 35 mg via INTRAVENOUS
  Administered 2017-11-28: 10 mg via INTRAVENOUS

## 2017-11-28 MED ORDER — PROPOFOL 10 MG/ML IV BOLUS
INTRAVENOUS | Status: AC
Start: 2017-11-28 — End: ?
  Filled 2017-11-28: qty 40

## 2017-11-28 MED ORDER — EPHEDRINE SULFATE 50 MG/ML IJ SOLN
INTRAMUSCULAR | Status: AC
  Filled 2017-11-28: qty 1

## 2017-11-28 MED ORDER — OXYCODONE-ACETAMINOPHEN 5-325 MG PO TABS: 1.0000 | ORAL_TABLET | ORAL | 0 refills | Status: DC | PRN

## 2017-11-28 MED ORDER — LIDOCAINE HCL 1 % IJ SOLN
INTRAMUSCULAR | Status: DC | PRN
  Administered 2017-11-28: 25 mg via INTRADERMAL

## 2017-11-28 MED ORDER — PROPOFOL 10 MG/ML IV BOLUS
INTRAVENOUS | Status: DC | PRN
  Administered 2017-11-28: 150 mg via INTRAVENOUS

## 2017-11-28 MED ORDER — BUPIVACAINE HCL (PF) 0.5 % IJ SOLN
INTRAMUSCULAR | Status: DC | PRN
  Administered 2017-11-28: 8 mL

## 2017-11-28 MED ORDER — MIDAZOLAM HCL 5 MG/5ML IJ SOLN
INTRAMUSCULAR | Status: DC | PRN
  Administered 2017-11-28: 2 mg via INTRAVENOUS

## 2017-11-28 MED ORDER — BUPIVACAINE HCL (PF) 0.5 % IJ SOLN
INTRAMUSCULAR | Status: AC
  Filled 2017-11-28: qty 30

## 2017-11-28 MED ORDER — LIDOCAINE HCL (PF) 1 % IJ SOLN
INTRAMUSCULAR | Status: AC
  Filled 2017-11-28: qty 5

## 2017-11-28 MED ORDER — ATROPINE SULFATE 0.4 MG/ML IJ SOLN
INTRAMUSCULAR | Status: DC | PRN
  Administered 2017-11-28: 0.4 mg via INTRAVENOUS

## 2017-11-28 MED ORDER — FENTANYL CITRATE (PF) 100 MCG/2ML IJ SOLN
25.0000 ug | INTRAMUSCULAR | Status: DC | PRN
  Administered 2017-11-28 (×3): 50 ug via INTRAVENOUS
  Filled 2017-11-28: qty 2

## 2017-11-28 MED ORDER — KETOROLAC TROMETHAMINE 30 MG/ML IJ SOLN
30.0000 mg | Freq: Once | INTRAMUSCULAR | Status: AC
  Administered 2017-11-28: 30 mg via INTRAVENOUS
  Filled 2017-11-28: qty 1

## 2017-11-28 MED ORDER — ATROPINE SULFATE 0.4 MG/ML IJ SOLN
INTRAMUSCULAR | Status: AC
  Filled 2017-11-28: qty 2

## 2017-11-28 MED ORDER — ONDANSETRON 4 MG PO TBDP: 4.0000 mg | ORAL_TABLET | Freq: Four times a day (QID) | ORAL | 0 refills | Status: DC | PRN

## 2017-11-28 MED ORDER — LACTATED RINGERS IV SOLN
INTRAVENOUS | Status: DC
  Administered 2017-11-28 (×2): via INTRAVENOUS

## 2017-11-28 MED ORDER — SUCCINYLCHOLINE CHLORIDE 20 MG/ML IJ SOLN
INTRAMUSCULAR | Status: DC | PRN
  Administered 2017-11-28: 160 mg via INTRAVENOUS

## 2017-11-28 MED ORDER — CIPROFLOXACIN IN D5W 400 MG/200ML IV SOLN
400.0000 mg | INTRAVENOUS | Status: AC
  Administered 2017-11-28: 400 mg via INTRAVENOUS
  Filled 2017-11-28: qty 200

## 2017-11-28 SURGICAL SUPPLY — 50 items
BAG HAMPER (MISCELLANEOUS) ×3 IMPLANT
BAG RETRIEVAL 10MM (BASKET) ×1
BANDAGE STRIP 1X3 FLEXIBLE (GAUZE/BANDAGES/DRESSINGS) ×9 IMPLANT
BLADE SURG SZ11 CARB STEEL (BLADE) ×3 IMPLANT
CLOSURE WOUND 1/4 X3 (GAUZE/BANDAGES/DRESSINGS) ×1
CLOTH BEACON ORANGE TIMEOUT ST (SAFETY) ×3 IMPLANT
COVER LIGHT HANDLE STERIS (MISCELLANEOUS) ×6 IMPLANT
DURAPREP 26ML APPLICATOR (WOUND CARE) ×3 IMPLANT
ELECT REM PT RETURN 9FT ADLT (ELECTROSURGICAL) ×3
ELECTRODE REM PT RTRN 9FT ADLT (ELECTROSURGICAL) ×1 IMPLANT
FILTER SMOKE EVAC LAPAROSHD (FILTER) ×3 IMPLANT
GAUZE SPONGE 4X4 16PLY XRAY LF (GAUZE/BANDAGES/DRESSINGS) ×3 IMPLANT
GLOVE BIOGEL PI IND STRL 6.5 (GLOVE) ×1 IMPLANT
GLOVE BIOGEL PI IND STRL 7.0 (GLOVE) ×3 IMPLANT
GLOVE BIOGEL PI IND STRL 9 (GLOVE) ×1 IMPLANT
GLOVE BIOGEL PI INDICATOR 6.5 (GLOVE) ×2
GLOVE BIOGEL PI INDICATOR 7.0 (GLOVE) ×6
GLOVE BIOGEL PI INDICATOR 9 (GLOVE) ×2
GLOVE ECLIPSE 9.0 STRL (GLOVE) ×6 IMPLANT
GLOVE SURG SS PI 6.5 STRL IVOR (GLOVE) ×3 IMPLANT
GOWN SPEC L3 XXLG W/TWL (GOWN DISPOSABLE) ×3 IMPLANT
GOWN STRL REUS W/TWL LRG LVL3 (GOWN DISPOSABLE) ×3 IMPLANT
INST SET LAPROSCOPIC GYN AP (KITS) ×3 IMPLANT
IV NS IRRIG 3000ML ARTHROMATIC (IV SOLUTION) ×3 IMPLANT
KIT ROOM TURNOVER APOR (KITS) ×3 IMPLANT
MANIFOLD NEPTUNE II (INSTRUMENTS) ×3 IMPLANT
NEEDLE HYPO 25X1 1.5 SAFETY (NEEDLE) ×3 IMPLANT
NEEDLE INSUFFLATION 14GA 120MM (NEEDLE) ×3 IMPLANT
NS IRRIG 1000ML POUR BTL (IV SOLUTION) ×3 IMPLANT
PACK PERI GYN (CUSTOM PROCEDURE TRAY) ×3 IMPLANT
PAD ARMBOARD 7.5X6 YLW CONV (MISCELLANEOUS) ×3 IMPLANT
SET BASIN LINEN APH (SET/KITS/TRAYS/PACK) ×3 IMPLANT
SET TUBE IRRIG SUCTION NO TIP (IRRIGATION / IRRIGATOR) ×3 IMPLANT
SHEARS HARMONIC ACE PLUS 36CM (ENDOMECHANICALS) ×3 IMPLANT
SOLUTION ANTI FOG 6CC (MISCELLANEOUS) ×3 IMPLANT
STRIP CLOSURE SKIN 1/4X3 (GAUZE/BANDAGES/DRESSINGS) ×2 IMPLANT
SUT VIC AB 4-0 PS2 27 (SUTURE) ×3 IMPLANT
SUT VICRYL 0 UR6 27IN ABS (SUTURE) ×3 IMPLANT
SYR BULB IRRIGATION 50ML (SYRINGE) ×3 IMPLANT
SYR CONTROL 10ML LL (SYRINGE) ×3 IMPLANT
SYRINGE 10CC LL (SYRINGE) ×3 IMPLANT
SYS BAG RETRIEVAL 10MM (BASKET) ×2
SYSTEM BAG RETRIEVAL 10MM (BASKET) ×1 IMPLANT
TRAY FOLEY CATH SILVER 16FR (SET/KITS/TRAYS/PACK) ×3 IMPLANT
TROCAR ENDO BLADELESS 11MM (ENDOMECHANICALS) ×3 IMPLANT
TROCAR ENDO BLADELESS 12MM (ENDOMECHANICALS) ×3 IMPLANT
TROCAR XCEL NON-BLD 5MMX100MML (ENDOMECHANICALS) ×3 IMPLANT
TUBING INSUFFLATION (TUBING) ×3 IMPLANT
TUBING INSUFFLATION 10FT LAP (TUBING) ×3 IMPLANT
WARMER LAPAROSCOPE (MISCELLANEOUS) ×3 IMPLANT

## 2017-11-28 NOTE — Anesthesia Postprocedure Evaluation (Signed)
Anesthesia Post Note  Patient: Colleen Arnold  Procedure(s) Performed: LAPAROSCOPIC LEFT SALPINGO OOPHORECTOMY  (Left )  Patient location during evaluation: PACU Anesthesia Type: General Level of consciousness: awake and patient cooperative Pain management: pain level controlled Vital Signs Assessment: post-procedure vital signs reviewed and stable Respiratory status: spontaneous breathing, nonlabored ventilation, respiratory function stable and non-rebreather facemask Cardiovascular status: blood pressure returned to baseline Postop Assessment: no apparent nausea or vomiting Anesthetic complications: no     Last Vitals:  Vitals:   11/28/17 0718 11/28/17 0928  BP: 120/78 102/70  Pulse:  65  Resp:  16  Temp:  36.7 C  SpO2:  100%    Last Pain:  Vitals:   11/28/17 0928  TempSrc:   PainSc: 10-Worst pain ever                 Dreyden Rohrman J

## 2017-11-28 NOTE — ED Triage Notes (Signed)
Pt states she had laparoscopic fibroid surgery and is having increased nausea, vomiting and increased pain. Pt was told to come here to get stronger medication

## 2017-11-28 NOTE — Discharge Instructions (Signed)
Diagnostic Laparoscopy A diagnostic laparoscopy is a procedure to diagnose diseases in the abdomen. During the procedure, a thin, lighted, pencil-sized instrument called a laparoscope is inserted into the abdomen through an incision. The laparoscope allows your health care provider to look at the organs inside your body. Tell a health care provider about:  Any allergies you have.  All medicines you are taking, including vitamins, herbs, eye drops, creams, and over-the-counter medicines.  Any problems you or family members have had with anesthetic medicines.  Any blood disorders you have.  Any surgeries you have had.  Any medical conditions you have. What are the risks? Generally, this is a safe procedure. However, problems can occur, which may include:  Infection.  Bleeding.  Damage to other organs.  Allergic reaction to the anesthetics used during the procedure.  What happens before the procedure?  Do not eat or drink anything after midnight on the night before the procedure or as directed by your health care provider.  Ask your health care provider about: ? Changing or stopping your regular medicines. ? Taking medicines such as aspirin and ibuprofen. These medicines can thin your blood. Do not take these medicines before your procedure if your health care provider instructs you not to.  Plan to have someone take you home after the procedure. What happens during the procedure?  You may be given a medicine to help you relax (sedative).  You will be given a medicine to make you sleep (general anesthetic).  Your abdomen will be inflated with a gas. This will make your organs easier to see.  Small incisions will be made in your abdomen.  A laparoscope and other small instruments will be inserted into the abdomen through the incisions.  A tissue sample may be removed from an organ in the abdomen for examination.  The instruments will be removed from the abdomen.  The  gas will be released.  The incisions will be closed with stitches (sutures). What happens after the procedure? Your blood pressure, heart rate, breathing rate, and blood oxygen level will be monitored often until the medicines you were given have worn off. This information is not intended to replace advice given to you by your health care provider. Make sure you discuss any questions you have with your health care provider. Document Released: 01/09/2001 Document Revised: 02/11/2016 Document Reviewed: 05/16/2014 Elsevier Interactive Patient Education  2018 [employer]. Laparoscopy Salpingooferectoma unilateral - Cuidados posteriores (Unilateral Salpingo-Oophorectomy, Care After) Siga estas instrucciones durante las prximas semanas. Estas indicaciones le proporcionan informacin general acerca de cmo deber cuidarse despus del procedimiento. El mdico tambin podr darle instrucciones ms especficas. El tratamiento se ha planificado de acuerdo a las prcticas mdicas actuales, pero a veces se producen problemas. Comunquese con el mdico si tiene algn problema o tiene dudas despus del procedimiento. QU ESPERAR DESPUS DEL PROCEDIMIENTO Despus del procedimiento, es tpico tener las siguientes sensaciones:  Dolor abdominal que puede controlarse con medicamentos.  Prdida o hemorragia vaginal.  Estreimiento. INSTRUCCIONES PARA EL CUIDADO EN EL HOGAR   Descanse y duerma lo suficiente.  Tome slo medicamentos de venta libre o recetados, segn las indicaciones del mdico. No tome aspirina. Puede ocasionar hemorragias.  [place] y secas. Retire o cambie los apsitos (vendajes) tal como le indic su mdico.  Siga las indicaciones de su mdico con respecto a la dieta.  Beba suficiente lquido para Consulting civil engineer orina clara o de color amarillo plido.  Hepburn  indicaciones del mdico. No levante ningn objeto que sea ms pesado  que 5 libras (2.3 kg) hasta que el mdico la autorice.  No conduzca vehculos hasta que el mdico la autorice.  No beba alcohol hasta que el mdico la autorice.  No tenga relaciones sexuales hasta que el mdico la autorice.  Tmese la [employer] veces por da y Chartered certified accountant.  Si est constipada podr:  ? Consultar a su mdico si puede tomar un laxante suave. ? Agregar frutas y salvado a su dieta. ? Beber ms lquidos.  Concurra a las consultas de control con su mdico segn las indicaciones. SOLICITE ATENCIN MDICA SI:   La zona de la incisin est roja, se hincha o Engineer, water.  Le aparece una erupcin cutnea.  Se siente mareada.  Aumenta el dolor y no puede controlarlo con Conservation officer, nature.  Tiene dolor, enrojecimiento o hinchazn en la zona en la que fue colocada la va intravenosa. SOLICITE ATENCIN MDICA DE INMEDIATO SI:  Tiene fiebre.  Presenta un dolor abdominal cada vez ms intenso.  Tiene pus en la zona de la incisin, o la incisin se abre.  Advierte un olor ftido que proviene de la herida o del vendaje.  Tiene una hemorragia vaginal abundante.  Tiene Higher education careers adviser (nuseas).  Siente dolor en el pecho o en las piernas.  Siente dolor al [employer].  Le falta el aire.  Se desmaya. Esta informacin no tiene Marine scientist el consejo del mdico. Asegrese de hacerle al mdico cualquier pregunta que tenga. Document Released: 07/31/2009 Document Revised: 07/24/2013 Elsevier Interactive Patient Education  2017 [employer].

## 2017-11-28 NOTE — Op Note (Signed)
Please see the brief operative note for surgical details 

## 2017-11-28 NOTE — H&P (Signed)
Expand All Collapse All   Preoperative History and Physical  Colleen Arnold is a 32 y.o. G1P1001 here for surgical management of left adnexal cystic mass.   No significant preoperative concerns. Ca 125 normal at 5.3 vision here with translator Proposed surgery: LAPAROSCOPIC LEFT SALPINGO OOPHORECTOMY, POSSIBLE LAPAROTOMY       Past Medical History:  Diagnosis Date  . Bile duct stone   . Cholecystitis, acute with cholelithiasis   . Cyst, ovarian   . Elevated LFTs         Past Surgical History:  Procedure Laterality Date  . CESAREAN SECTION    . CHOLECYSTECTOMY    . ERCP N/A 10/06/2017   Procedure: ENDOSCOPIC RETROGRADE CHOLANGIOPANCREATOGRAPHY (ERCP);  Surgeon: Gatha Mayer, MD;  Location: [facility] ENDOSCOPY;  Service: Endoscopy;  Laterality: N/A;                   OB History  Gravida Para Term Preterm AB Living  1 1 1     1   SAB TAB Ectopic Multiple Live Births          1    # Outcome Date GA Lbr Len/2nd Weight Sex Delivery Anes PTL Lv  1 Term     F CS-Unspec   LIV    Patient denies any other pertinent gynecologic issues.         Current Outpatient Medications on File Prior to Visit  Medication Sig Dispense Refill  . HYDROcodone-acetaminophen (NORCO/VICODIN) 5-325 MG tablet Take 1-2 tablets by mouth every 6 (six) hours as needed for severe pain. 20 tablet 0  . OMEPRAZOLE PO Take by mouth 2 (two) times daily.     No current facility-administered medications on file prior to visit.        Allergies  Allergen Reactions  . Rocephin [Ceftriaxone Sodium In Dextrose] Rash    Social History:   reports that  has never smoked. she has never used smokeless tobacco. She reports that she does not drink alcohol or use drugs.       Family History  Problem Relation Age of Onset  . Hyperlipidemia Mother   . Diabetes Father   . Hypertension Brother   . Colon cancer Neg Hx     Review of Systems: Noncontributory  PHYSICAL EXAM: Blood  pressure 112/80, pulse 65, height 5\' 3"  (1.6 m), weight 231 lb 6.4 oz (105 kg), last menstrual period 10/15/2017. General appearance - alert, well appearing, and in no distress Chest - clear to auscultation, no wheezes, rales or rhonchi, symmetric air entry Heart - normal rate and regular rhythm Abdomen - soft, nontender, nondistended, no masses or organomegaly                     Tender to palpation in llq Pelvic - examination Efg normal, vagina normal, cervix midline, nonpurulent bimanual exam anterior uterus, fullness in left lower quadrant, left adnexa Extremities - peripheral pulses normal, no pedal edema, no clubbing or cyanosis  Labs:  CMP Latest Ref Rng & Units 11/17/2017 10/25/2017 10/07/2017  Glucose 65 - 99 mg/dL 107(H) - 96  BUN 6 - 20 mg/dL 13 - 7  Creatinine 0.44 - 1.00 mg/dL 0.54 - 0.60  Sodium 135 - 145 mmol/L 137 - 140  Potassium 3.5 - 5.1 mmol/L 3.6 - 3.5  Chloride 101 - 111 mmol/L 103 - 108  CO2 22 - 32 mmol/L 23 - 24  Calcium 8.9 - 10.3 mg/dL 8.8(L) - 8.8(L)  Total Protein 6.5 -  8.1 g/dL 7.2 7.1 6.8  Total Bilirubin 0.3 - 1.2 mg/dL 0.5 0.5 0.5  Alkaline Phos 38 - 126 U/L 54 52 90  AST 15 - 41 U/L 21 12 80(H)  ALT 14 - 54 U/L 24 20 212(H)     CBC    Component Value Date/Time   WBC 5.4 11/17/2017 1428   RBC 4.28 11/17/2017 1428   HGB 13.0 11/17/2017 1428   HCT 38.8 11/17/2017 1428   PLT 238 11/17/2017 1428   MCV 90.7 11/17/2017 1428   MCH 30.4 11/17/2017 1428   MCHC 33.5 11/17/2017 1428   RDW 12.5 11/17/2017 1428   LYMPHSABS 0.6 (L) 10/06/2017 0434   MONOABS 0.5 10/06/2017 0434   EOSABS 0.0 10/06/2017 0434   BASOSABS 0.0 10/06/2017 0434            CA 125   Collection Time: 11/03/17  1:38 PM  Result Value Ref Range   Cancer Antigen (CA) 125 5.3 0.0 - 38.1 U/mL    Imaging Studies:  ImagingResults  US Pelvis Transvanginal Non-ob (tv Only)  Result Date: 10/18/2017 CLINICAL DATA:  Abdominal pain. Followup left adnexal cystic lesion on recent  CT. EXAM: TRANSABDOMINAL AND TRANSVAGINAL ULTRASOUND OF PELVIS TECHNIQUE: Both transabdominal and transvaginal ultrasound examinations of the pelvis were performed. Transabdominal technique was performed for global imaging of the pelvis including uterus, ovaries, adnexal regions, and pelvic cul-de-sac. It was necessary to proceed with endovaginal exam following the transabdominal exam to visualize the cystic lesion in left adnexa. COMPARISON:  CT on 10/05/2017 FINDINGS: Uterus Measurements: 8.6 x 3.6 x 6.1 cm. No fibroids or other mass visualized. Endometrium Thickness: 4 mm.  No focal abnormality visualized. Right ovary Measurements: 3.8 x 2.1 x 2.9 cm. Normal appearance/no adnexal mass. Left ovary Measurements: No normal ovary visualized. A complex cystic lesion containing several thin internal septations is seen in the left adnexa. No thick septations or solid mural nodules identified. This lesion measures 7.4 by 5.0 x 6.5 cm, and has not significantly changed since previous study. Other findings No abnormal free fluid. IMPRESSION: 7.4 cm complex cystic lesion in left adnexa is unchanged in size since recent CT, and has indeterminate but probably benign characteristics. This is suggestive of a cystic ovarian neoplasm. Recommend correlation with tumor markers. Consider surgical evaluation or continued followup by ultrasound in 6-12 weeks. Normal appearance of uterus and right ovary. Electronically Signed   By: Earle Gell M.D.   On: 10/18/2017 09:43   US Pelvis (transabdominal Only)  Result Date: 10/18/2017 CLINICAL DATA:  Abdominal pain. Followup left adnexal cystic lesion on recent CT. EXAM: TRANSABDOMINAL AND TRANSVAGINAL ULTRASOUND OF PELVIS TECHNIQUE: Both transabdominal and transvaginal ultrasound examinations of the pelvis were performed. Transabdominal technique was performed for global imaging of the pelvis including uterus, ovaries, adnexal regions, and pelvic cul-de-sac. It was necessary to proceed  with endovaginal exam following the transabdominal exam to visualize the cystic lesion in left adnexa. COMPARISON:  CT on 10/05/2017 FINDINGS: Uterus Measurements: 8.6 x 3.6 x 6.1 cm. No fibroids or other mass visualized. Endometrium Thickness: 4 mm.  No focal abnormality visualized. Right ovary Measurements: 3.8 x 2.1 x 2.9 cm. Normal appearance/no adnexal mass. Left ovary Measurements: No normal ovary visualized. A complex cystic lesion containing several thin internal septations is seen in the left adnexa. No thick septations or solid mural nodules identified. This lesion measures 7.4 by 5.0 x 6.5 cm, and has not significantly changed since previous study. Other findings No abnormal free fluid. IMPRESSION: 7.4  cm complex cystic lesion in left adnexa is unchanged in size since recent CT, and has indeterminate but probably benign characteristics. This is suggestive of a cystic ovarian neoplasm. Recommend correlation with tumor markers. Consider surgical evaluation or continued followup by ultrasound in 6-12 weeks. Normal appearance of uterus and right ovary. Electronically Signed   By: Earle Gell M.D.   On: 10/18/2017 09:43     Assessment:     Patient Active Problem List   Diagnosis Date Noted  . Epigastric pain   . Intractable abdominal pain 10/06/2017  . LFT elevation 10/06/2017  . Ovarian mass, left 10/06/2017  . Biliary obstruction 10/06/2017  . Elevated LFTs   . Choledocholithiasis with obstruction     Plan: Patient will undergo surgical management with LAPAROSCOPIC LEFT SALPINGO OOPHORECTOMY, POSSIBLE LAPAROTOMY.  Patient counseled over process.  The patient had many questions regarding future fertility.  I explained to her that we may be able to salvage the left ovary if it is the tube that is the problem or the ovary may be removed and the tube salvage.  Either way patient will still have the right tube and ovary.  We will take photos to document the condition of the right tube  and ovary which would determine her future fertility for the most part.  Questions encouraged and answered.  We explained to the patient that we could not give absolute guarantees as to whether the mass was benign or whether it involved the tube, the ovary or both, and the tissue testing will be performed on the removed tissue .mec 11/10/2017 9:13 AM

## 2017-11-28 NOTE — Brief Op Note (Signed)
11/28/2017  9:26 AM  PATIENT:  Colleen Arnold  32 y.o. female  PRE-OPERATIVE DIAGNOSIS:  Symptomatic left ovarian mass with normal Ca125  POST-OPERATIVE DIAGNOSIS:  Symptomatic left ovarian mass with normal Ca125  PROCEDURE:  Procedure(s): LAPAROSCOPIC LEFT SALPINGO OOPHORECTOMY  (Left)  SURGEON:  Surgeon(s) and Role:    Jonnie Kind, MD - Primary  PHYSICIAN ASSISTANT:   ASSISTANTS: none   ANESTHESIA:   local and general  EBL:  20 mL   BLOOD ADMINISTERED:none  DRAINS: none   LOCAL MEDICATIONS USED:  MARCAINE    and Amount: 8 ml  SPECIMEN:  Source of Specimen:  Left tube and ovary  DISPOSITION OF SPECIMEN:  PATHOLOGY  COUNTS:  YES  TOURNIQUET:  * No tourniquets in log *  DICTATION: .Dragon Dictation  PLAN OF CARE: Discharge to home after PACU  PATIENT DISPOSITION:  PACU - hemodynamically stable.   Delay start of Pharmacological VTE agent (>24hrs) due to surgical blood loss or risk of bleeding: not applicable Details of procedure: Patient was taken the operating room prepped and draped for combined abdominal and vaginal procedure with Foley catheter in place, Hulka tenaculum attached to the cervix with a single-tooth tenaculum on the anterior lip as well.  Timeout was conducted.  Antibiotics administered, and procedure initiated.  Vertical 1 cm subumbilical incision was made as well as a transverse supra pubic incision 2 cm in length just above the old C-section scar and a 1 cm incision in the right lower quadrant.  Veress needle was introduced through the umbilicus achieving pneumoperitoneum under initially 8 mmHg intra-abdominal pressure reaching 15 mm upon insufflation of 3 L of CO2.  The subumbilical trocar was inserted without difficulty and the pelvis inspected.  There were omental adhesions to the anterior abdominal wall.  Suprapubic trocar was placed.  At this time the patient experienced a bradycardia to the 30s from her baseline in the 60s, left beginning  within 30 seconds of initiation of bradycardia reached its lowest number, and gradually recovered in response to atropine, deflation of the abdomen and waiting patiently.  Upon full recovery we reinsufflated, inspected the abdomen confirmed that abdominal process was were normal, placed the third 5 mm trocar in the right lower quadrant to go along with a 12 mm suprapubic and 11 mm subumbilical trochars.  The attention was then first directed to the pelvis.  The left tube and ovary were flipped anterior to the round ligament with some parasitic adhesions to the.  There was an additional number of adhesions to the subumbilical area with omental attachments to the anterior abdominal wall which was taken down sharply with harmonic a 7 dissector.  The attention was then directed to the left adnexa.  The right adnexa was first inspected and confirmed that there was normal-appearing tube and ovary on the right side and the uterus itself appeared normal.  The thin filmy adhesions to the anterior abdominal wall  in the left lower quadrant were taken down sharply with the harmonic ACE 7,, and normal anatomy restored.  The fallopian tube was then amputated right at the cornea, and then the attachments of the tube and ovary to the left IP ligament and utero-ovarian the harmonic a 7 without difficulty.  We stayed well away from the pelvic sidewall.  Hemostasis was excellent.  The specimen was captured in an Endo Catch bag placed through the suprapubic trocar and brought up to the abdominal wall.  The fascial layer required opening on either side of the  bag approximately 1/2 cm to allow for pulling the bag up to where it can be accessed externally.  The bag was opened at its top, and the Veress needle used to penetrate the ovarian cyst and a way that did not contaminate the incision of the abdominal cavity.  The laparoscopic Veress needle was inserted into the cyst, drained into a 10 mm syringe and aspirated with the suction  aspirator.   At this time the Endo Catch bag could be extracted without difficulty.  The abdomen was reinsufflated with the trocar sleeves placed back in the suprapubic site and the pelvis inspected and final photos taken.  There is no evidence of bleeding or no suspicion of complications. Saline was instilled in the abdomen 50 cc to allow for evacuation of the pneumoperitoneum effectively.  The abdomen was decompressed, laparoscopic trochars removed under direct visualization to the laparoscopic camera except for the umbilical site which was taken out passively upon deflation of the abdomen.  The fascia was closed at the suprapubic and umbilical site.  Suprapubic site was easily reapproximated using a running 0 Vicryl closure of the 4 cm fascial defect with good tissue approximation.  The umbilical site was a much smaller opening, more difficult to access but was carefully closed incorporating the upper edge of the fascia and some Scarpa's fascia into the suture.  Palpation showed a postsurgical defect that was very small less than 5 mm.  This was to the left of the umbilical stalk.  And felt this was felt to be satisfactorily closed.  The incisions were then closed at the subcuticular level with 4-0 Vicryl and Steri-Strips applied sponge and needle counts correct and the Hulka tenaculum taken off the cervix sponge and needle counts were correct.

## 2017-11-28 NOTE — Transfer of Care (Signed)
Immediate Anesthesia Transfer of Care Note  Patient: Colleen Arnold  Procedure(s) Performed: LAPAROSCOPIC LEFT SALPINGO OOPHORECTOMY  (Left )  Patient Location: PACU  Anesthesia Type:General  Level of Consciousness: awake and patient cooperative  Airway & Oxygen Therapy: Patient Spontanous Breathing and Patient connected to face mask oxygen  Post-op Assessment: Report given to RN, Post -op Vital signs reviewed and stable and Patient moving all extremities  Post vital signs: Reviewed and stable  Last Vitals:  Vitals:   11/28/17 0711 11/28/17 0718  BP:  120/78  Pulse: 69   Resp: 18   Temp: 36.9 C   SpO2: 98%     Last Pain:  Vitals:   11/28/17 0711  TempSrc: Oral      Patients Stated Pain Goal: 5 (15/04/13 6438)  Complications: No apparent anesthesia complications

## 2017-11-28 NOTE — Anesthesia Preprocedure Evaluation (Signed)
Anesthesia Evaluation  Patient identified by MRN, date of birth, ID band Patient awake    Reviewed: Allergy & Precautions, NPO status , Patient's Chart, lab work & pertinent test results  Airway Mallampati: III  TM Distance: >3 FB Neck ROM: Full    Dental  (+) Teeth Intact   Pulmonary neg pulmonary ROS,    breath sounds clear to auscultation       Cardiovascular negative cardio ROS   Rhythm:Regular Rate:Normal     Neuro/Psych negative neurological ROS  negative psych ROS   GI/Hepatic Neg liver ROS, GERD  Controlled and Medicated,  Endo/Other  Morbid obesity  Renal/GU negative Renal ROS     Musculoskeletal   Abdominal   Peds  Hematology   Anesthesia Other Findings   Reproductive/Obstetrics                             Anesthesia Physical Anesthesia Plan  ASA: II  Anesthesia Plan: General   Post-op Pain Management:    Induction: Intravenous, Rapid sequence and Cricoid pressure planned  PONV Risk Score and Plan:   Airway Management Planned: Oral ETT and Video Laryngoscope Planned  Additional Equipment:   Intra-op Plan:   Post-operative Plan: Extubation in OR  Informed Consent: I have reviewed the patients History and Physical, chart, labs and discussed the procedure including the risks, benefits and alternatives for the proposed anesthesia with the patient or authorized representative who has indicated his/her understanding and acceptance.     Plan Discussed with:   Anesthesia Plan Comments:         Anesthesia Quick Evaluation

## 2017-11-28 NOTE — Anesthesia Procedure Notes (Signed)
Procedure Name: Intubation Date/Time: 11/28/2017 7:46 AM Performed by: Charmaine Downs, CRNA Pre-anesthesia Checklist: Patient identified, Patient being monitored, Timeout performed, Emergency Drugs available and Suction available Patient Re-evaluated:Patient Re-evaluated prior to induction Oxygen Delivery Method: Circle System Utilized Preoxygenation: Pre-oxygenation with 100% oxygen Induction Type: IV induction, Rapid sequence and Cricoid Pressure applied Ventilation: Mask ventilation without difficulty Laryngoscope Size: Glidescope Grade View: Grade II Tube type: Oral Tube size: 7.0 mm Number of attempts: 1 Airway Equipment and Method: Rigid stylet and Video-laryngoscopy Placement Confirmation: ETT inserted through vocal cords under direct vision,  positive ETCO2 and breath sounds checked- equal and bilateral Secured at: 22 cm Tube secured with: Tape Dental Injury: Teeth and Oropharynx as per pre-operative assessment  Difficulty Due To: Difficulty was anticipated and Difficult Airway- due to limited oral opening Future Recommendations: Recommend- induction with short-acting agent, and alternative techniques readily available

## 2017-11-29 LAB — BASIC METABOLIC PANEL
ANION GAP: 9 (ref 5–15)
BUN: 12 mg/dL (ref 6–20)
CALCIUM: 8.5 mg/dL — AB (ref 8.9–10.3)
CO2: 22 mmol/L (ref 22–32)
Chloride: 103 mmol/L (ref 101–111)
Creatinine, Ser: 0.64 mg/dL (ref 0.44–1.00)
Glucose, Bld: 109 mg/dL — ABNORMAL HIGH (ref 65–99)
POTASSIUM: 3.4 mmol/L — AB (ref 3.5–5.1)
Sodium: 134 mmol/L — ABNORMAL LOW (ref 135–145)

## 2017-11-29 LAB — CBC WITH DIFFERENTIAL/PLATELET
BASOS ABS: 0 10*3/uL (ref 0.0–0.1)
BASOS PCT: 0 %
Eosinophils Absolute: 0 10*3/uL (ref 0.0–0.7)
Eosinophils Relative: 0 %
HEMATOCRIT: 37.4 % (ref 36.0–46.0)
HEMOGLOBIN: 12.5 g/dL (ref 12.0–15.0)
LYMPHS PCT: 23 %
Lymphs Abs: 2.1 10*3/uL (ref 0.7–4.0)
MCH: 30.3 pg (ref 26.0–34.0)
MCHC: 33.4 g/dL (ref 30.0–36.0)
MCV: 90.6 fL (ref 78.0–100.0)
MONO ABS: 0.8 10*3/uL (ref 0.1–1.0)
Monocytes Relative: 9 %
NEUTROS ABS: 6 10*3/uL (ref 1.7–7.7)
NEUTROS PCT: 68 %
Platelets: 215 10*3/uL (ref 150–400)
RBC: 4.13 MIL/uL (ref 3.87–5.11)
RDW: 12.3 % (ref 11.5–15.5)
WBC: 9 10*3/uL (ref 4.0–10.5)

## 2017-11-29 MED ORDER — DIPHENHYDRAMINE HCL 50 MG/ML IJ SOLN
25.0000 mg | Freq: Once | INTRAMUSCULAR | Status: AC
  Administered 2017-11-29: 25 mg via INTRAVENOUS
  Filled 2017-11-29: qty 1

## 2017-11-29 MED ORDER — SODIUM CHLORIDE 0.9 % IV BOLUS (SEPSIS)
1000.0000 mL | Freq: Once | INTRAVENOUS | Status: AC
  Administered 2017-11-29: 1000 mL via INTRAVENOUS

## 2017-11-29 MED ORDER — HYDROMORPHONE HCL 1 MG/ML IJ SOLN
1.0000 mg | Freq: Once | INTRAMUSCULAR | Status: AC
  Administered 2017-11-29: 1 mg via INTRAVENOUS
  Filled 2017-11-29: qty 1

## 2017-11-29 MED ORDER — METOCLOPRAMIDE HCL 5 MG/ML IJ SOLN
10.0000 mg | Freq: Once | INTRAMUSCULAR | Status: AC
  Administered 2017-11-29: 10 mg via INTRAVENOUS
  Filled 2017-11-29: qty 2

## 2017-11-29 MED ORDER — PROCHLORPERAZINE EDISYLATE 5 MG/ML IJ SOLN
10.0000 mg | Freq: Once | INTRAMUSCULAR | Status: AC
  Administered 2017-11-29: 10 mg via INTRAVENOUS
  Filled 2017-11-29: qty 2

## 2017-11-29 MED ORDER — PROCHLORPERAZINE MALEATE 10 MG PO TABS: 10.0000 mg | ORAL_TABLET | Freq: Four times a day (QID) | ORAL | 0 refills | Status: DC | PRN

## 2017-11-29 MED ORDER — HYDROMORPHONE HCL 2 MG PO TABS: 2.0000 mg | ORAL_TABLET | ORAL | 0 refills | Status: DC | PRN

## 2017-11-29 NOTE — ED Provider Notes (Signed)
[facility] EMERGENCY DEPARTMENT Provider Note   CSN: [id number] Arrival date & time: 11/28/17  1951     History   Chief Complaint Chief Complaint  Patient presents with  . Emesis    HPI Colleen Arnold is a 32 y.o. female.  The history is provided by the patient.  She has a history of ovarian cyst, status post laparoscopic oophorectomy and salpingectomy earlier today and comes in because of inability to control pain and nausea.  She was discharged with prescription for oxycodone-acetaminophen and ondansetron, but she is still complaining of severe abdominal pain and vomiting in spite of those.  She is felt chilled but has not had any fever or sweats.  She rates pain at 8/10.  She called her obstetrician who advised she come to the ED.  Past Medical History:  Diagnosis Date  . Bile duct stone   . Cholecystitis, acute with cholelithiasis   . Cyst, ovarian   . Elevated LFTs     Patient Active Problem List   Diagnosis Date Noted  . Epigastric pain   . Intractable abdominal pain 10/06/2017  . LFT elevation 10/06/2017  . Ovarian mass, left 10/06/2017  . Biliary obstruction 10/06/2017  . Elevated LFTs   . Choledocholithiasis with obstruction     Past Surgical History:  Procedure Laterality Date  . CESAREAN SECTION    . CHOLECYSTECTOMY    . ERCP N/A 10/06/2017   Procedure: ENDOSCOPIC RETROGRADE CHOLANGIOPANCREATOGRAPHY (ERCP);  Surgeon: Gatha Mayer, MD;  Location: [facility] ENDOSCOPY;  Service: Endoscopy;  Laterality: N/A;    OB History    Gravida Para Term Preterm AB Living   1 1 1     1    SAB TAB Ectopic Multiple Live Births           1       Home Medications    Prior to Admission medications   Medication Sig Start Date End Date Taking? Authorizing Provider  Chlorpheniramine-PSE-Ibuprofen (ADVIL ALLERGY SINUS PO) Take 1 tablet by mouth daily as needed (sinuses).    [provider]  cholecalciferol (VITAMIN D) 1000 units tablet Take 1,000 Units by mouth 2  (two) times daily.    [provider]  omeprazole (PRILOSEC OTC) 20 MG tablet Take 20 mg by mouth 2 (two) times daily.    [provider]  ondansetron (ZOFRAN ODT) 4 MG disintegrating tablet Take 1 tablet (4 mg total) by mouth every 6 (six) hours as needed for nausea. 11/28/17   Jonnie Kind, MD  oxyCODONE-acetaminophen (PERCOCET/ROXICET) 5-325 MG tablet Take 1-2 tablets by mouth every 4 (four) hours as needed for moderate pain or severe pain. 11/28/17   Jonnie Kind, MD    Family History Family History  Problem Relation Age of Onset  . Hyperlipidemia Mother   . Diabetes Father   . Hypertension Brother   . Colon cancer Neg Hx     Social History Social History   Tobacco Use  . Smoking status: Never Smoker  . Smokeless tobacco: Never Used  Substance Use Topics  . Alcohol use: No    Frequency: Never  . Drug use: No     Allergies   Rocephin [ceftriaxone sodium in dextrose]   Review of Systems Review of Systems  All other systems reviewed and are negative.    Physical Exam Updated Vital Signs BP 109/66 (BP Location: Left Arm)   Pulse 62   Temp 98.2 F (36.8 C) (Oral)   Resp 18   Ht  5\' 3"  (1.6 m)   Wt 104.3 kg (230 lb)   LMP 11/12/2017   SpO2 98%   BMI 40.74 kg/m   Physical Exam  Nursing note and vitals reviewed.  32 year old female, resting comfortably and in no acute distress. Vital signs are normal. Oxygen saturation is 98%, which is normal. Head is normocephalic and atraumatic. PERRLA, EOMI. Oropharynx is clear. Neck is nontender and supple without adenopathy or JVD. Back is nontender and there is no CVA tenderness. Lungs are clear without rales, wheezes, or rhonchi. Chest is nontender. Heart has regular rate and rhythm without murmur. Abdomen is soft, flat, with mild tenderness diffusely.  Scars from laparoscopic surgery are present and healing well without signs of inflammation or infection.  There are no masses or  hepatosplenomegaly and peristalsis is hypoactive. Extremities have no cyanosis or edema, full range of motion is present. Skin is warm and dry without rash. Neurologic: Mental status is normal, cranial nerves are intact, there are no motor or sensory deficits.  ED Treatments / Results  Labs (all labs ordered are listed, but only abnormal results are displayed) Labs Reviewed  BASIC METABOLIC PANEL - Abnormal; Notable for the following components:      Result Value   Sodium 134 (*)    Potassium 3.4 (*)    Glucose, Bld 109 (*)    Calcium 8.5 (*)    All other components within normal limits  CBC WITH DIFFERENTIAL/PLATELET   Procedures Procedures  Medications Ordered in ED Medications  sodium chloride 0.9 % bolus 1,000 mL (1,000 mLs Intravenous New Bag/Given 11/29/17 0047)  HYDROmorphone (DILAUDID) injection 1 mg (1 mg Intravenous Given 11/29/17 0051)  metoCLOPramide (REGLAN) injection 10 mg (10 mg Intravenous Given 11/29/17 0048)  diphenhydrAMINE (BENADRYL) injection 25 mg (25 mg Intravenous Given 11/29/17 0047)  HYDROmorphone (DILAUDID) injection 1 mg (1 mg Intravenous Given 11/29/17 0231)  prochlorperazine (COMPAZINE) injection 10 mg (10 mg Intravenous Given 11/29/17 0231)     Initial Impression / Assessment and Plan / ED Course  I have reviewed the triage vital signs and the nursing notes.  Pertinent lab results that were available during my care of the patient were reviewed by me and considered in my medical decision making (see chart for details).  Postoperative pain and nausea and vomiting.  She is given IV fluids as well as IV hydromorphone and metoclopramide.  Old records are reviewed confirming laparoscopic oophorectomy and salpingectomy for ovarian cyst done earlier today.  Following above-noted treatment, patient stated that she was not feeling any better.  She is given an additional hydromorphone and given a dose of prochlorperazine with significant symptomatic relief.   Laboratory work is reassuring.  She is discharged with prescription for a small number of hydromorphone tablets, and prescription for prochlorperazine.  She is to continue usual postoperative care and follow-up with Dr. Glo Herring as scheduled.  Final Clinical Impressions(s) / ED Diagnoses   Final diagnoses:  Postoperative pain  Postoperative nausea and vomiting    ED Discharge Orders        Ordered    HYDROmorphone (DILAUDID) 2 MG tablet  Every 4 hours PRN     11/29/17 0409    prochlorperazine (COMPAZINE) 10 MG tablet  Every 6 hours PRN     33/29/51 8841       Delora Fuel, MD 66/06/30 [phone]

## 2017-11-30 ENCOUNTER — Encounter (HOSPITAL_COMMUNITY): Payer: Self-pay | Admitting: Obstetrics and Gynecology

## 2017-11-30 ENCOUNTER — Telehealth: Payer: Self-pay | Admitting: Obstetrics and Gynecology

## 2017-11-30 NOTE — Telephone Encounter (Signed)
LMOVM returning patient's call.  

## 2017-11-30 NOTE — Telephone Encounter (Signed)
Patient asked if she could shower. Informed patient that was fine but to leave steri strips applied that they would come off when ready. Also advised to pad areas dry. Verbalized understanding.

## 2017-12-04 ENCOUNTER — Encounter: Payer: Self-pay | Admitting: Obstetrics and Gynecology

## 2017-12-04 ENCOUNTER — Ambulatory Visit (INDEPENDENT_AMBULATORY_CARE_PROVIDER_SITE_OTHER): Payer: Self-pay | Admitting: Obstetrics and Gynecology

## 2017-12-04 VITALS — BP 114/60 | HR 78 | Ht 63.0 in | Wt 222.8 lb

## 2017-12-04 DIAGNOSIS — Z4889 Encounter for other specified surgical aftercare: Secondary | ICD-10-CM

## 2017-12-04 DIAGNOSIS — Z9889 Other specified postprocedural states: Secondary | ICD-10-CM

## 2017-12-04 NOTE — Progress Notes (Addendum)
   Subjective:  Colleen Arnold is a 32 y.o. female now 1 weeks status post laparoscopic left salpingo oophorectomy.   Review of Systems Negative except some concern for her right incision being open   Diet:   normal   Bowel movements : normal.  Pain is controlled with current analgesics. Medications being used: dilaudid, percocet.  Objective:  BP 114/60 (BP Location: Right Arm, Patient Position: Sitting, Cuff Size: Large)   Pulse 78   Ht 5\' 3"  (1.6 m)   Wt 222 lb 12.8 oz (101.1 kg)   LMP 11/12/2017   BMI 39.47 kg/m  General:Well developed, well nourished.  No acute distress. Abdomen: Bowel sounds normal, soft, non-tender. Pelvic Exam:  Incision(s):   Healing well, no drainage, no erythema, no hernia, no swelling, no dehiscence, some puffiness present patient reassured Patient took photographs of the pictures of the surgery in the media section of the chart so she can send it to her mother who does not live here   Assessment:  Post-Op 1 weeks s/p laparoscopic left salpingo oophorectomy    Doing well postoperatively.   Plan:  1.Wound care discussed 2. current medications: dilaudid to be discontinued Percocet also should be okay on just ibuprofen 3. Activity restrictions: none 4. return to work: now. 5. Follow up PRN.   By signing my name below, I, [name], attest that this documentation has been prepared under the direction and in the presence of Jonnie Kind, MD. Electronically Signed: Jabier Gauss, Medical Scribe. 12/04/17. 10:29 AM.  I personally performed the services described in this documentation, which was SCRIBED in my presence. The recorded information has been reviewed and considered accurate. It has been edited as necessary during review. Jonnie Kind, MD

## 2017-12-06 ENCOUNTER — Encounter: Payer: Self-pay | Admitting: Obstetrics and Gynecology

## 2017-12-11 ENCOUNTER — Telehealth: Payer: Self-pay | Admitting: *Deleted

## 2017-12-11 ENCOUNTER — Ambulatory Visit: Payer: Self-pay

## 2017-12-11 NOTE — Telephone Encounter (Signed)
Patient states she is having bleeding from her incision at her belly button and is having "white stringy drainage" coming out as well.  When asked how much and how long bleeding has been happening, pt unable to give any details. Pt to come for nurse to assess to see if she needs to be seen.

## 2018-01-31 ENCOUNTER — Ambulatory Visit: Payer: Self-pay | Admitting: Obstetrics and Gynecology

## 2018-02-01 ENCOUNTER — Telehealth: Payer: Self-pay | Admitting: Obstetrics and Gynecology

## 2018-02-01 NOTE — Telephone Encounter (Signed)
Patient informed Dr Glo Herring is out of the office until Monday.  Advised if she was still feeling bad and needed to be seen, needed to go to the ER.  Patient informed she has an appointment on Wednesday.  Verbalized understanding.

## 2018-02-07 ENCOUNTER — Ambulatory Visit (INDEPENDENT_AMBULATORY_CARE_PROVIDER_SITE_OTHER): Payer: Self-pay | Admitting: Obstetrics and Gynecology

## 2018-02-07 ENCOUNTER — Encounter: Payer: Self-pay | Admitting: Obstetrics and Gynecology

## 2018-02-07 VITALS — BP 124/68 | HR 76 | Ht 63.0 in | Wt 234.6 lb

## 2018-02-07 DIAGNOSIS — K432 Incisional hernia without obstruction or gangrene: Secondary | ICD-10-CM

## 2018-02-07 DIAGNOSIS — N839 Noninflammatory disorder of ovary, fallopian tube and broad ligament, unspecified: Secondary | ICD-10-CM

## 2018-02-07 DIAGNOSIS — N838 Other noninflammatory disorders of ovary, fallopian tube and broad ligament: Secondary | ICD-10-CM

## 2018-02-07 DIAGNOSIS — R42 Dizziness and giddiness: Secondary | ICD-10-CM

## 2018-02-07 DIAGNOSIS — R110 Nausea: Secondary | ICD-10-CM

## 2018-02-07 DIAGNOSIS — N898 Other specified noninflammatory disorders of vagina: Secondary | ICD-10-CM

## 2018-02-07 NOTE — Progress Notes (Signed)
[facility] Visit  02/07/2018            Patient name: Colleen Arnold MRN [id number]  Date of birth: 02-10-1986  CC & HPI:  Colleen Arnold is a 32 y.o. female presenting today for intermittent pain at her belly button since the beginning of april. She had a left laparoscopic salpingo oophorectomy in Feb '19. Pt note she occasionally feels a firm ball in the area of her incision. She has associated symptoms of numbness and tingling around her eyes, dizziness and nausea, LLQ pain and inflammation  during her period in April, and malodorous vaginal discharge. Patient's last menstrual period was 01/19/2018. She has not tried any medication for relief. She notes no alleviating factors. She is concerned the firm ball may get bigger. Pt would also like to know if blood tests can be ordered due to her issues with her eyes and the nausea/dizziness.  Pt accompanied by Spanish translator  ROS:  ROS (+) periumbilical pain (+) nausea (+) dizziness (+) malodorous vaginal discharge (+) numbness and tingling of eyes All systems are negative except as noted in the HPI and PMH.   Pertinent History Reviewed:   Reviewed: Significant for C/S, left laparoscopic salpingo-oophorectomy Medical         Past Medical History:  Diagnosis Date  . Bile duct stone   . Cholecystitis, acute with cholelithiasis   . Cyst, ovarian   . Elevated LFTs                               Surgical Hx:    Past Surgical History:  Procedure Laterality Date  . CESAREAN SECTION    . CHOLECYSTECTOMY    . ERCP N/A 10/06/2017   Procedure: ENDOSCOPIC RETROGRADE CHOLANGIOPANCREATOGRAPHY (ERCP);  Surgeon: Gatha Mayer, MD;  [facility] ENDOSCOPY;  Service: Endoscopy;  Laterality: N/A;  . LAPAROSCOPIC UNILATERAL SALPINGO OOPHERECTOMY Left 11/28/2017   Procedure: LAPAROSCOPIC LEFT SALPINGO OOPHORECTOMY ;  Surgeon: Jonnie Kind, MD;  Location: [facility];  Service: Gynecology;  Laterality: Left;   Medications: Reviewed &  Updated - see associated section                       Current Outpatient Medications:  .  cholecalciferol (VITAMIN D) 1000 units tablet, Take 1,000 Units by mouth 2 (two) times daily., Disp: , Rfl:  .  Fexofenadine HCl (ALLEGRA PO), Take 180 mg by mouth daily., Disp: , Rfl:    Social History: Reviewed -  reports that she has never smoked. She has never used smokeless tobacco.  Objective Findings:  Vitals: Blood pressure 124/68, pulse 76, height 5\' 3"  (1.6 m), weight 234 lb 9.6 oz (106.4 kg), last menstrual period 01/19/2018.  PHYSICAL EXAMINATION General appearance - alert, well appearing, and in no distress and oriented to person, place, and time Mental status - alert, oriented to person, place, and time, normal mood, behavior, speech, dress, motor activity, and thought processes  Abdomen - well healed incision, Small 1cm incisional hernia, at the side of the umbilical trochar, mildly tender  PELVIC External genitalia - normal Vulva - normal Vagina - normal healthy tissues, no inflammation noted Cervix - normal  Uterus - normal  Adnexa -    Assessment & Plan:   A:  1.  Small 1cm incisional hernia, at the side of the umbilical trochar, mildly tender, reducible.  P:  1.  F/u PRN 2  patient requests "lab tests" for nausea that is intermittent, referred to health Dept.   By signing my name below, I, Colleen Arnold, attest that this documentation has been prepared under the direction and in the presence of Jonnie Kind, MD. Electronically Signed: Jabier Gauss, Medical Scribe. 02/07/18. 11:49 AM.  I personally performed the services described in this documentation, which was SCRIBED in my presence. The recorded information has been reviewed and considered accurate. It has been edited as necessary during review. Jonnie Kind, MD

## 2018-09-22 IMAGING — CT CT ABD-PELV W/ CM
2 of 4 series · 16 of 46 positions shown, 18 images · IV contrast (APPLIED)
Comparison: CT abdomen pelvis dated September 24, 2017.

CLINICAL DATA: Progressively worsening abdominal pain over the past
year. Associated nausea and vomiting.

EXAM:
CT ABDOMEN AND PELVIS WITH CONTRAST
TECHNIQUE: Multidetector CT imaging of the abdomen and pelvis was performed
using the standard protocol following bolus administration of
intravenous contrast.
CONTRAST:  100 mL 5DQJLX-WXX IOPAMIDOL (5DQJLX-WXX) INJECTION 61%

[Series 3: abd/ pelvis 5.0 i30f 2 · axial · 0.98mm/px · z∈[-409,+31]mm · 13 of 98 slices shown, 15 images]
[im 5/98  soft-tissue]
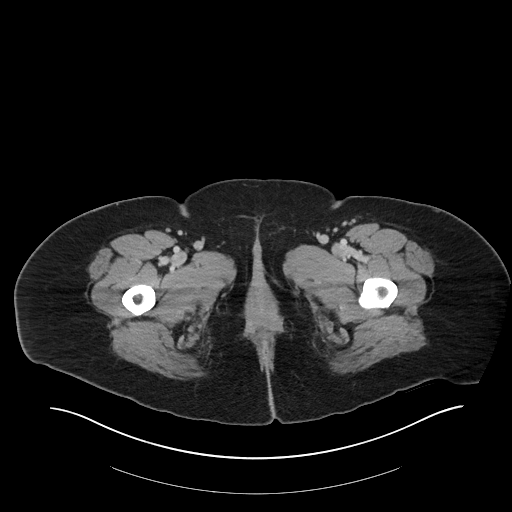
[im 5/98  bone]
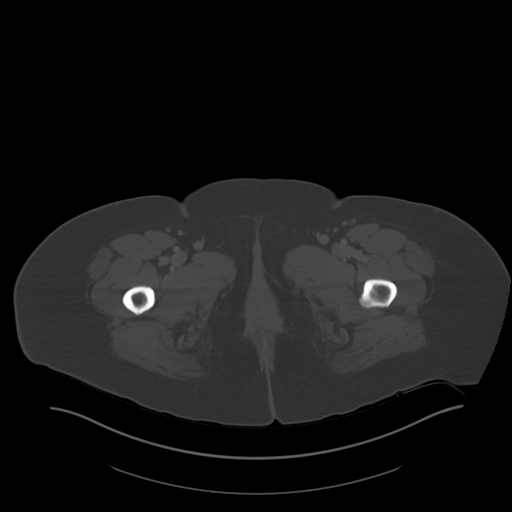
[im 13/98  soft-tissue]
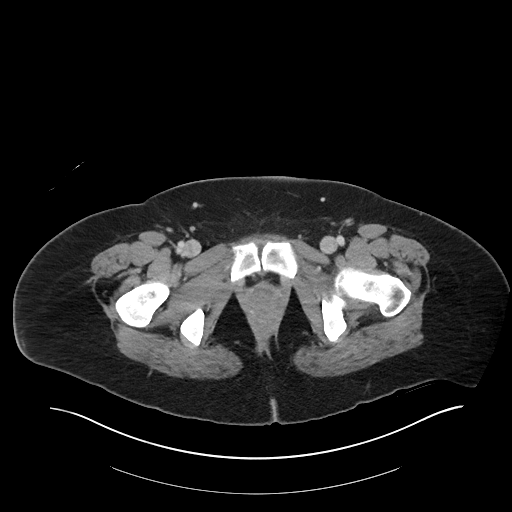
[im 21/98  soft-tissue]
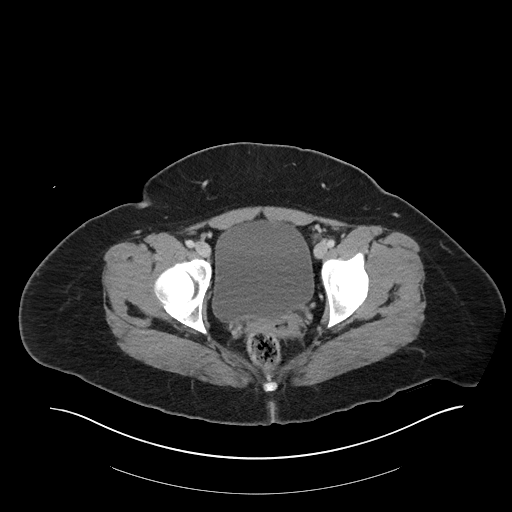
[im 29/98  soft-tissue]
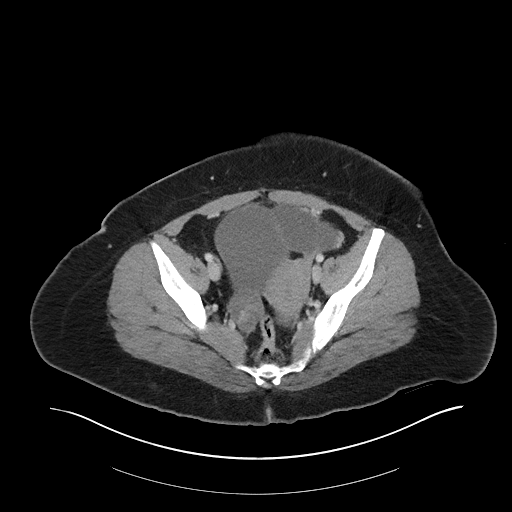
[im 33/98  soft-tissue]
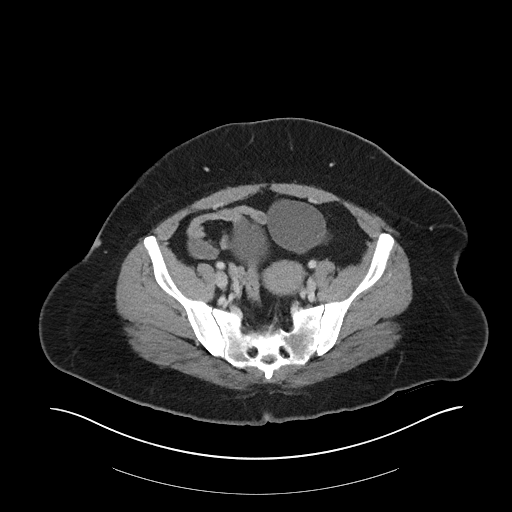
[im 41/98  soft-tissue]
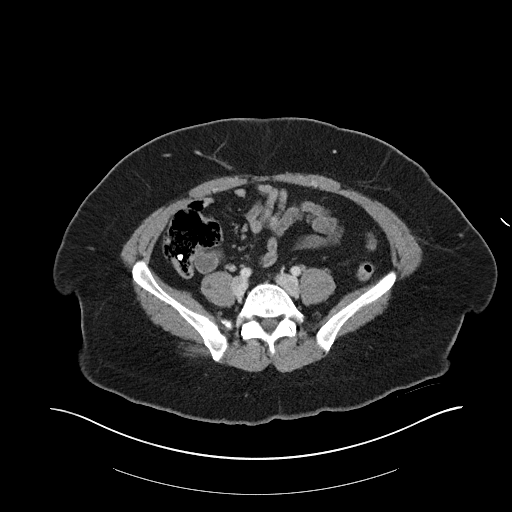
[im 49/98  soft-tissue]
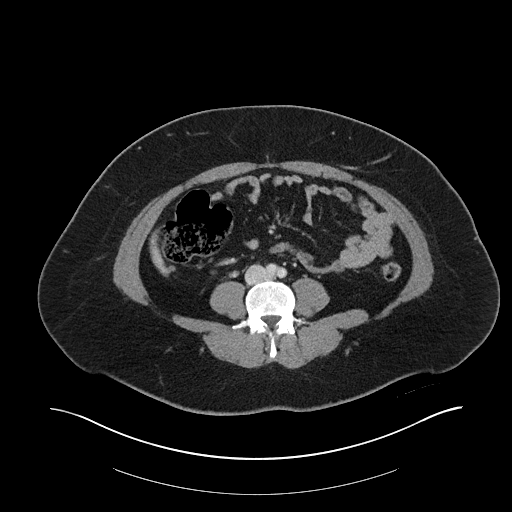
[im 57/98  soft-tissue]
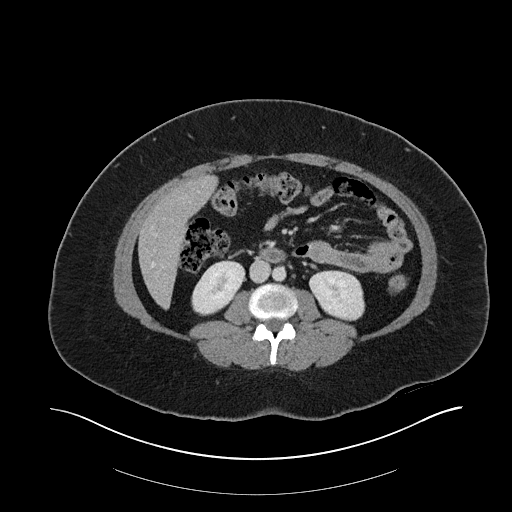
[im 65/98  soft-tissue]
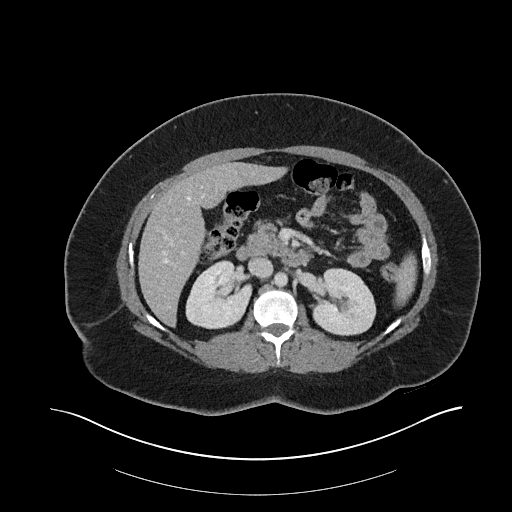
[im 65/98  bone]
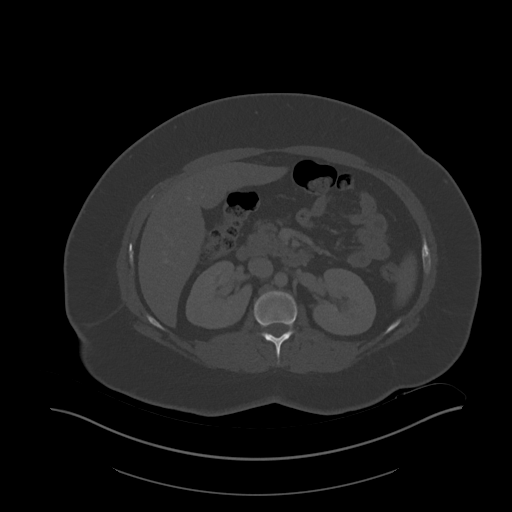
[im 69/98  soft-tissue]
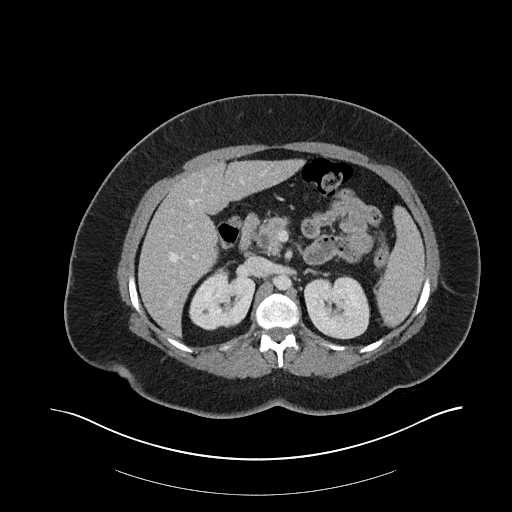
[im 77/98  soft-tissue]
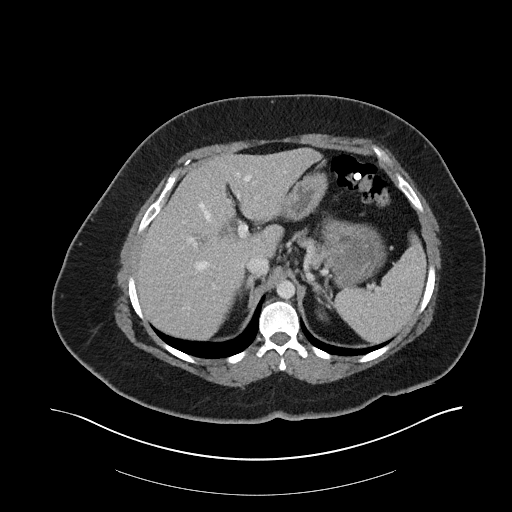
[im 85/98  soft-tissue]
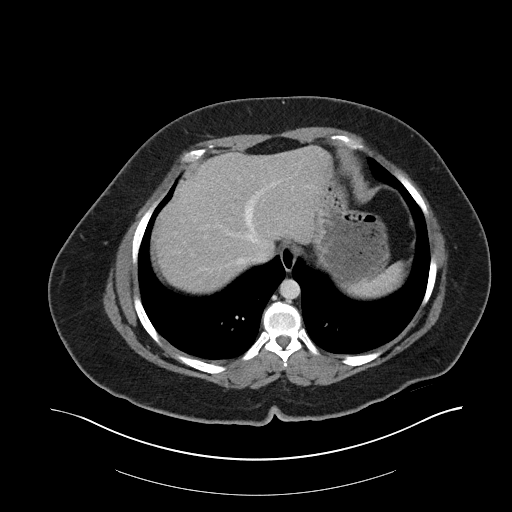
[im 93/98  soft-tissue]
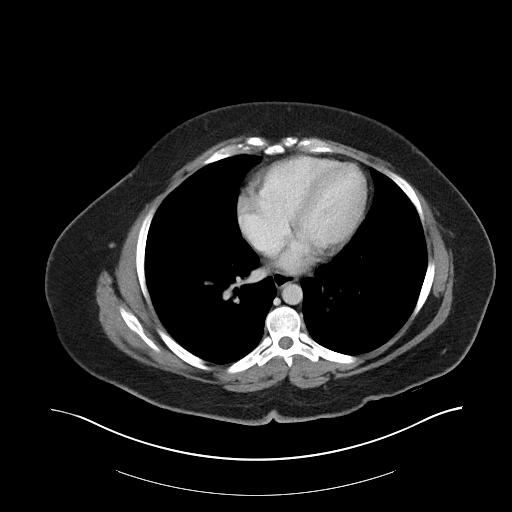

[Series 6: coronal soft tissue · coronal · 0.93mm/px · 3 of 101 slices shown]
[im 34/101  soft-tissue]
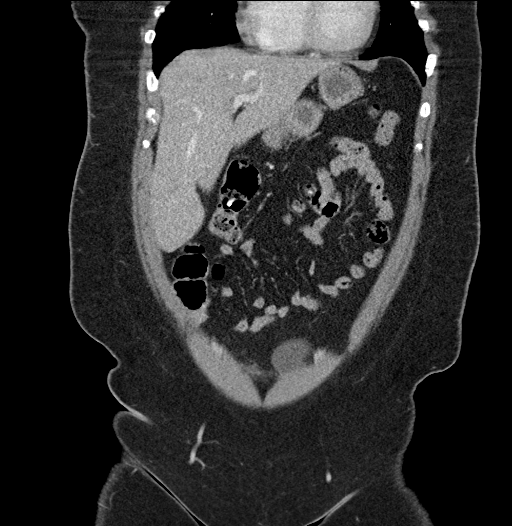
[im 45/101  soft-tissue]
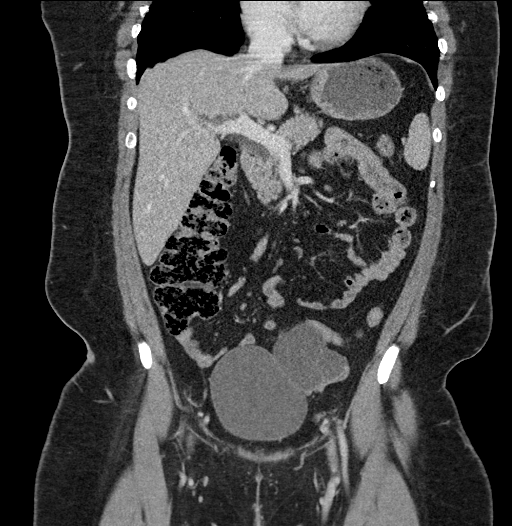
[im 56/101  soft-tissue]
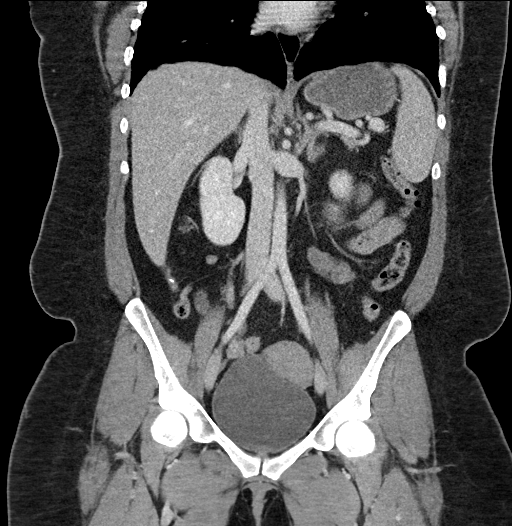

[16 of 46 positions shown; findings below may reference images not displayed]

FINDINGS: Lower chest: No acute abnormality.

Hepatobiliary: No focal liver abnormality. Prior cholecystectomy.
Trace pneumobilia, decreased when compared to prior study. Stable
dilatation of the common bile duct.

Pancreas: Unremarkable. No pancreatic ductal dilatation or
surrounding inflammatory changes.

Spleen: Normal in size without focal abnormality.

Adrenals/Urinary Tract: Adrenal glands are unremarkable. Kidneys are
normal, without renal calculi, focal lesion, or hydronephrosis.
Bladder is unremarkable.

Stomach/Bowel: Stomach is within normal limits. Tiny appendicolith
near the tip of the appendix is unchanged. The appendix is otherwise
unremarkable, without dilatation, wall thickening, or surrounding
inflammatory changes. No evidence of bowel wall thickening,
distention, or inflammatory changes.

Vascular/Lymphatic: No significant vascular findings are present. No
enlarged abdominal or pelvic lymph nodes.

Reproductive: Interval increase in size of a low-density cystic
lesion within the left ovary, containing a thin septation. The
uterus and right ovary are unremarkable.

Other: No free fluid or pneumoperitoneum.

Musculoskeletal: No acute or significant osseous findings.
IMPRESSION: 1.  No acute intra-abdominal process.
2. Slight interval increase in size of a septated left ovarian cyst,
measuring up to 7.8 cm. While this lesion is probably benign, given
its size, non-emergent pelvic ultrasound is recommended for further
characterization. This recommendation follows ACR consensus
guidelines: White Paper of the ACR Incidental Findings Committee II
on Adnexal Findings. [HOSPITAL] [DATE].

## 2018-09-23 IMAGING — RF DG ERCP WO/W SPHINCTEROTOMY
1 series · 6 of 6 positions shown · non-contrast
Comparison: MRI 10/06/2017

CLINICAL DATA: Bile duct stone.

EXAM:
ERCP
TECHNIQUE: Multiple spot images obtained with the fluoroscopic device and
submitted for interpretation post-procedure.
FLUOROSCOPY TIME:  Fluoroscopy Time:  1 minutes and 28 seconds

[Series 1: run · 6 of 6 slices shown]
[im 1/6]
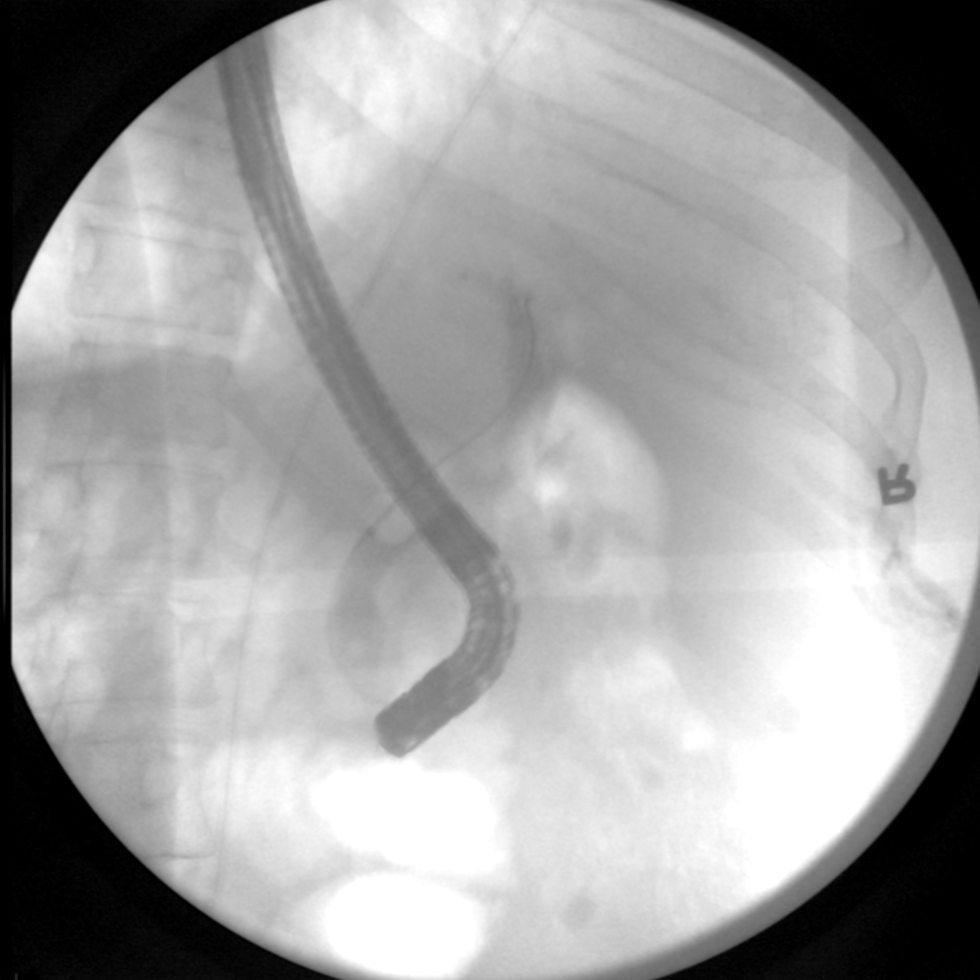
[im 2/6]
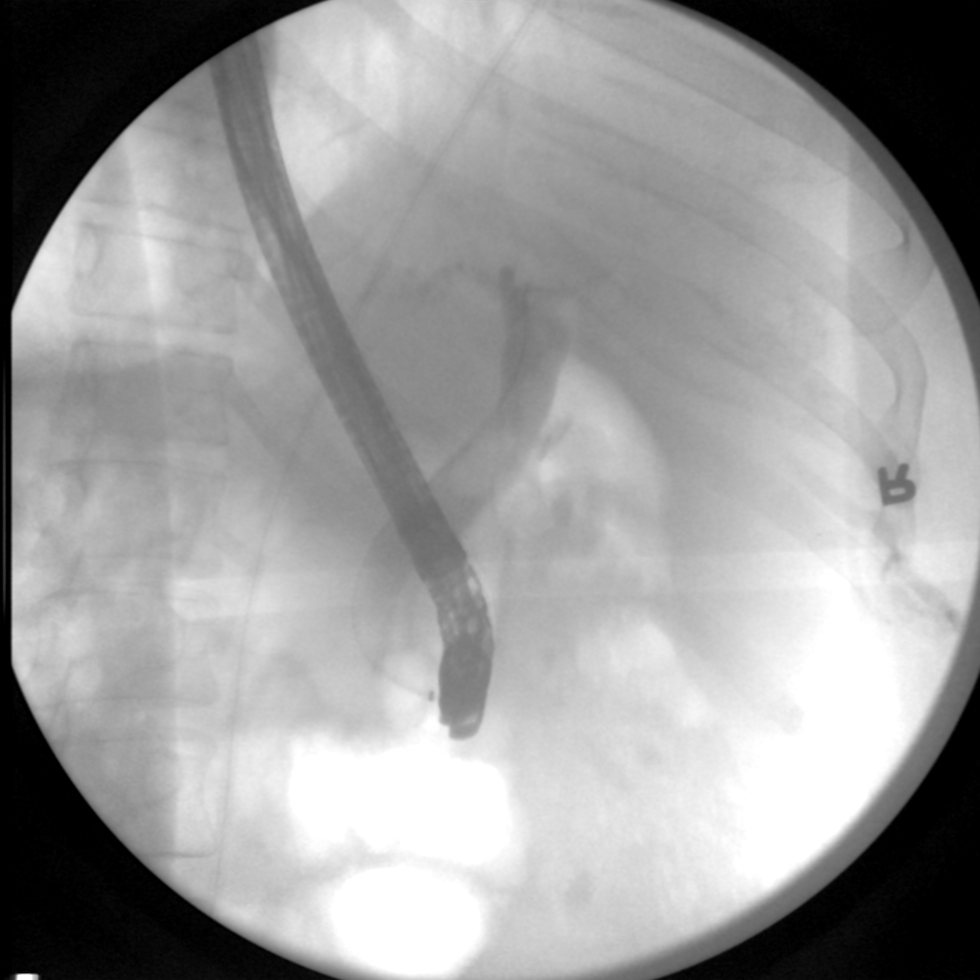
[im 3/6]
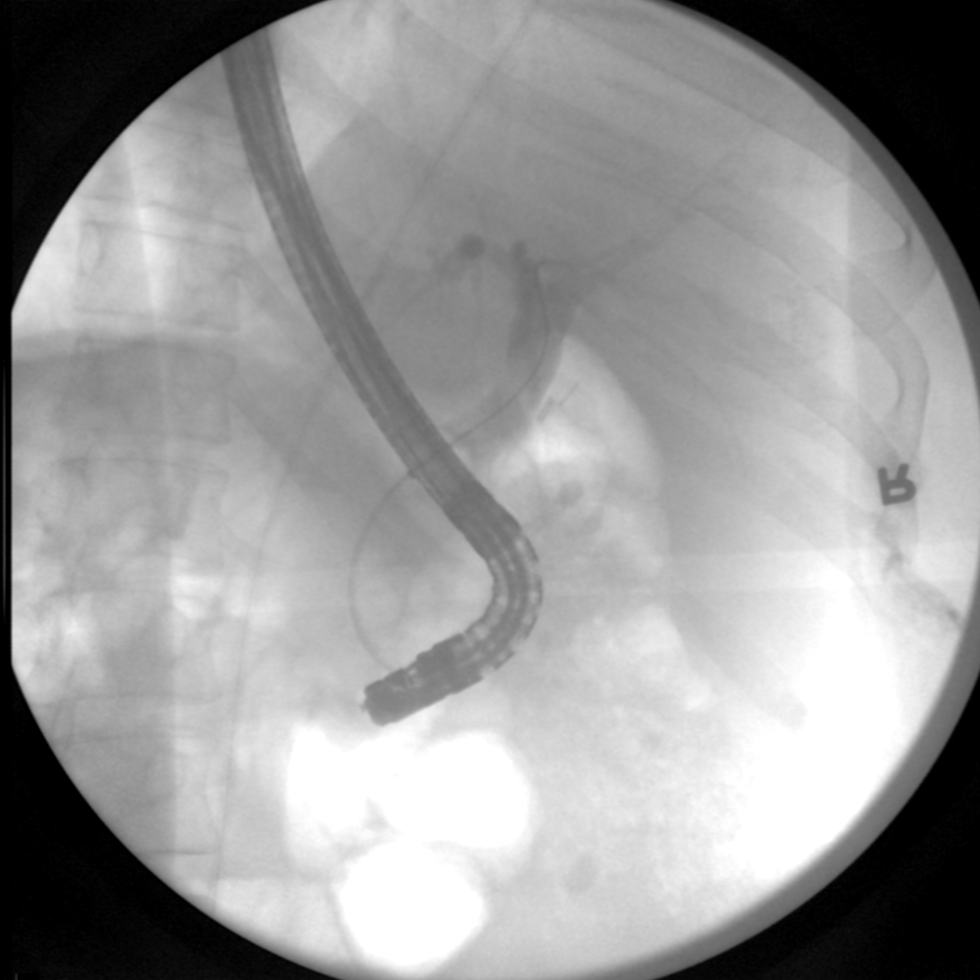
[im 4/6]
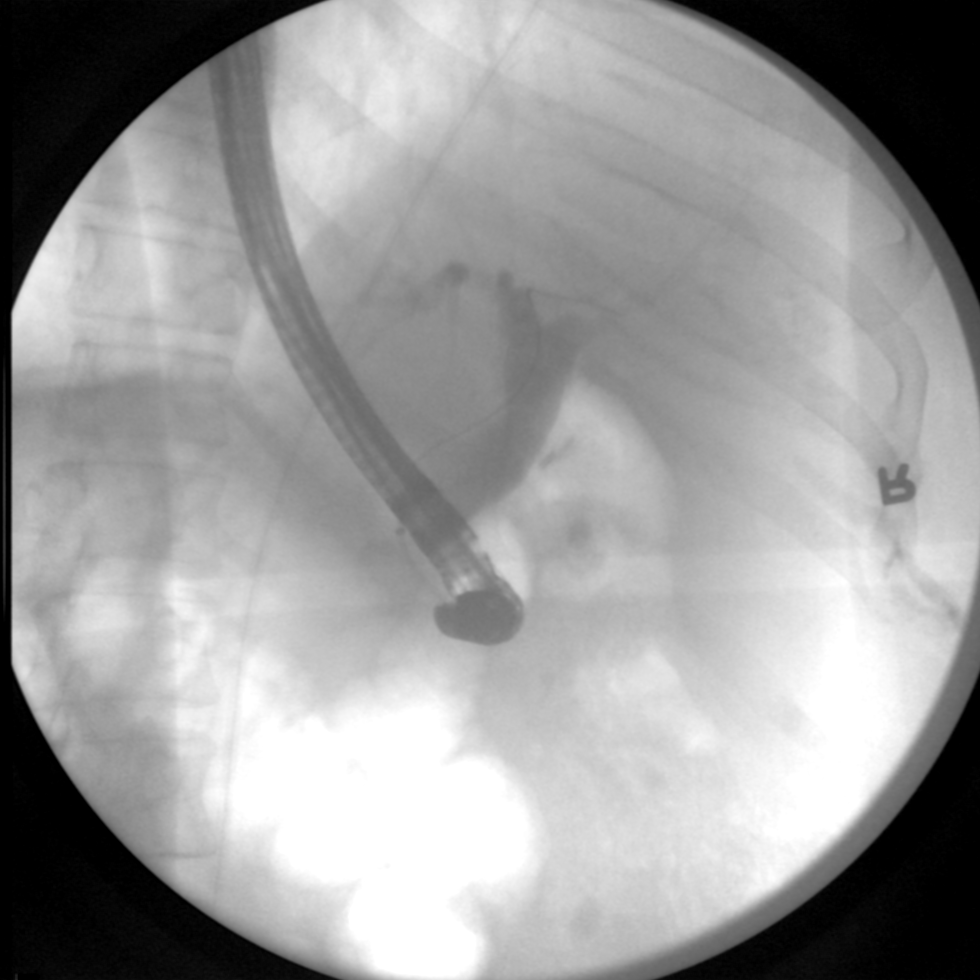
[im 5/6]
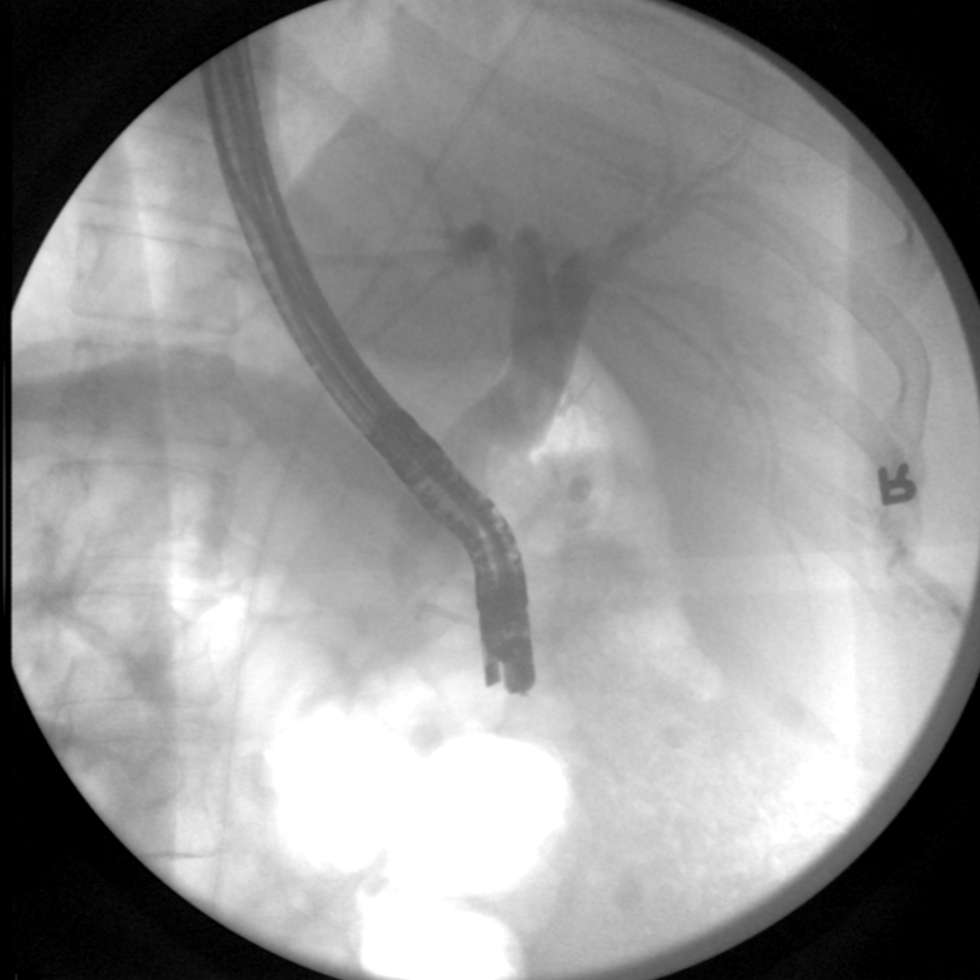
[im 6/6]
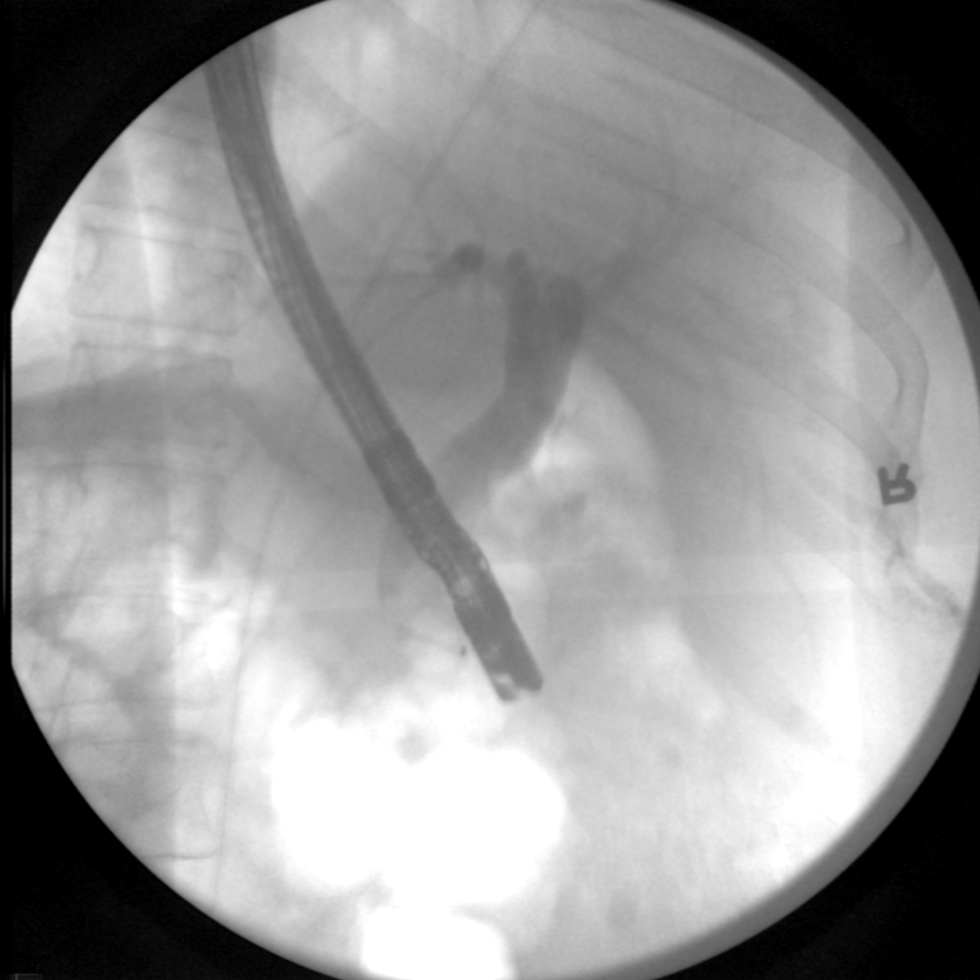

[6 of 6 positions shown; findings below may reference images not displayed]

FINDINGS: Filling defects in the common bile duct are compatible with stones.
Dilatation of the common bile duct. Evidence for balloon sweeps for
stone removal.
IMPRESSION: Choledocholithiasis and stone removal.

These images were submitted for radiologic interpretation only.
Please see the procedural report for the amount of contrast and the
fluoroscopy time utilized.

## 2019-05-13 ENCOUNTER — Other Ambulatory Visit (HOSPITAL_COMMUNITY): Payer: Self-pay | Admitting: Nurse Practitioner

## 2019-05-13 ENCOUNTER — Other Ambulatory Visit: Payer: Self-pay | Admitting: Nurse Practitioner

## 2019-05-13 DIAGNOSIS — R102 Pelvic and perineal pain: Secondary | ICD-10-CM

## 2019-05-20 ENCOUNTER — Other Ambulatory Visit: Payer: Self-pay

## 2019-05-20 ENCOUNTER — Ambulatory Visit (HOSPITAL_COMMUNITY)
Admission: RE | Admit: 2019-05-20 | Discharge: 2019-05-20 | Disposition: A | Discharge status: Home / Self Care | Payer: Self-pay | Source: Ambulatory Visit | Attending: Nurse Practitioner | Admitting: Nurse Practitioner

## 2019-05-20 DIAGNOSIS — R102 Pelvic and perineal pain: Secondary | ICD-10-CM | POA: Insufficient documentation

## 2019-11-06 ENCOUNTER — Ambulatory Visit
Admission: EM | Admit: 2019-11-06 | Discharge: 2019-11-06 | Disposition: A | Discharge status: Home / Self Care | Payer: Self-pay | Attending: Emergency Medicine | Admitting: Emergency Medicine

## 2019-11-06 ENCOUNTER — Other Ambulatory Visit: Payer: Self-pay

## 2019-11-06 DIAGNOSIS — Z833 Family history of diabetes mellitus: Secondary | ICD-10-CM | POA: Insufficient documentation

## 2019-11-06 DIAGNOSIS — Z3202 Encounter for pregnancy test, result negative: Secondary | ICD-10-CM

## 2019-11-06 DIAGNOSIS — Z8249 Family history of ischemic heart disease and other diseases of the circulatory system: Secondary | ICD-10-CM | POA: Insufficient documentation

## 2019-11-06 DIAGNOSIS — R300 Dysuria: Secondary | ICD-10-CM | POA: Insufficient documentation

## 2019-11-06 DIAGNOSIS — Z881 Allergy status to other antibiotic agents status: Secondary | ICD-10-CM | POA: Insufficient documentation

## 2019-11-06 LAB — POCT URINALYSIS DIP (MANUAL ENTRY)
Bilirubin, UA: NEGATIVE
Glucose, UA: NEGATIVE mg/dL
Ketones, POC UA: NEGATIVE mg/dL
Nitrite, UA: POSITIVE — AB
Protein Ur, POC: 100 mg/dL — AB
Spec Grav, UA: 1.025 (ref 1.010–1.025)
Urobilinogen, UA: 4 E.U./dL — AB
pH, UA: 6.5 (ref 5.0–8.0)

## 2019-11-06 LAB — POCT URINE PREGNANCY: Preg Test, Ur: NEGATIVE

## 2019-11-06 MED ORDER — NITROFURANTOIN MONOHYD MACRO 100 MG PO CAPS: 100.0000 mg | ORAL_CAPSULE | Freq: Two times a day (BID) | ORAL | 0 refills | Status: DC

## 2019-11-06 MED ORDER — FLUCONAZOLE 150 MG PO TABS: 150.0000 mg | ORAL_TABLET | ORAL | 0 refills | Status: AC

## 2019-11-06 MED ORDER — FLUCONAZOLE 200 MG PO TABS: 200.0000 mg | ORAL_TABLET | ORAL | 0 refills | Status: DC

## 2019-11-06 MED ORDER — PHENAZOPYRIDINE HCL 100 MG PO TABS: 100.0000 mg | ORAL_TABLET | Freq: Three times a day (TID) | ORAL | 0 refills | Status: DC | PRN

## 2019-11-06 NOTE — Discharge Instructions (Addendum)
Urine is positive for UTI  urine analysis show large amount of red blood cell, some protein, positive for nitrite, and moderate amount of white blood cell Urine culture sent.  We will call you with the results.   Push fluids and get plenty of rest.   Take antibiotic as directed and to completion Take pyridium as prescribed and as needed for symptomatic relief Follow up with PCP if symptoms persists Return here or go to ER if you have any new or worsening symptoms such as fever, worsening abdominal pain, nausea/vomiting, flank pain, etc

## 2019-11-06 NOTE — ED Provider Notes (Addendum)
[facility]   CC: Burning with urination  SUBJECTIVE:  Colleen Arnold is a 34 y.o. female who complains of  dysuria for the past 3 weeks.  Patient denies a precipitating event, recent sexual encounter, excessive caffeine intake.  Localizes the pain to the left lower abdomen.  Pain is intermittent  and describes it as  burning.  Has tried OTC medications without relief.  Symptoms are made worse with urination.  Admits to similar symptoms in the past that was treated with antibiotic.  Denies fever, chills, nausea, vomiting, abdominal pain, flank pain, abnormal vaginal discharge or bleeding, hematuria.    LMP: Patient's last menstrual period was 09/06/2019.  ROS: As in HPI.  All other pertinent ROS negative.     Past Medical History:  Diagnosis Date  . Bile duct stone   . Cholecystitis, acute with cholelithiasis   . Cyst, ovarian   . Elevated LFTs    Past Surgical History:  Procedure Laterality Date  . CESAREAN SECTION    . CHOLECYSTECTOMY    . ERCP N/A 10/06/2017   Procedure: ENDOSCOPIC RETROGRADE CHOLANGIOPANCREATOGRAPHY (ERCP);  Surgeon: Gatha Mayer, MD;  Location: [facility] ENDOSCOPY;  Service: Endoscopy;  Laterality: N/A;  . LAPAROSCOPIC UNILATERAL SALPINGO OOPHERECTOMY Left 11/28/2017   Procedure: LAPAROSCOPIC LEFT SALPINGO OOPHORECTOMY ;  Surgeon: Jonnie Kind, MD;  Location: [facility];  Service: Gynecology;  Laterality: Left;   Allergies  Allergen Reactions  . Rocephin [Ceftriaxone Sodium In Dextrose] Rash   No current facility-administered medications on file prior to encounter.   Current Outpatient Medications on File Prior to Encounter  Medication Sig Dispense Refill  . cholecalciferol (VITAMIN D) 1000 units tablet Take 1,000 Units by mouth 2 (two) times daily.    Marland Kitchen Fexofenadine HCl (ALLEGRA PO) Take 180 mg by mouth daily.     Social History   Socioeconomic History  . Marital status: Single    Spouse name: Not on file  . Number of children: 1  . Years of  education: Not on file  . Highest education level: Not on file  Occupational History  . Occupation: homemaker  Tobacco Use  . Smoking status: Never Smoker  . Smokeless tobacco: Never Used  Substance and Sexual Activity  . Alcohol use: No  . Drug use: No  . Sexual activity: Yes    Birth control/protection: None  Other Topics Concern  . Not on file  Social History Narrative  . Not on file   Social Determinants of Health   Financial Resource Strain:   . Difficulty of Paying Living Expenses: Not on file  Food Insecurity:   . Worried About Charity fundraiser in the Last Year: Not on file  . Ran Out of Food in the Last Year: Not on file  Transportation Needs:   . Lack of Transportation (Medical): Not on file  . Lack of Transportation (Non-Medical): Not on file  Physical Activity:   . Days of Exercise per Week: Not on file  . Minutes of Exercise per Session: Not on file  Stress:   . Feeling of Stress : Not on file  Social Connections:   . Frequency of Communication with Friends and Family: Not on file  . Frequency of Social Gatherings with Friends and Family: Not on file  . Attends Religious Services: Not on file  . Active Member of Clubs or Organizations: Not on file  . Attends Archivist Meetings: Not on file  . Marital Status: Not on file  Intimate  Partner Violence:   . Fear of Current or Ex-Partner: Not on file  . Emotionally Abused: Not on file  . Physically Abused: Not on file  . Sexually Abused: Not on file   Family History  Problem Relation Age of Onset  . Hyperlipidemia Mother   . Diabetes Father   . Hypertension Brother   . Colon cancer Neg Hx     OBJECTIVE:  Vitals:   11/06/19 1553  BP: (!) 141/87  Pulse: 83  Resp: 18  Temp: 98.4 F (36.9 C)  SpO2: 96%   General appearance: AOx3 in no acute distress HEENT: NCAT.  Oropharynx clear.  Lungs: clear to auscultation bilaterally without adventitious breath sounds Heart: regular rate and  rhythm.  Radial pulses 2+ symmetrical bilaterally Abdomen: soft; non-distended; no tenderness; bowel sounds present; no guarding or rebound tenderness Back: no CVA tenderness Extremities: no edema; symmetrical with no gross deformities Skin: warm and dry Neurologic: Ambulates from chair to exam table without difficulty Psychological: alert and cooperative; normal mood and affect  Labs Reviewed  POCT URINALYSIS DIP (MANUAL ENTRY) - Abnormal; Notable for the following components:      Result Value   Color, UA straw (*)    Clarity, UA cloudy (*)    Blood, UA large (*)    Protein Ur, POC =100 (*)    Urobilinogen, UA 4.0 (*)    Nitrite, UA Positive (*)    Leukocytes, UA Moderate (2+) (*)    All other components within normal limits  URINE CULTURE  POCT URINE PREGNANCY    ASSESSMENT & PLAN:  No diagnosis found.  Meds ordered this encounter  Medications  . nitrofurantoin, macrocrystal-monohydrate, (MACROBID) 100 MG capsule    Sig: Take 1 capsule (100 mg total) by mouth 2 (two) times daily.    Dispense:  10 capsule    Refill:  0  . phenazopyridine (PYRIDIUM) 100 MG tablet    Sig: Take 1 tablet (100 mg total) by mouth 3 (three) times daily as needed for pain.    Dispense:  10 tablet    Refill:  0  . DISCONTD: fluconazole (DIFLUCAN) 200 MG tablet    Sig: Take 1 tablet (200 mg total) by mouth every 3 (three) days for 6 days. Take this medication after completing the antibiotic course    Dispense:  2 tablet    Refill:  0  . fluconazole (DIFLUCAN) 150 MG tablet    Sig: Take 1 tablet (150 mg total) by mouth every 3 (three) days for 6 days. Take this medication after completing the antibiotic course    Dispense:  2 tablet    Refill:  0    Urine culture sent.  We will call you with the results.   Push fluids and get plenty of rest.   Take antibiotic as directed and to completion Take pyridium as prescribed and as needed for symptomatic relief Take Diflucan to prevent yeast  infection after completing the antibiotic course Follow up with PCP if symptoms persists Return here or go to ER if you have any new or worsening symptoms such as fever, worsening abdominal pain, nausea/vomiting, flank pain, etc...  Outlined signs and symptoms indicating need for more acute intervention. Patient verbalized understanding. After Visit Summary given.     Emerson Monte, FNP 11/06/19 1639    Emerson Monte, FNP 11/06/19 1640

## 2019-11-06 NOTE — ED Triage Notes (Signed)
Pt presents with c/o dysuria for past 3 weeks and also wants pregnancy test

## 2019-11-09 LAB — URINE CULTURE: Culture: >=100000 — AB

## 2019-11-12 ENCOUNTER — Other Ambulatory Visit (HOSPITAL_COMMUNITY): Payer: Self-pay | Admitting: Nurse Practitioner

## 2019-11-12 DIAGNOSIS — R102 Pelvic and perineal pain: Secondary | ICD-10-CM

## 2019-11-14 ENCOUNTER — Other Ambulatory Visit: Payer: Self-pay

## 2019-11-14 ENCOUNTER — Ambulatory Visit (HOSPITAL_COMMUNITY)
Admission: RE | Admit: 2019-11-14 | Discharge: 2019-11-14 | Disposition: A | Discharge status: Home / Self Care | Payer: Self-pay | Source: Ambulatory Visit | Attending: Nurse Practitioner | Admitting: Nurse Practitioner

## 2019-11-14 DIAGNOSIS — R102 Pelvic and perineal pain: Secondary | ICD-10-CM | POA: Insufficient documentation

## 2019-12-24 ENCOUNTER — Emergency Department (HOSPITAL_COMMUNITY)
Admission: EM | Admit: 2019-12-24 | Discharge: 2019-12-24 | Disposition: A | Discharge status: Home / Self Care | Payer: Self-pay | Attending: Emergency Medicine | Admitting: Emergency Medicine

## 2019-12-24 ENCOUNTER — Other Ambulatory Visit: Payer: Self-pay

## 2019-12-24 ENCOUNTER — Encounter (HOSPITAL_COMMUNITY): Payer: Self-pay

## 2019-12-24 ENCOUNTER — Emergency Department (HOSPITAL_COMMUNITY): Payer: Self-pay

## 2019-12-24 DIAGNOSIS — L03316 Cellulitis of umbilicus: Secondary | ICD-10-CM | POA: Insufficient documentation

## 2019-12-24 DIAGNOSIS — L0390 Cellulitis, unspecified: Secondary | ICD-10-CM

## 2019-12-24 LAB — URINALYSIS, ROUTINE W REFLEX MICROSCOPIC
Bacteria, UA: NONE SEEN
Bilirubin Urine: NEGATIVE
Glucose, UA: NEGATIVE mg/dL
Ketones, ur: NEGATIVE mg/dL
Nitrite: NEGATIVE
Protein, ur: 100 mg/dL — AB
RBC / HPF: >50 RBC/hpf — ABNORMAL HIGH (ref 0–5)
Specific Gravity, Urine: 1.017 (ref 1.005–1.030)
pH: 6 (ref 5.0–8.0)

## 2019-12-24 LAB — COMPREHENSIVE METABOLIC PANEL
ALT: 51 U/L — ABNORMAL HIGH (ref 0–44)
AST: 28 U/L (ref 15–41)
Albumin: 4 g/dL (ref 3.5–5.0)
Alkaline Phosphatase: 43 U/L (ref 38–126)
Anion gap: 9 (ref 5–15)
BUN: 13 mg/dL (ref 6–20)
CO2: 26 mmol/L (ref 22–32)
Calcium: 9.1 mg/dL (ref 8.9–10.3)
Chloride: 106 mmol/L (ref 98–111)
Creatinine, Ser: 0.54 mg/dL (ref 0.44–1.00)
GFR calc Af Amer: >60 mL/min (ref >60)
GFR calc non Af Amer: >60 mL/min (ref >60)
Glucose, Bld: 101 mg/dL — ABNORMAL HIGH (ref 70–99)
Potassium: 3.5 mmol/L (ref 3.5–5.1)
Sodium: 141 mmol/L (ref 135–145)
Total Bilirubin: 0.5 mg/dL (ref 0.3–1.2)
Total Protein: 7.3 g/dL (ref 6.5–8.1)

## 2019-12-24 LAB — I-STAT BETA HCG BLOOD, ED (MC, WL, AP ONLY): I-stat hCG, quantitative: <5 m[IU]/mL (ref <5)

## 2019-12-24 LAB — CBC
HCT: 42.1 % (ref 36.0–46.0)
Hemoglobin: 13.9 g/dL (ref 12.0–15.0)
MCH: 31.2 pg (ref 26.0–34.0)
MCHC: 33 g/dL (ref 30.0–36.0)
MCV: 94.4 fL (ref 80.0–100.0)
Platelets: 236 10*3/uL (ref 150–400)
RBC: 4.46 MIL/uL (ref 3.87–5.11)
RDW: 12.3 % (ref 11.5–15.5)
WBC: 7.2 10*3/uL (ref 4.0–10.5)
nRBC: 0 % (ref 0.0–0.2)

## 2019-12-24 LAB — LIPASE, BLOOD: Lipase: 21 U/L (ref 11–51)

## 2019-12-24 MED ORDER — SULFAMETHOXAZOLE-TRIMETHOPRIM 800-160 MG PO TABS: 1.0000 | ORAL_TABLET | Freq: Two times a day (BID) | ORAL | 0 refills | Status: AC

## 2019-12-24 MED ORDER — IOHEXOL 300 MG/ML  SOLN
100.0000 mL | Freq: Once | INTRAMUSCULAR | Status: AC | PRN
  Administered 2019-12-24: 100 mL via INTRAVENOUS

## 2019-12-24 MED ORDER — SODIUM CHLORIDE 0.9% FLUSH: 3.0000 mL | Freq: Once | INTRAVENOUS | Status: DC

## 2019-12-24 MED ORDER — SODIUM CHLORIDE (PF) 0.9 % IJ SOLN
INTRAMUSCULAR | Status: AC
  Filled 2019-12-24: qty 50

## 2019-12-24 NOTE — ED Provider Notes (Signed)
[facility] Emergency Department Provider Note MRN:  [id number]  Arrival date & time: 12/24/19     Chief Complaint   Abdominal Pain   History of Present Illness   Lania Wendlandt is a 34 y.o. year-old female with a history of cholecystitis presenting to the ED with chief complaint of abdominal pain.  Diffuse abdominal pain worst in the periumbilical region, present for 2 days, present worsening, moderate to severe, associated with drainage from the bellybutton, yellow foul-smelling fluid.  Has never happened before.  Reports multiple abdominal surgeries.  Denies fever, no vomiting, no diarrhea.  Pain is worse with attempting to eat.  Review of Systems  A complete 10 system review of systems was obtained and all systems are negative except as noted in the HPI and PMH.   Patient's Health History    Past Medical History:  Diagnosis Date  . Bile duct stone   . Cholecystitis, acute with cholelithiasis   . Cyst, ovarian   . Elevated LFTs     Past Surgical History:  Procedure Laterality Date  . CESAREAN SECTION    . CHOLECYSTECTOMY    . ERCP N/A 10/06/2017   Procedure: ENDOSCOPIC RETROGRADE CHOLANGIOPANCREATOGRAPHY (ERCP);  Surgeon: Gatha Mayer, MD;  Location: [facility] ENDOSCOPY;  Service: Endoscopy;  Laterality: N/A;  . LAPAROSCOPIC UNILATERAL SALPINGO OOPHERECTOMY Left 11/28/2017   Procedure: LAPAROSCOPIC LEFT SALPINGO OOPHORECTOMY ;  Surgeon: Jonnie Kind, MD;  Location: [facility];  Service: Gynecology;  Laterality: Left;    Family History  Problem Relation Age of Onset  . Hyperlipidemia Mother   . Diabetes Father   . Hypertension Brother   . Colon cancer Neg Hx     Social History   Socioeconomic History  . Marital status: Single    Spouse name: Not on file  . Number of children: 1  . Years of education: Not on file  . Highest education level: Not on file  Occupational History  . Occupation: homemaker  Tobacco Use  . Smoking status: Never Smoker    . Smokeless tobacco: Never Used  Substance and Sexual Activity  . Alcohol use: No  . Drug use: No  . Sexual activity: Yes    Birth control/protection: None  Other Topics Concern  . Not on file  Social History Narrative  . Not on file   Social Determinants of Health   Financial Resource Strain:   . Difficulty of Paying Living Expenses: Not on file  Food Insecurity:   . Worried About Charity fundraiser in the Last Year: Not on file  . Ran Out of Food in the Last Year: Not on file  Transportation Needs:   . Lack of Transportation (Medical): Not on file  . Lack of Transportation (Non-Medical): Not on file  Physical Activity:   . Days of Exercise per Week: Not on file  . Minutes of Exercise per Session: Not on file  Stress:   . Feeling of Stress : Not on file  Social Connections:   . Frequency of Communication with Friends and Family: Not on file  . Frequency of Social Gatherings with Friends and Family: Not on file  . Attends Religious Services: Not on file  . Active Member of Clubs or Organizations: Not on file  . Attends Archivist Meetings: Not on file  . Marital Status: Not on file  Intimate Partner Violence:   . Fear of Current or Ex-Partner: Not on file  . Emotionally Abused: Not on file  .  Physically Abused: Not on file  . Sexually Abused: Not on file     Physical Exam   Vitals:   12/24/19 2130 12/24/19 2230  BP: 115/62 (!) 100/58  Pulse: 73 65  Resp: 18 18  Temp:    SpO2: 98% 97%    CONSTITUTIONAL: Well-appearing, NAD NEURO:  Alert and oriented x 3, no focal deficits EYES:  eyes equal and reactive ENT/NECK:  no LAD, no JVD CARDIO: Regular rate, well-perfused, normal S1 and S2 PULM:  CTAB no wheezing or rhonchi GI/GU:  normal bowel sounds, non-distended, diffuse tenderness to palpation, no appreciable fistula or discharge though exam limited due to patient discomfort MSK/SPINE:  No gross deformities, no edema SKIN:  no rash, atraumatic PSYCH:   Appropriate speech and behavior  *Additional and/or pertinent findings included in MDM below  Diagnostic and Interventional Summary    EKG Interpretation  Date/Time:    Ventricular Rate:    PR Interval:    QRS Duration:   QT Interval:    QTC Calculation:   R Axis:     Text Interpretation:        Labs Reviewed  COMPREHENSIVE METABOLIC PANEL - Abnormal; Notable for the following components:      Result Value   Glucose, Bld 101 (*)    ALT 51 (*)    All other components within normal limits  URINALYSIS, ROUTINE W REFLEX MICROSCOPIC - Abnormal; Notable for the following components:   Color, Urine RED (*)    APPearance HAZY (*)    Hgb urine dipstick LARGE (*)    Protein, ur 100 (*)    Leukocytes,Ua TRACE (*)    RBC / HPF >50 (*)    All other components within normal limits  LIPASE, BLOOD  CBC  I-STAT BETA HCG BLOOD, ED ([facility], [facility], [facility] ONLY)    CT ABDOMEN PELVIS W CONTRAST  Final Result      Medications  sodium chloride flush (NS) 0.9 % injection 3 mL (0 mLs Intravenous Hold 12/24/19 1937)  iohexol (OMNIPAQUE) 300 MG/ML solution 100 mL (100 mLs Intravenous Contrast Given 12/24/19 2200)     Procedures  /  Critical Care Procedures  ED Course and Medical Decision Making  I have reviewed the triage vital signs, the nursing notes, and pertinent available records from the EMR.  Pertinent labs & imaging results that were available during my care of the patient were reviewed by me and considered in my medical decision making (see below for details).     Question of enterocutaneous fistula versus obstructive process, will need CT to further evaluate.  10:45 AM update: Work-up is largely unrevealing, no leukocytosis, normal labs, CT without signs of fistula, largely without acute process.  Fat-containing hernia may explain some of her pain but not the discharge.  Will treat for cellulitis of the umbilicus as well.  And appropriate for discharge.  Barth Kirks. [place] Small, [place] [email or web address]  Final Clinical Impressions(s) / ED Diagnoses     ICD-10-CM   1. Cellulitis, unspecified cellulitis site  L03.90     ED Discharge Orders         Ordered    sulfamethoxazole-trimethoprim (BACTRIM DS) 800-160 MG tablet  2 times daily     12/24/19 2244           Discharge Instructions Discussed with and Provided to Patient:     Discharge Instructions     You were evaluated in the Emergency Department and  after careful evaluation, we did not find any emergent condition requiring admission or further testing in the hospital.  Your exam/testing today was overall reassuring.  Your CT scan showed a very small hernia near the bellybutton.  This may explain some of your pain.  You may also have infection of the skin within the bellybutton.  Please take the antibiotic as directed.  We recommend Tylenol or Motrin at home for discomfort.  If you have continued pain after 1 week, we recommend follow-up with the general surgeons.  Please return to the Emergency Department if you experience any worsening of your condition.  We encourage you to follow up with a primary care provider.  Thank you for allowing [place] to be a part of your care.        Maudie Flakes, MD 12/24/19 2246

## 2019-12-24 NOTE — Discharge Instructions (Addendum)
You were evaluated in the Emergency Department and after careful evaluation, we did not find any emergent condition requiring admission or further testing in the hospital.  Your exam/testing today was overall reassuring.  Your CT scan showed a very small hernia near the bellybutton.  This may explain some of your pain.  You may also have infection of the skin within the bellybutton.  Please take the antibiotic as directed.  We recommend Tylenol or Motrin at home for discomfort.  If you have continued pain after 1 week, we recommend follow-up with the general surgeons.  Please return to the Emergency Department if you experience any worsening of your condition.  We encourage you to follow up with a primary care provider.  Thank you for allowing [place] to be a part of your care.

## 2019-12-24 NOTE — ED Triage Notes (Signed)
Patient c/o mid abdominal pain x 2 days. Patient states she had a foul white/yellow drainage from her umbilicus. Patient states pain is worse after eating.

## 2019-12-24 NOTE — ED Notes (Signed)
Requested urine from patient. 

## 2020-01-20 ENCOUNTER — Ambulatory Visit: Payer: Self-pay | Attending: Internal Medicine

## 2020-01-20 DIAGNOSIS — Z23 Encounter for immunization: Secondary | ICD-10-CM

## 2020-01-20 NOTE — Progress Notes (Signed)
   Covid-19 Vaccination Clinic  Name:  Sukanya Enz    MRN: [id number] DOB: [birth year]  01/20/2020  Ms. Norton was observed post Covid-19 immunization for 15 minutes without incident. She was provided with Vaccine Information Sheet and instruction to access the V-Safe system.   Ms. Eaken was instructed to call 911 with any severe reactions post vaccine: Marland Kitchen Difficulty breathing  . Swelling of face and throat  . A fast heartbeat  . A bad rash all over body  . Dizziness and weakness   Immunizations Administered    Name Date Dose VIS Date Route   Pfizer COVID-19 Vaccine 01/20/2020  2:43 PM 0.3 mL 09/27/2019 Intramuscular   Manufacturer: [place]   Lot: [id number]   [place]: [id number]

## 2020-02-10 ENCOUNTER — Ambulatory Visit: Payer: Self-pay | Attending: Internal Medicine

## 2020-02-10 DIAGNOSIS — Z23 Encounter for immunization: Secondary | ICD-10-CM

## 2020-02-10 NOTE — Progress Notes (Signed)
   Covid-19 Vaccination Clinic  Name:  Colleen Arnold    MRN: [id number] DOB: 1986/10/04  02/10/2020  [name] was observed post Covid-19 immunization for 15 minutes without incident. She was provided with Vaccine Information Sheet and instruction to access the V-Safe system.   [name] was instructed to call 911 with any severe reactions post vaccine: Marland Kitchen Difficulty breathing  . Swelling of face and throat  . A fast heartbeat  . A bad rash all over body  . Dizziness and weakness   Immunizations Administered    Name Date Dose VIS Date Route   Pfizer COVID-19 Vaccine 02/10/2020  2:49 PM 0.3 mL 12/11/2018 Intramuscular   Manufacturer: [place]   Lot: [id number]   [place]: [id number]

## 2020-02-14 ENCOUNTER — Ambulatory Visit: Payer: Self-pay

## 2020-03-27 ENCOUNTER — Emergency Department (HOSPITAL_COMMUNITY)
Admission: EM | Admit: 2020-03-27 | Discharge: 2020-03-27 | Disposition: A | Discharge status: Home / Self Care | Payer: Self-pay | Attending: Emergency Medicine | Admitting: Emergency Medicine

## 2020-03-27 ENCOUNTER — Other Ambulatory Visit: Payer: Self-pay

## 2020-03-27 ENCOUNTER — Emergency Department (HOSPITAL_COMMUNITY): Payer: Self-pay

## 2020-03-27 ENCOUNTER — Encounter (HOSPITAL_COMMUNITY): Payer: Self-pay

## 2020-03-27 DIAGNOSIS — Z3A09 9 weeks gestation of pregnancy: Secondary | ICD-10-CM

## 2020-03-27 DIAGNOSIS — R102 Pelvic and perineal pain: Secondary | ICD-10-CM

## 2020-03-27 DIAGNOSIS — O26831 Pregnancy related renal disease, first trimester: Secondary | ICD-10-CM | POA: Insufficient documentation

## 2020-03-27 DIAGNOSIS — R319 Hematuria, unspecified: Secondary | ICD-10-CM

## 2020-03-27 LAB — COMPREHENSIVE METABOLIC PANEL
ALT: 24 U/L (ref 0–44)
AST: 18 U/L (ref 15–41)
Albumin: 3.7 g/dL (ref 3.5–5.0)
Alkaline Phosphatase: 43 U/L (ref 38–126)
Anion gap: 9 (ref 5–15)
BUN: 12 mg/dL (ref 6–20)
CO2: 23 mmol/L (ref 22–32)
Calcium: 9.1 mg/dL (ref 8.9–10.3)
Chloride: 104 mmol/L (ref 98–111)
Creatinine, Ser: 0.46 mg/dL (ref 0.44–1.00)
GFR calc Af Amer: >60 mL/min (ref >60)
GFR calc non Af Amer: >60 mL/min (ref >60)
Glucose, Bld: 98 mg/dL (ref 70–99)
Potassium: 4.1 mmol/L (ref 3.5–5.1)
Sodium: 136 mmol/L (ref 135–145)
Total Bilirubin: 0.4 mg/dL (ref 0.3–1.2)
Total Protein: 7.4 g/dL (ref 6.5–8.1)

## 2020-03-27 LAB — URINALYSIS, ROUTINE W REFLEX MICROSCOPIC
Bilirubin Urine: NEGATIVE
Glucose, UA: NEGATIVE mg/dL
Ketones, ur: NEGATIVE mg/dL
Leukocytes,Ua: NEGATIVE
Nitrite: NEGATIVE
Protein, ur: NEGATIVE mg/dL
Specific Gravity, Urine: 1.01 (ref 1.005–1.030)
pH: 6 (ref 5.0–8.0)

## 2020-03-27 LAB — CBC
HCT: 38.5 % (ref 36.0–46.0)
Hemoglobin: 13.2 g/dL (ref 12.0–15.0)
MCH: 31.9 pg (ref 26.0–34.0)
MCHC: 34.3 g/dL (ref 30.0–36.0)
MCV: 93 fL (ref 80.0–100.0)
Platelets: 260 10*3/uL (ref 150–400)
RBC: 4.14 MIL/uL (ref 3.87–5.11)
RDW: 12.4 % (ref 11.5–15.5)
WBC: 9.1 10*3/uL (ref 4.0–10.5)
nRBC: 0 % (ref 0.0–0.2)

## 2020-03-27 LAB — I-STAT BETA HCG BLOOD, ED (MC, WL, AP ONLY): I-stat hCG, quantitative: >2000 m[IU]/mL — ABNORMAL HIGH (ref <5)

## 2020-03-27 LAB — LIPASE, BLOOD: Lipase: 25 U/L (ref 11–51)

## 2020-03-27 MED ORDER — SODIUM CHLORIDE 0.9% FLUSH: 3.0000 mL | Freq: Once | INTRAVENOUS | Status: DC

## 2020-03-27 MED ORDER — ACETAMINOPHEN 325 MG PO TABS: 650.0000 mg | ORAL_TABLET | Freq: Four times a day (QID) | ORAL | 0 refills | Status: DC | PRN

## 2020-03-27 MED ORDER — ACETAMINOPHEN 325 MG PO TABS
650.0000 mg | ORAL_TABLET | Freq: Once | ORAL | Status: AC
  Administered 2020-03-27: 650 mg via ORAL
  Filled 2020-03-27: qty 2

## 2020-03-27 NOTE — ED Notes (Signed)
U/s at bedside

## 2020-03-27 NOTE — ED Triage Notes (Signed)
Pt reports that she is [redacted] weeks pregnant. She started experiencing pelvic and abdominal pain and cramping around noon today. Denies vaginal bleeding. Reports that she only has one ovary. Denies vomiting. Has prenatal care set up on 6/21. No previous hx of ectopic pregnancy.

## 2020-03-27 NOTE — Discharge Instructions (Addendum)
You are having pain in your lower abdomen today.  This may be signs that your body is preparing for a miscarriage, but it may also be a natural condition called round ligament pain.  I included information for you to read over.  Your ultrasound showed a living fetus inside your uterus dated at 9 weeks 5 days.  You should follow-up with your OB/GYN provider as scheduled this month.  You can continue taking Tylenol at home for pain, or consider heating packs to the belly and back.  Your urine test also showed blood today.  This may have been mixed blood from the vagina, but may also be a sign of something like a kidney stone.  Most kidney stones pass on their own.  I advised you to continue drinking plenty of water.

## 2020-03-27 NOTE — ED Provider Notes (Signed)
[facility] DEPT Provider Note   CSN: [id number] Arrival date & time: 03/27/20  1948     History Chief Complaint  Patient presents with  . Abdominal Pain    9 weeks    Colleen Arnold is a 34 y.o. female G2P1 w/ hx of left ovary removal (due to cyst), currently at approx [redacted] weeks gestation by LMP, presented to emergency department with abdominal pelvic pain and cramping pain.  She reports this began around noon today.  Says she has had intermittent episodes of cramping in her lower belly lasting a few minutes at a time and then going away.  She also feels cramping in her lower back.  She denies any vaginal bleeding or leakage of fluid.  She has not taken anything at home for this.  She had one prior pregnancy to term which is approximately 11 years ago with her daughter.  She has no history of miscarriages.  Her first OB/GYN appointment is scheduled in approximately 10 days in [place].  No hx of kidney stones.  No dysuria or hematuria.  Allergies to rocephine No other medical problems  HPI     Past Medical History:  Diagnosis Date  . Bile duct stone   . Cholecystitis, acute with cholelithiasis   . Cyst, ovarian   . Elevated LFTs     Patient Active Problem List   Diagnosis Date Noted  . Epigastric pain   . Intractable abdominal pain 10/06/2017  . LFT elevation 10/06/2017  . Ovarian mass, left 10/06/2017  . Biliary obstruction 10/06/2017  . Elevated LFTs   . Choledocholithiasis with obstruction     Past Surgical History:  Procedure Laterality Date  . CESAREAN SECTION    . CHOLECYSTECTOMY    . ERCP N/A 10/06/2017   Procedure: ENDOSCOPIC RETROGRADE CHOLANGIOPANCREATOGRAPHY (ERCP);  Surgeon: Gatha Mayer, MD;  Location: [facility] ENDOSCOPY;  Service: Endoscopy;  Laterality: N/A;  . LAPAROSCOPIC UNILATERAL SALPINGO OOPHERECTOMY Left 11/28/2017   Procedure: LAPAROSCOPIC LEFT SALPINGO OOPHORECTOMY ;  Surgeon: Jonnie Kind, MD;  Location: [facility]  ORS;  Service: Gynecology;  Laterality: Left;     OB History    Gravida  1   Para  1   Term  1   Preterm      AB      Living  1     SAB      TAB      Ectopic      Multiple      Live Births  1           Family History  Problem Relation Age of Onset  . Hyperlipidemia Mother   . Diabetes Father   . Hypertension Brother   . Colon cancer Neg Hx     Social History   Tobacco Use  . Smoking status: Never Smoker  . Smokeless tobacco: Never Used  Vaping Use  . Vaping Use: Never used  Substance Use Topics  . Alcohol use: No  . Drug use: No    Home Medications Prior to Admission medications   Medication Sig Start Date End Date Taking? Authorizing Provider  prenatal vitamin w/FE, FA (PRENATAL 1 + 1) 27-1 MG TABS tablet Take 1 tablet by mouth daily at 12 noon.   Yes [provider]  acetaminophen (TYLENOL) 325 MG tablet Take 2 tablets (650 mg total) by mouth every 6 (six) hours as needed for up to 30 doses for mild pain or fever. 03/27/20   Wyvonnia Dusky,  MD  levonorgestrel-ethinyl estradiol (VIENVA) 0.1-20 MG-MCG tablet Take 1 tablet by mouth daily. Patient not taking: Reported on 03/27/2020    [provider]  nitrofurantoin, macrocrystal-monohydrate, (MACROBID) 100 MG capsule Take 1 capsule (100 mg total) by mouth 2 (two) times daily. Patient not taking: Reported on 12/24/2019 11/06/19   Emerson Monte, FNP  phenazopyridine (PYRIDIUM) 100 MG tablet Take 1 tablet (100 mg total) by mouth 3 (three) times daily as needed for pain. Patient not taking: Reported on 12/24/2019 11/06/19   Emerson Monte, FNP    Allergies    Rocephin [ceftriaxone sodium in dextrose]  Review of Systems   Review of Systems  Constitutional: Negative for chills and fever.  Respiratory: Negative for cough and shortness of breath.   Cardiovascular: Negative for chest pain and palpitations.  Gastrointestinal: Negative for abdominal pain, nausea and vomiting.    Genitourinary: Positive for pelvic pain. Negative for dysuria, hematuria, vaginal bleeding, vaginal discharge and vaginal pain.  Musculoskeletal: Positive for back pain. Negative for arthralgias.  Skin: Negative for pallor and rash.  Allergic/Immunologic: Negative for food allergies and immunocompromised state.  Neurological: Negative for syncope and headaches.  Psychiatric/Behavioral: Negative for agitation and confusion.  All other systems reviewed and are negative.   Physical Exam Updated Vital Signs BP 127/71   Pulse 74   Temp 98.7 F (37.1 C)   Resp 16   Ht 5\' 3"  (1.6 m)   Wt 111.9 kg   SpO2 100%   BMI 43.72 kg/m   Physical Exam Vitals and nursing note reviewed.  Constitutional:      General: She is not in acute distress.    Appearance: She is well-developed. She is obese.  HENT:     Head: Normocephalic and atraumatic.  Eyes:     Conjunctiva/sclera: Conjunctivae normal.  Cardiovascular:     Rate and Rhythm: Normal rate and regular rhythm.     Heart sounds: Normal heart sounds.  Pulmonary:     Effort: Pulmonary effort is normal. No respiratory distress.  Abdominal:     Palpations: Abdomen is soft.     Tenderness: There is no abdominal tenderness.  Musculoskeletal:     Cervical back: Neck supple.  Skin:    General: Skin is warm and dry.  Neurological:     General: No focal deficit present.     Mental Status: She is alert and oriented to person, place, and time.  Psychiatric:        Mood and Affect: Mood normal.        Behavior: Behavior normal.     ED Results / Procedures / Treatments   Labs (all labs ordered are listed, but only abnormal results are displayed) Labs Reviewed  URINALYSIS, ROUTINE W REFLEX MICROSCOPIC - Abnormal; Notable for the following components:      Result Value   Hgb urine dipstick LARGE (*)    Bacteria, UA RARE (*)    All other components within normal limits  I-STAT BETA HCG BLOOD, ED ([facility], [facility], [facility] ONLY) - Abnormal; Notable for  the following components:   I-stat hCG, quantitative >2,000.0 (*)    All other components within normal limits  LIPASE, BLOOD  COMPREHENSIVE METABOLIC PANEL  CBC    EKG None  Radiology US OB LESS THAN 14 WEEKS WITH OB TRANSVAGINAL  Result Date: 03/27/2020 CLINICAL DATA:  Pregnant patient in first-trimester pregnancy with pelvic pain. EXAM: OBSTETRIC <14 WK [place] AND TRANSVAGINAL OB US TECHNIQUE: Both transabdominal and transvaginal ultrasound examinations were  performed for complete evaluation of the gestation as well as the maternal uterus, adnexal regions, and pelvic cul-de-sac. Transvaginal technique was performed to assess early pregnancy. COMPARISON:  None. FINDINGS: Intrauterine gestational sac: Single Yolk sac:  Visualized. Embryo:  Visualized. Cardiac Activity: Visualized. Heart Rate: 167 bpm CRL:  28.3 mm   9 w   5 d                  [place] EDC: 10/25/2020 Subchorionic hemorrhage:  Trace. Maternal uterus/adnexae: The uterus is retroverted. Single live intrauterine gestation positive fetal heart tones. Small subchorionic hemorrhage. The right ovary is visualized and is normal. The left ovary is not seen, history of left oophorectomy. No adnexal mass. No pelvic free fluid. IMPRESSION: 1. Single live intrauterine pregnancy estimated gestational age [redacted] weeks 5 days based on crown-rump length for ultrasound [facility] 10/25/2020. 2. Small subchorionic hemorrhage. Electronically Signed   By: Keith Rake M.D.   On: 03/27/2020 23:21    Procedures Procedures (including critical care time)  Medications Ordered in ED Medications  sodium chloride flush (NS) 0.9 % injection 3 mL (has no administration in time range)  acetaminophen (TYLENOL) tablet 650 mg (650 mg Oral Given 03/27/20 2212)    ED Course  I have reviewed the triage vital signs and the nursing notes.  Pertinent labs & imaging results that were available during my care of the patient were reviewed by me and considered in my medical decision  making (see chart for details).  34 year old female present at [redacted] weeks gestation with cramping lower abdominal, pelvic and back pain.  Upreg positive.  Hasn't had ultrasound yet, with first OB appointment in 2 weeks.  Differential includes threatened abortion versus round ligament pain versus cystitis versus other.  Labs were personally reviewed by myself including unremarkable BMP and lipase and CBC.  UA without significant signs of infection - large hgb which may suggest a possible kidney stone.    Pregnancy ultrasound obtained and per my personal review showed a viable intrauterine pregnancy with a fetal heart rate of 166, delayed approximately 9 weeks 5 days by crown-rump length.  Do not see clear evidence of ectopic but awaiting radiology report.  Discussed workup with patient and her boyfriend.  I would advise to continue Tylenol attempt heating packs to the abdomen as well.  Advised to follow-up with her OB provider as scheduled.  She verbalized understanding.  Clinical Course as of Mar 27 2326  Fri Mar 27, 2020  2327  IMPRESSION: 1. Single live intrauterine pregnancy estimated gestational age [redacted] weeks 5 days based on crown-rump length for ultrasound [facility] 10/25/2020. 2. Small subchorionic hemorrhage.   [MT]    Clinical Course User Index [MT] Taniesha Glanz, Carola Rhine, MD   Final Clinical Impression(s) / ED Diagnoses Final diagnoses:  Pelvic pain  Hematuria, unspecified type  [redacted] weeks gestation of pregnancy    Rx / DC Orders ED Discharge Orders         Ordered    acetaminophen (TYLENOL) 325 MG tablet  Every 6 hours PRN     Discontinue  Reprint     03/27/20 2327           Wyvonnia Dusky, MD 03/27/20 2328

## 2020-03-30 ENCOUNTER — Telehealth: Payer: Self-pay | Admitting: *Deleted

## 2020-03-30 ENCOUNTER — Telehealth: Payer: Self-pay | Admitting: Women's Health

## 2020-03-30 NOTE — Telephone Encounter (Signed)
Patient called stating that she went to the ER over the weekend because she was having pain, they did an [place] and they told her the baby looks good and it had a strong heart beat, pt states that this morning when she woke up she had pressure and pain and she had a brown liquid that is coming out of her vaginal area. Pt states that she goes to the restroom frequently as if she has to push and it is more bown liquid. Pt is pregnant. Please advice, pt speaks spanish.

## 2020-03-30 NOTE — Telephone Encounter (Signed)
Patient states she went to the ER for bleeding and is still having brown discharge.  Informed patient she may continue to have brown discharge until the hemorrhage resolves.  Advised to refrain from sex and to increase fluids.  Will cancel dating u/s since she had one completed at the hospital and will change to new ob. Pt verbalized understanding and agreeable to plan.

## 2020-04-06 ENCOUNTER — Other Ambulatory Visit: Payer: Self-pay

## 2020-04-16 ENCOUNTER — Encounter: Payer: Self-pay | Admitting: *Deleted

## 2020-04-16 DIAGNOSIS — O0990 Supervision of high risk pregnancy, unspecified, unspecified trimester: Secondary | ICD-10-CM | POA: Insufficient documentation

## 2020-04-27 ENCOUNTER — Other Ambulatory Visit: Payer: Self-pay

## 2020-04-27 ENCOUNTER — Encounter: Payer: Self-pay | Admitting: Women's Health

## 2020-04-27 ENCOUNTER — Ambulatory Visit (INDEPENDENT_AMBULATORY_CARE_PROVIDER_SITE_OTHER): Payer: Self-pay | Admitting: Women's Health

## 2020-04-27 VITALS — BP 114/65 | HR 77 | Ht 62.5 in | Wt 249.0 lb

## 2020-04-27 DIAGNOSIS — Z3480 Encounter for supervision of other normal pregnancy, unspecified trimester: Secondary | ICD-10-CM

## 2020-04-27 DIAGNOSIS — Z8632 Personal history of gestational diabetes: Secondary | ICD-10-CM | POA: Insufficient documentation

## 2020-04-27 DIAGNOSIS — Z98891 History of uterine scar from previous surgery: Secondary | ICD-10-CM | POA: Insufficient documentation

## 2020-04-27 DIAGNOSIS — N838 Other noninflammatory disorders of ovary, fallopian tube and broad ligament: Secondary | ICD-10-CM

## 2020-04-27 DIAGNOSIS — Z3A14 14 weeks gestation of pregnancy: Secondary | ICD-10-CM

## 2020-04-27 DIAGNOSIS — Z3482 Encounter for supervision of other normal pregnancy, second trimester: Secondary | ICD-10-CM

## 2020-04-27 DIAGNOSIS — Z363 Encounter for antenatal screening for malformations: Secondary | ICD-10-CM

## 2020-04-27 DIAGNOSIS — Z1379 Encounter for other screening for genetic and chromosomal anomalies: Secondary | ICD-10-CM

## 2020-04-27 MED ORDER — BLOOD PRESSURE CUFF MISC: 1.0000 | Freq: Every day | 0 refills | Status: DC

## 2020-04-27 MED ORDER — PRENATAL PLUS 27-1 MG PO TABS: 1.0000 | ORAL_TABLET | Freq: Every day | ORAL | 11 refills | Status: DC

## 2020-04-27 NOTE — Progress Notes (Signed)
INITIAL OBSTETRICAL VISIT Patient name: Colleen Arnold MRN [id number]  Date of birth: Aug 17, 1986 Chief Complaint:   Initial Prenatal Visit  History of Present Illness:   Colleen Arnold is a 34 y.o. G39P1001 Hispanic female at [redacted]w[redacted]d by [place] at 9 weeks with an Estimated Date of Delivery: 10/25/20 being seen today for her initial obstetrical visit.   Her obstetrical history is significant for elective primary c/s for suspected fetal macrosomia w/ A1DM, baby only weighed 8lb6oz- pt wants TOLAC.   Today she reports no complaints.  Depression screen [facility] 2/9 04/27/2020  Decreased Interest 0  Down, Depressed, Hopeless 0  PHQ - 2 Score 0  Altered sleeping 1  Tired, decreased energy 1  Change in appetite 0  Feeling bad or failure about yourself  0  Trouble concentrating 0  Moving slowly or fidgety/restless 0  Suicidal thoughts 0  PHQ-9 Score 2    Patient's last menstrual period was 12/24/2019. Last pap 2020 [facility]. Results were: normal Review of Systems:   Pertinent items are noted in HPI Denies cramping/contractions, leakage of fluid, vaginal bleeding, abnormal vaginal discharge w/ itching/odor/irritation, headaches, visual changes, shortness of breath, chest pain, abdominal pain, severe nausea/vomiting, or problems with urination or bowel movements unless otherwise stated above.  Pertinent History Reviewed:  Reviewed past medical,surgical, social, obstetrical and family history.  Reviewed problem list, medications and allergies. OB History  Gravida Para Term Preterm AB Living  2 1 1     1   SAB TAB Ectopic Multiple Live Births          1    # Outcome Date GA Lbr Len/2nd Weight Sex Delivery Anes PTL Lv  2 Current           1 Term 07/19/08   8 lb 6 oz (3.799 kg) F CS-LTranv  N LIV   Physical Assessment:   Vitals:   04/27/20 1015 04/27/20 1017  BP: 114/65   Pulse: 77   Weight: 249 lb (112.9 kg)   Height:  5' 2.5" (1.588 m)  Body mass index is 44.82 kg/m.       Physical  Examination:  General appearance - well appearing, and in no distress  Mental status - alert, oriented to person, place, and time  Psych:  She has a normal mood and affect  Skin - warm and dry, normal color, no suspicious lesions noted  Chest - effort normal, all lung fields clear to auscultation bilaterally  Heart - normal rate and regular rhythm  Abdomen - soft, nontender  Extremities:  No swelling or varicosities noted  Thin prep pap is not done   TODAY'S FHT: 145 via doppler  No results found for this or any previous visit (from the past 24 hour(s)).  Assessment & Plan:  1) Low-Risk Pregnancy G2P1001 at [redacted]w[redacted]d with an Estimated Date of Delivery: 10/25/20   2) Initial OB visit  3) H/O GDM> A1C today  4) Prev c/s> wants TOLAC, request op note from [facility] today  Meds:  Meds ordered this encounter  Medications  . Blood Pressure Monitoring (BLOOD PRESSURE CUFF) MISC    Sig: 1 Device by Does not apply route daily.    Dispense:  1 each    Refill:  0  . prenatal vitamin w/FE, FA (PRENATAL 1 + 1) 27-1 MG TABS tablet    Sig: Take 1 tablet by mouth daily at 12 noon.    Dispense:  30 tablet    Refill:  11    Order  Specific Question:   Supervising Provider    Answer:   Florian Buff [[id number]]    Initial labs obtained Continue prenatal vitamins Reviewed n/v relief measures and warning s/s to report Reviewed recommended weight gain based on pre-gravid BMI Encouraged well-balanced diet Genetic & carrier screening discussed: requests Panorama, declines NT/IT, AFP and Horizon 14  Ultrasound discussed; fetal survey: requested CCNC completed> form faxed if has or is planning to apply for medicaid The nature of [facility] for [employer] with multiple MDs and other Advanced Practice Providers was explained to patient; also emphasized that fellows, residents, and students are part of our team. Does not have home bp cuff. Rx faxed to [facility]. Check bp weekly, let us know if  >140/90.   Follow-up: Return in about 4 weeks (around 05/25/2020) for LROB, OV:ZCHYIFO, CNM, in person; get pap from [facility] & c/s note from [facility] please.   Orders Placed This Encounter  Procedures  . Urine Culture  . GC/Chlamydia Probe Amp  . US OB Comp + 14 Wk  . Pain Management Screening Profile (10S)  . CBC/D/Plt+RPR+Rh+ABO+Rub Ab...  . Genetic Screening  . Sickle cell screen  . Hemoglobin A1c    Roma Schanz CNM, [facility] 04/27/2020 11:32 AM

## 2020-04-27 NOTE — Patient Instructions (Signed)
Colleen Arnold, I greatly value your feedback.  If you receive a survey following your visit with [place] today, we appreciate you taking the time to fill it out.  Thanks, Colleen Arnold, CNM, WHNP-BC  [facility] [place] [facility] ([place], [place] [address]) Entrance C, located off of [place] parking  Go to [email or web address] to register for FREE online childbirth classes  [place] Pediatricians/Family Doctors:  [facility] [place] [phone]                 [place] [phone] (usually not accepting new patients unless you have family there already, you are always welcome to call and ask)       [facility] Department [phone]       [facility] Pediatricians/Family Doctors:   [facility]: [phone]  [facility]: [phone]  [facility]: [place] Doctors:   [facility]: [facility]: [place]:  [place]: [phone]    Home Blood Pressure Monitoring for Patients   Your provider has recommended that you check your blood pressure (BP) at least once a week at home. If you do not have a blood pressure cuff at home, one will be provided for you. Contact your provider if you have not received your monitor within 1 week.   Helpful Tips for Accurate Home Blood Pressure Checks  . Don't smoke, exercise, or drink caffeine 30 minutes before checking your BP . Use the restroom before checking your BP (a full bladder can raise your pressure) . Relax in a comfortable upright chair . Feet on the ground . Left arm resting comfortably on a flat surface at the level of your heart . Legs uncrossed . Back supported . Sit quietly and don't talk . Place the cuff on your bare arm . Adjust snuggly, so  that only two fingertips can fit between your skin and the top of the cuff . Check 2 readings separated by at least one minute . Keep a log of your BP readings . For a visual, please reference this diagram: http://ccnc.care/bpdiagram  Provider Name: [facility]     Phone: [phone]  Zone 1: ALL CLEAR  Continue to monitor your symptoms:  . BP reading is less than 140 (top number) or less than 90 (bottom number)  . No right upper stomach pain . No headaches or seeing spots . No feeling nauseated or throwing up . No swelling in face and hands  Zone 2: CAUTION Call your doctor's office for any of the following:  . BP reading is greater than 140 (top number) or greater than 90 (bottom number)  . Stomach pain under your ribs in the middle or right side . Headaches or seeing spots . Feeling nauseated or throwing up . Swelling in face and hands  Zone 3: EMERGENCY  Seek immediate medical care if you have any of the following:  . BP reading is greater than160 (top number) or greater than 110 (bottom number) . Severe headaches not improving with Tylenol . Serious difficulty catching your breath . Any worsening symptoms from Zone 2   Segundo trimestre de [employer] Trimester of Pregnancy El segundo trimestre va desde la semana14 hasta la 55, desde el cuarto hasta el sexto mes, y suele ser el momento en el  que mejor se siente. Su organismo se ha adaptado a Public relations account executive, y comienza a Print production planner. En general, las nuseas matutinas han disminuido o han desaparecido completamente, puede tener ms energa y un aumento de apetito. El segundo trimestre es tambin la poca en la que el feto se desarrolla rpidamente. Hacia el final del sexto mes, el feto mide aproximadamente 9pulgadas (23cm) y pesa alrededor de 1 libras (700g). Es probable que sienta que el beb se Software engineer (da pataditas) entre las 86 y [place]. Cambios en el cuerpo durante el segundo  trimestre Su cuerpo continua experimentando numerosos cambios durante su segundo trimestre. Estos cambios varan de [place] a [place].  Seguir [employer]. Notar que la parte baja del abdomen sobresale.  Podrn aparecer las primeras Apache Corporation caderas, el abdomen y las [place].  Es posible que tenga dolores de cabeza que pueden aliviarse con ciertos medicamentos. Los medicamentos que tome deben estar aprobados por el mdico.  Tal vez tenga necesidad de orinar con ms frecuencia porque el feto est ejerciendo presin sobre la vejiga.  Debido al Glennis Brink podr sentir Victorio Palm estomacal con frecuencia.  Puede estar estreida, ya que ciertas hormonas enlentecen los movimientos de los msculos que J[employer] Co desechos a travs de los intestinos.  Pueden aparecer hemorroides o abultarse e hincharse las venas (venas varicosas).  Puede sentir dolor en la espalda. Esto se debe a: ? Aumento de peso. ? Las hormonas del [place] articulaciones en la pelvis. ? Un cambio en el peso y los msculos que ayudan a Theatre manager su equilibrio.  Sus pechos seguirn creciendo y se pondrn cada vez ms sensibles.  Las Production manager y estar sensibles al cepillado y al hilo dental.  Pueden aparecer zonas oscuras o manchas (cloasma, mscara del [place]) en el rostro. Esto probablemente se atenuar despus del nacimiento del beb.  Es posible que se forme una lnea oscura desde el ombligo hasta la zona del pubis (linea nigra). Esto probablemente se atenuar despus del nacimiento del beb.  Tal vez haya cambios en el cabello. Esto cambios pueden incluir su engrosamiento, crecimiento rpido y Harley[employer]Davidson textura. Adems, a algunas mujeres se les cae el cabello durante o despus del embarazo, o tienen el cabello seco o fino. Lo ms probable es que el cabello se le normalice despus del nacimiento del beb. Qu debe esperar en las visitas prenatales Durante una visita prenatal de  rutina:  La pesarn para asegurarse de que usted y el feto estn creciendo normalmente.  Le tomarn la presin arterial.  Le medirn el abdomen para controlar el desarrollo del beb.  Se escucharn los latidos cardacos fetales.  Se evaluarn los resultados de los estudios solicitados en visitas anteriores. El mdico puede preguntarle lo siguiente:  Cmo se siente.  Si siente los movimientos del beb.  Si ha tenido sntomas anormales, como prdida de lquido, [place], dolores de cabeza intensos o clicos abdominales.  Si est consumiendo algn producto que contenga tabaco, como cigarrillos, tabaco de Higher education careers adviser y Psychologist, sport and exercise.  Si tiene [place]. Otros estudios que podrn realizarse durante el segundo trimestre incluyen lo siguiente:  Anlisis de sangre para detectar lo siguiente: ? Concentraciones de hierro bajas (anemia). ? Nivel alto de azcar en la sangre que afecta a las mujeres embarazadas (diabetes gestacional) entre las semanas 24 y 60. ? Anticuerpos Rh. Esto es para detectar una protena en los glbulos rojos (factor Rh).  Anlisis de orina para detectar infecciones, diabetes o  protenas en la orina.  Una ecografa para confirmar que el beb crece y se desarrolla correctamente.  Una amniocentesis para diagnosticar posibles problemas genticos.  Estudios del feto para descartar espina bfida y sndrome de Down.  Prueba del VIH (virus de inmunodeficiencia humana). Los exmenes prenatales de rutina incluyen la prueba de deteccin del VIH, a menos que decida no Radiation protection practitioner. Siga estas indicaciones en su casa: [place] indicaciones del mdico en relacin con el uso de medicamentos. Durante el embarazo, hay medicamentos que pueden tomarse y 53 que no.  Tome vitaminas prenatales que contengan por lo menos 025ENIDPOEUMPN (?g) de cido flico.  Si est estreida, tome un laxante suave, si el mdico lo autoriza. Qu debe comer y  beber   Ill[place] Bolus una dieta equilibrada que incluya gran cantidad de frutas y verduras frescas, cereales integrales, buenas fuentes de protenas como carnes [place], huevos o tofu, y lcteos descremados. El mdico la ayudar a Office manager cantidad de peso que puede [place].  No coma carne cruda ni quesos sin cocinar. Estos elementos contienen grmenes que pueden causar defectos congnitos en el beb.  Si no consume muchos alimentos con calcio, hable con su mdico sobre si debera tomar un suplemento diario de calcio.  Limite el consumo de alimentos con alto contenido de grasas y azcares procesados, como alimentos fritos o dulces.  Para evitar el estreimiento: ? Bebe suficiente lquido para mantener la orina clara o de color amarillo plido. ? Consuma alimentos ricos en fibra, como frutas y verduras frescas, cereales integrales y frijoles. Actividad  Haga ejercicio solamente como se lo haya indicado el mdico. La mayora de las mujeres pueden continuar su rutina de ejercicios durante el [place]. Intente realizar como mnimo 74minutos de actividad fsica por lo menos 5das a la semana. Deje de hacer ejercicio si experimenta contracciones uterinas.  No levante objetos pesados, use zapatos de tacones bajos y mantenga una buena postura.  Puede seguir [employer], a menos que el mdico le indique lo contrario. Alivio del dolor y del Tree surgeon  Use un sostn que le brinde buen soporte para prevenir las molestias causadas por la sensibilidad en los pechos.  Dese baos de asiento con agua tibia para Best boy o las molestias causadas por las hemorroides. Use una crema para las hemorroides si el mdico la autoriza.  Descanse con las piernas elevadas si tiene calambres o dolor de cintura.  Si tiene venas varicosas, use medias de descanso. Eleve los pies durante 69minutos, 3 o 4veces por da. Limite el consumo de sal en su dieta. Cuidados prenatales  Escriba sus  preguntas. Llvelas cuando concurra a las visitas prenatales.  Concurra a todas las visitas prenatales tal como se lo haya indicado el mdico. Esto es importante. Seguridad  Use el cinturn de seguridad en todo momento mientras conduce.  Haga una lista de los nmeros de telfono de Freight forwarder, que BJ's nmeros de telfono de familiares, [place], el hospital y los departamentos de polica y bomberos. Instrucciones generales  [place] al mdico que la derive a clases de educacin prenatal en su localidad. Debe comenzar a tomar las clases antes de que empiece el mes6 de [place].  Pida ayuda si tiene necesidades nutricionales o de asesoramiento Solicitor. El mdico puede aconsejarla o derivarla a especialistas para que la ayuden con diferentes necesidades.  No se d baos de inmersin en agua caliente, baos turcos ni saunas.  No se haga duchas vaginales ni use tampones o toallas higinicas perfumadas.  No mantenga las piernas cruzadas durante mucho tiempo.  Evite el contacto con las bandejas sanitarias de los gatos y la tierra que estos animales usan. Estos elementos contienen bacterias que pueden causar defectos congnitos al beb y la posible prdida del feto debido a un aborto espontneo o muerte fetal.  Evite fumar, consumir hierbas, beber alcohol y tomar frmacos que no le hayan recetado. Las sustancias qumicas que estos productos contienen pueden afectar la formacin y el desarrollo del beb.  No consuma ningn producto que contenga nicotina o tabaco, como cigarrillos y Psychologist, sport and exercise. Si necesita ayuda para dejar de fumar, consulte al [employer].  Visite a su dentista si an no lo ha [place]arry manager. Use un cepillo de dientes blando para higienizarse los dientes y psese el hilo dental con suavidad. Comunquese con un mdico si:  Tiene mareos.  Siente clicos leves, presin en la pelvis o dolor persistente en el abdomen.  Tiene nuseas, vmitos o  diarrea persistentes.  Margette Fast secrecin vaginal con mal olor.  Siente dolor al Continental Airlines. Solicite ayuda de inmediato si:  Tiene fiebre.  Tiene una prdida de lquido por la vagina.  Tiene sangrado o pequeas prdidas vaginales.  Siente dolor intenso o clicos en el abdomen.  Sube de peso o baja de peso rpidamente.  Tiene dificultad para respirar y siente dolor de pecho.  Sbitamente se le hinchan mucho el rostro, las [place], los tobillos, los pies o las piernas.  No ha sentido los movimientos del beb durante Leone Brand.  Siente un dolor de cabeza intenso que no se alivia al tomar Dynegy.  Nota cambios en la visin. Resumen  El segundo trimestre va desde la semana14 hasta la 67, desde el cuarto hasta el sexto mes. Es tambin una poca en la que el feto se desarrolla rpidamente.  Su organismo atraviesa por muchos cambios durante el [place]. Estos cambios varan de [place] a [place].  Evite fumar, consumir hierbas, beber alcohol y tomar frmacos que no le hayan recetado. Estas sustancias qumicas afectan la formacin y el desarrollo de su beb.  No consuma ningn producto que contenga tabaco, lo que incluye cigarrillos, tabaco de Higher education careers adviser y Psychologist, sport and exercise. Si necesita ayuda para dejar de fumar, consulte al mdico.  Comunquese con su mdico si tiene preguntas sobre esto. Concurra a todas las visitas prenatales tal como se lo haya indicado el mdico. Esto es importante. Esta informacin no tiene Marine scientist el consejo del mdico. Asegrese de hacerle al mdico cualquier pregunta que tenga. Document Revised: 02/13/2017 Document Reviewed: 02/13/2017 Elsevier Patient Education  [place].

## 2020-04-28 ENCOUNTER — Encounter: Payer: Self-pay | Admitting: Women's Health

## 2020-04-28 DIAGNOSIS — R7303 Prediabetes: Secondary | ICD-10-CM | POA: Insufficient documentation

## 2020-04-28 DIAGNOSIS — O09899 Supervision of other high risk pregnancies, unspecified trimester: Secondary | ICD-10-CM | POA: Insufficient documentation

## 2020-04-28 DIAGNOSIS — Z2839 Other underimmunization status: Secondary | ICD-10-CM | POA: Insufficient documentation

## 2020-04-28 DIAGNOSIS — O99891 Other specified diseases and conditions complicating pregnancy: Secondary | ICD-10-CM | POA: Insufficient documentation

## 2020-04-28 LAB — CBC/D/PLT+RPR+RH+ABO+RUB AB...
Antibody Screen: NEGATIVE
Basophils Absolute: 0 10*3/uL (ref 0.0–0.2)
Basos: 1 %
EOS (ABSOLUTE): 0 10*3/uL (ref 0.0–0.4)
Eos: 1 %
HCV Ab: <0.1 s/co ratio (ref 0.0–0.9)
HIV Screen 4th Generation wRfx: NONREACTIVE
Hematocrit: 38.3 % (ref 34.0–46.6)
Hemoglobin: 13 g/dL (ref 11.1–15.9)
Hepatitis B Surface Ag: NEGATIVE
Immature Grans (Abs): 0.1 10*3/uL (ref 0.0–0.1)
Immature Granulocytes: 1 %
Lymphocytes Absolute: 1.8 10*3/uL (ref 0.7–3.1)
Lymphs: 24 %
MCH: 32 pg (ref 26.6–33.0)
MCHC: 33.9 g/dL (ref 31.5–35.7)
MCV: 94 fL (ref 79–97)
Monocytes Absolute: 0.4 10*3/uL (ref 0.1–0.9)
Monocytes: 6 %
Neutrophils Absolute: 5.3 10*3/uL (ref 1.4–7.0)
Neutrophils: 67 %
Platelets: 223 10*3/uL (ref 150–450)
RBC: 4.06 x10E6/uL (ref 3.77–5.28)
RDW: 12.8 % (ref 11.7–15.4)
RPR Ser Ql: NONREACTIVE
Rh Factor: POSITIVE
Rubella Antibodies, IGG: <0.9 index — ABNORMAL LOW (ref >0.99)
WBC: 7.7 10*3/uL (ref 3.4–10.8)

## 2020-04-28 LAB — HCV INTERPRETATION

## 2020-04-28 LAB — HEMOGLOBIN A1C
Est. average glucose Bld gHb Est-mCnc: 117 mg/dL
Hgb A1c MFr Bld: 5.7 % — ABNORMAL HIGH (ref 4.8–5.6)

## 2020-04-29 ENCOUNTER — Telehealth: Payer: Self-pay | Admitting: Women's Health

## 2020-04-29 NOTE — Telephone Encounter (Signed)
Patient called stating that she would like to know the results of her lab work for the genetic testing she had. Pt states that she would like the nurse to use the language line for the interpreter because she has a lot of other question to ask and she speaks better in [place]. Please contact pt

## 2020-04-29 NOTE — Telephone Encounter (Signed)
Daisy to call patient to inform her that we don't have any genetic test results back yet.

## 2020-05-03 LAB — URINE CULTURE

## 2020-05-03 LAB — GC/CHLAMYDIA PROBE AMP
Chlamydia trachomatis, NAA: NEGATIVE
Neisseria Gonorrhoeae by PCR: NEGATIVE

## 2020-05-03 LAB — PMP SCREEN PROFILE (10S), URINE
Amphetamine Scrn, Ur: NEGATIVE ng/mL
BARBITURATE SCREEN URINE: NEGATIVE ng/mL
BENZODIAZEPINE SCREEN, URINE: NEGATIVE ng/mL
CANNABINOIDS UR QL SCN: NEGATIVE ng/mL
Cocaine (Metab) Scrn, Ur: NEGATIVE ng/mL
Creatinine(Crt), U: 154.5 mg/dL (ref 20.0–300.0)
Methadone Screen, Urine: NEGATIVE ng/mL
OXYCODONE+OXYMORPHONE UR QL SCN: NEGATIVE ng/mL
Opiate Scrn, Ur: NEGATIVE ng/mL
Ph of Urine: 6.2 (ref 4.5–8.9)
Phencyclidine Qn, Ur: NEGATIVE ng/mL
Propoxyphene Scrn, Ur: NEGATIVE ng/mL

## 2020-05-03 LAB — SPECIMEN STATUS REPORT

## 2020-05-04 ENCOUNTER — Other Ambulatory Visit: Payer: Self-pay | Admitting: Women's Health

## 2020-05-04 DIAGNOSIS — R8271 Bacteriuria: Secondary | ICD-10-CM | POA: Insufficient documentation

## 2020-05-04 LAB — CYSTIC FIBROSIS MUTATION 97: Interpretation: NOT DETECTED

## 2020-05-04 LAB — SICKLE CELL SCREEN: Sickle Cell Screen: NEGATIVE

## 2020-05-04 MED ORDER — NITROFURANTOIN MONOHYD MACRO 100 MG PO CAPS: 100.0000 mg | ORAL_CAPSULE | Freq: Two times a day (BID) | ORAL | 0 refills | Status: DC

## 2020-05-05 ENCOUNTER — Telehealth: Payer: Self-pay | Admitting: Women's Health

## 2020-05-05 NOTE — Telephone Encounter (Signed)
Patient would like someone to give her call regarding her test results on 04/27/2020

## 2020-05-05 NOTE — Telephone Encounter (Signed)
Telephoned patient at home number and advised patient urine culture did show UTI. Medication is at the pharmacy. To view lab results would need to setup [employer] account. Patient voiced understanding.

## 2020-05-07 ENCOUNTER — Telehealth: Payer: Self-pay | Admitting: Women's Health

## 2020-05-07 NOTE — Telephone Encounter (Signed)
Telephoned patient at home and patient wanted friend to know results of gender. Advised patient could not release that information. Patient will try to log on to the [facility] website again.

## 2020-05-07 NOTE — Telephone Encounter (Signed)
Pt is calling to get results of gender test/ I asked pt and she said she does not have her my chart set up yet

## 2020-05-11 ENCOUNTER — Telehealth: Payer: Self-pay | Admitting: Obstetrics & Gynecology

## 2020-05-11 NOTE — Telephone Encounter (Signed)
Patient called stating that she has been feeling dizziness, weakness and double vision. Pt thinks is could be something with her blood pressure. Pt also states that her friend tried to help her get into the app to check to see if her baby had an genetic issues and to check the sex of the baby but was not able to get in. Pt also states that she had blood work done and would like to know the results of those. Please contact pt

## 2020-05-12 ENCOUNTER — Telehealth: Payer: Self-pay | Admitting: *Deleted

## 2020-05-12 ENCOUNTER — Telehealth: Payer: Self-pay | Admitting: Women's Health

## 2020-05-12 NOTE — Telephone Encounter (Signed)
Patient called stating that she has been feeling dizziness, weakness and double vision. Pt thinks is could be something with her blood pressure. Pt also states that her friend tried to help her get into the app to check to see if her baby had an genetic issues and to check the sex of the baby but was not able to get in. Pt also states that she had blood work done and would like to know the results of those. Please contact pt

## 2020-05-12 NOTE — Telephone Encounter (Signed)
Patient states her friend created her account on [employer]'s website but is unable to access the results.  Patient states she wants to know the gender.  Informed patient I could not call her friend with this information as this would be a HIPPA violation. Patient stated she understood and was ok with me telling her the gender. Patient informed she is having girl. Also states she is haivng dizzy spells and blurred vision at times.  Has not received her BP cuff yet.  Advised if she is out and can check it at a pharmacy or [place], to do so and let us know if it is elevated.  Also encouraged to eat small, frequent snacks high in protein along with pushing some extra fluids.  Pt verbalized understanding with all questions answered.

## 2020-05-20 ENCOUNTER — Encounter: Payer: Self-pay | Admitting: *Deleted

## 2020-05-25 ENCOUNTER — Other Ambulatory Visit: Payer: Self-pay

## 2020-05-25 ENCOUNTER — Ambulatory Visit (INDEPENDENT_AMBULATORY_CARE_PROVIDER_SITE_OTHER): Payer: Self-pay

## 2020-05-25 ENCOUNTER — Ambulatory Visit (INDEPENDENT_AMBULATORY_CARE_PROVIDER_SITE_OTHER): Payer: Self-pay | Admitting: Obstetrics & Gynecology

## 2020-05-25 VITALS — BP 122/75 | HR 80 | Wt 256.0 lb

## 2020-05-25 DIAGNOSIS — Z3A18 18 weeks gestation of pregnancy: Secondary | ICD-10-CM

## 2020-05-25 DIAGNOSIS — Z3480 Encounter for supervision of other normal pregnancy, unspecified trimester: Secondary | ICD-10-CM

## 2020-05-25 DIAGNOSIS — Z363 Encounter for antenatal screening for malformations: Secondary | ICD-10-CM

## 2020-05-25 DIAGNOSIS — Z3482 Encounter for supervision of other normal pregnancy, second trimester: Secondary | ICD-10-CM

## 2020-05-25 DIAGNOSIS — Z331 Pregnant state, incidental: Secondary | ICD-10-CM

## 2020-05-25 DIAGNOSIS — Z1389 Encounter for screening for other disorder: Secondary | ICD-10-CM

## 2020-05-25 DIAGNOSIS — Z8632 Personal history of gestational diabetes: Secondary | ICD-10-CM

## 2020-05-25 LAB — POCT URINALYSIS DIPSTICK OB
Glucose, UA: NEGATIVE
Ketones, UA: NEGATIVE
Leukocytes, UA: NEGATIVE
Nitrite, UA: NEGATIVE
POC,PROTEIN,UA: NEGATIVE

## 2020-05-25 NOTE — Progress Notes (Signed)
[place] 19+7 wks,cephalic,cx 4 cm,posterior placenta gr 0,svp of fluid 6.2 cm,normal right ovary,left oophorectomy,fhr 138 bpm,efw 251 g 76%,anatomy complete,no obvious abnormalities

## 2020-05-25 NOTE — Progress Notes (Signed)
   LOW-RISK PREGNANCY VISIT Patient name: Colleen Arnold MRN [id number]  Date of birth: 11/26/1985 Chief Complaint:   Routine Prenatal Visit (Utrasound)  History of Present Illness:   Colleen Arnold is a 34 y.o. G30P1001 female at [redacted]w[redacted]d with an Estimated Date of Delivery: 10/25/20 being seen today for ongoing management of a low-risk pregnancy.  Depression screen [facility] 2/9 04/27/2020  Decreased Interest 0  Down, Depressed, Hopeless 0  PHQ - 2 Score 0  Altered sleeping 1  Tired, decreased energy 1  Change in appetite 0  Feeling bad or failure about yourself  0  Trouble concentrating 0  Moving slowly or fidgety/restless 0  Suicidal thoughts 0  PHQ-9 Score 2    Today she reports no complaints. Contractions: Not present. Vag. Bleeding: None.  Movement: Present. denies leaking of fluid. Review of Systems:   Pertinent items are noted in HPI Denies abnormal vaginal discharge w/ itching/odor/irritation, headaches, visual changes, shortness of breath, chest pain, abdominal pain, severe nausea/vomiting, or problems with urination or bowel movements unless otherwise stated above. Pertinent History Reviewed:  Reviewed past medical,surgical, social, obstetrical and family history.  Reviewed problem list, medications and allergies. Physical Assessment:   Vitals:   05/25/20 0929  BP: 122/75  Pulse: 80  Weight: 256 lb (116.1 kg)  Body mass index is 46.08 kg/m.        Physical Examination:   General appearance: Well appearing, and in no distress  Mental status: Alert, oriented to person, place, and time  Skin: Warm & dry  Cardiovascular: Normal heart rate noted  Respiratory: Normal respiratory effort, no distress  Abdomen: Soft, gravid, nontender  Pelvic: Cervical exam deferred         Extremities: Edema: None  Fetal Status:     Movement: Present    Chaperone: n/a    Results for orders placed or performed in visit on 05/25/20 (from the past 24 hour(s))  POC Urinalysis Dipstick OB    Collection Time: 05/25/20  9:30 AM  Result Value Ref Range   Color, UA     Clarity, UA     Glucose, UA Negative Negative   Bilirubin, UA     Ketones, UA neg    Spec Grav, UA     Blood, UA large    pH, UA     POC,PROTEIN,UA Negative Negative, Trace, Small (1+), Moderate (2+), Large (3+), 4+   Urobilinogen, UA     Nitrite, UA neg    Leukocytes, UA Negative Negative   Appearance     Odor      Assessment & Plan:  1) Low-risk pregnancy G2P1001 at [redacted]w[redacted]d with an Estimated Date of Delivery: 10/25/20   2) Previous C section, would like to try a TOLAC   Meds: No orders of the defined types were placed in this encounter.  Labs/procedures today:   Plan:  Continue routine obstetrical care  Next visit: prefers in person    Reviewed: Preterm labor symptoms and general obstetric precautions including but not limited to vaginal bleeding, contractions, leaking of fluid and fetal movement were reviewed in detail with the patient.  All questions were answered. Has home bp cuff. Rx faxed to . Check bp weekly, let us know if >140/90.   Follow-up: No follow-ups on file.  Orders Placed This Encounter  Procedures  . Urine Culture  . GC/Chlamydia Probe Amp  . Pain Management Screening Profile (10S)  . POC Urinalysis Dipstick OB   Florian Buff  05/25/2020 9:52 AM

## 2020-05-26 LAB — PMP SCREEN PROFILE (10S), URINE
Amphetamine Scrn, Ur: NEGATIVE ng/mL
BARBITURATE SCREEN URINE: NEGATIVE ng/mL
BENZODIAZEPINE SCREEN, URINE: NEGATIVE ng/mL
CANNABINOIDS UR QL SCN: NEGATIVE ng/mL
Cocaine (Metab) Scrn, Ur: NEGATIVE ng/mL
Creatinine(Crt), U: 58.8 mg/dL (ref 20.0–300.0)
Methadone Screen, Urine: NEGATIVE ng/mL
OXYCODONE+OXYMORPHONE UR QL SCN: NEGATIVE ng/mL
Opiate Scrn, Ur: NEGATIVE ng/mL
Ph of Urine: 6.9 (ref 4.5–8.9)
Phencyclidine Qn, Ur: NEGATIVE ng/mL
Propoxyphene Scrn, Ur: NEGATIVE ng/mL

## 2020-05-26 LAB — GC/CHLAMYDIA PROBE AMP
Chlamydia trachomatis, NAA: NEGATIVE
Neisseria Gonorrhoeae by PCR: NEGATIVE

## 2020-05-27 LAB — URINE CULTURE

## 2020-06-17 ENCOUNTER — Telehealth: Payer: Self-pay | Admitting: Women's Health

## 2020-06-17 NOTE — Telephone Encounter (Signed)
Patient called stating that she has been experiencing Heartburn in the last couple of weeks. Pt states that she was told to take [place] but she is scared because she is pregnant. Pt would like to know if the provider could call her in medicine in to the [facility] in [place]. Please contact pt

## 2020-06-18 ENCOUNTER — Telehealth: Payer: Self-pay | Admitting: Women's Health

## 2020-06-18 ENCOUNTER — Other Ambulatory Visit: Payer: Self-pay | Admitting: Women's Health

## 2020-06-18 MED ORDER — PANTOPRAZOLE SODIUM 20 MG PO TBEC
20.0000 mg | DELAYED_RELEASE_TABLET | Freq: Every day | ORAL | 6 refills | Status: DC
Start: 2020-06-18 — End: 2020-10-21

## 2020-06-18 NOTE — Telephone Encounter (Signed)
Patient called stating that she is having numbness of her left leg. Pt states that it just falls asleep. Please contact pt

## 2020-06-19 NOTE — Telephone Encounter (Signed)
If she is noticing pain in the back of her leg/butt area its probably her sciatic nerve. She can take tylenol to help with this. We can discuss it more at the next visit.

## 2020-06-30 ENCOUNTER — Encounter: Payer: Self-pay | Admitting: Women's Health

## 2020-06-30 ENCOUNTER — Ambulatory Visit (INDEPENDENT_AMBULATORY_CARE_PROVIDER_SITE_OTHER): Payer: Self-pay | Admitting: Women's Health

## 2020-06-30 ENCOUNTER — Other Ambulatory Visit: Payer: Self-pay

## 2020-06-30 VITALS — BP 124/68 | HR 92 | Wt 267.0 lb

## 2020-06-30 DIAGNOSIS — Z3480 Encounter for supervision of other normal pregnancy, unspecified trimester: Secondary | ICD-10-CM

## 2020-06-30 DIAGNOSIS — Z331 Pregnant state, incidental: Secondary | ICD-10-CM

## 2020-06-30 DIAGNOSIS — Z1389 Encounter for screening for other disorder: Secondary | ICD-10-CM

## 2020-06-30 DIAGNOSIS — R319 Hematuria, unspecified: Secondary | ICD-10-CM

## 2020-06-30 DIAGNOSIS — Z3482 Encounter for supervision of other normal pregnancy, second trimester: Secondary | ICD-10-CM

## 2020-06-30 LAB — POCT URINALYSIS DIPSTICK OB
Blood, UA: 3
Glucose, UA: NEGATIVE
Ketones, UA: NEGATIVE
Leukocytes, UA: NEGATIVE
Nitrite, UA: NEGATIVE
POC,PROTEIN,UA: NEGATIVE

## 2020-06-30 NOTE — Patient Instructions (Addendum)
Andreas Blower, I greatly value your feedback.  If you receive a survey following your visit with [place] today, we appreciate you taking the time to fill it out.  Thanks, Knute Neu, CNM, WHNP-BC   You will have your sugar test next visit.  Please do not eat or drink anything after midnight the night before you come, not even water.  You will be here for at least two hours.  Please make an appointment online for the bloodwork at [email or web address] for 8:30am (or as close to this as possible). Make sure you select the [facility] service center. The day of the appointment, check in with our office first, then you will go to [place] to start the sugar test.    [place] [facility] [place] [address] Entrance C, located off of [place] parking  Go to [email or web address] to register for FREE online childbirth classes   Call the office [phone]) or go to [facility] if:  You begin to have strong, frequent contractions  Your water breaks.  Sometimes it is a big gush of fluid, sometimes it is just a trickle that keeps getting your panties wet or running down your legs  You have vaginal bleeding.  It is normal to have a small amount of spotting if your cervix was checked.   You don't feel your baby moving like normal.  If you don't, get you something to eat and drink and lay down and focus on feeling your baby move.   If your baby is still not moving like normal, you should call the office or go to [place] Pediatricians/Family Doctors:  [place] [phone]                 [place] [phone] (usually not accepting new patients unless you have family there already, you are always welcome to call and ask)       [facility] Department [phone]       [facility] Pediatricians/Family Doctors:   [facility]:  [phone]  [facility]: [phone]  [facility]: [place] Doctors:   [facility]: [facility]: [place]:  [place]: [phone]   Home Blood Pressure Monitoring for Patients   Your provider has recommended that you check your blood pressure (BP) at least once a week at home. If you do not have a blood pressure cuff at home, one will be provided for you. Contact your provider if you have not received your monitor within 1 week.   Helpful Tips for Accurate Home Blood Pressure Checks  . Don't smoke, exercise, or drink caffeine 30 minutes before checking your BP . Use the restroom before checking your BP (a full bladder can raise your pressure) . Relax in a comfortable upright chair . Feet on the ground . Left arm resting comfortably on a flat surface at the level of your heart . Legs uncrossed . Back supported . Sit quietly and don't talk . Place the cuff on your bare arm . Adjust snuggly, so that only two fingertips can fit between your skin and the top of the cuff . Check 2 readings separated by at least one minute . Keep a log of your BP readings . For a visual, please reference  this diagram: http://ccnc.care/bpdiagram  Provider Name: [facility]     Phone: [phone]  Zone 1: ALL CLEAR  Continue to monitor your symptoms:  . BP reading is less than 140 (top number) or less than 90 (bottom number)  . No right upper stomach pain . No headaches or seeing spots . No feeling nauseated or throwing up . No swelling in face and hands  Zone 2: CAUTION Call your doctor's office for any of the following:  . BP reading is greater than 140 (top number) or greater than 90 (bottom number)  . Stomach pain under your ribs in the middle or right side . Headaches or seeing spots . Feeling nauseated or throwing up . Swelling in  face and hands  Zone 3: EMERGENCY  Seek immediate medical care if you have any of the following:  . BP reading is greater than160 (top number) or greater than 110 (bottom number) . Severe headaches not improving with Tylenol . Serious difficulty catching your breath . Any worsening symptoms from Zone 2     Segundo trimestre de Winn[employer]Dixie Trimester of Pregnancy El segundo trimestre va desde la semana14 hasta la 65, desde el cuarto hasta el sexto mes, y suele ser el momento en el que mejor se siente. Su organismo se ha adaptado a Public relations account executive, y comienza a Print production planner. En general, las nuseas matutinas han disminuido o han desaparecido completamente, puede tener ms energa y un aumento de apetito. El segundo trimestre es tambin la poca en la que el feto se desarrolla rpidamente. Hacia el final del sexto mes, el feto mide aproximadamente 9pulgadas (23cm) y pesa alrededor de 1 libras (700g). Es probable que sienta que el beb se Software engineer (da pataditas) entre las 20 y [place]. Cambios en el cuerpo durante el segundo trimestre Su cuerpo continua experimentando numerosos cambios durante su segundo trimestre. Estos cambios varan de [place] a [place].  Seguir [employer]. Notar que la parte baja del abdomen sobresale.  Podrn aparecer las primeras Apache Corporation caderas, el abdomen y las Adrian.  Es posible que tenga dolores de cabeza que pueden aliviarse con ciertos medicamentos. Los medicamentos que tome deben estar aprobados por el mdico.  Tal vez tenga necesidad de orinar con ms frecuencia porque el feto est ejerciendo presin sobre la vejiga.  Debido al Glennis Brink podr sentir Victorio Palm estomacal con frecuencia.  Puede estar estreida, ya que ciertas hormonas enlentecen los movimientos de los msculos que J[employer] Co desechos a travs de los intestinos.  Pueden aparecer hemorroides o abultarse e hincharse las venas (venas  varicosas).  Puede sentir dolor en la espalda. Esto se debe a: ? Aumento de peso. ? Las hormonas del [place] articulaciones en la pelvis. ? Un cambio en el peso y los msculos que ayudan a Theatre manager su equilibrio.  Sus pechos seguirn creciendo y se pondrn cada vez ms sensibles.  Las Production manager y estar sensibles al cepillado y al hilo dental.  Pueden aparecer zonas oscuras o manchas (cloasma, mscara del [place]) en el rostro. Esto probablemente se atenuar despus del nacimiento del beb.  Es posible que se forme una lnea oscura desde el ombligo hasta la zona del pubis (linea nigra). Esto probablemente se atenuar despus del nacimiento del beb.  Tal vez haya cambios en el cabello. Esto cambios pueden incluir su engrosamiento, crecimiento rpido y Harley-Davidson textura. Adems, a algunas mujeres se les cae el cabello durante o despus del  embarazo, o tienen el cabello seco o fino. Lo ms probable es que el cabello se le normalice despus del nacimiento del beb. Qu debe esperar en las visitas prenatales Durante una visita prenatal de rutina:  La pesarn para asegurarse de que usted y el feto estn creciendo normalmente.  Le tomarn la presin arterial.  Le medirn el abdomen para controlar el desarrollo del beb.  Se escucharn los latidos cardacos fetales.  Se evaluarn los resultados de los estudios solicitados en visitas anteriores. El mdico puede preguntarle lo siguiente:  Cmo se siente.  Si siente los movimientos del beb.  Si ha tenido sntomas anormales, como prdida de lquido, [place], dolores de cabeza intensos o clicos abdominales.  Si est consumiendo algn producto que contenga tabaco, como cigarrillos, tabaco de Higher education careers adviser y Psychologist, sport and exercise.  Si tiene [place]. Otros estudios que podrn realizarse durante el segundo trimestre incluyen lo siguiente:  Anlisis de sangre para detectar lo siguiente: ? Concentraciones  de hierro bajas (anemia). ? Nivel alto de azcar en la sangre que afecta a las mujeres embarazadas (diabetes gestacional) entre las semanas 24 y 43. ? Anticuerpos Rh. Esto es para detectar una protena en los glbulos rojos (factor Rh).  Anlisis de orina para detectar infecciones, diabetes o protenas en la orina.  Una ecografa para confirmar que el beb crece y se desarrolla correctamente.  Una amniocentesis para diagnosticar posibles problemas genticos.  Estudios del feto para descartar espina bfida y sndrome de Down.  Prueba del VIH (virus de inmunodeficiencia humana). Los exmenes prenatales de rutina incluyen la prueba de deteccin del VIH, a menos que decida no Radiation protection practitioner. Siga estas indicaciones en su casa: [place] indicaciones del mdico en relacin con el uso de medicamentos. Durante el embarazo, hay medicamentos que pueden tomarse y 23 que no.  Tome vitaminas prenatales que contengan por lo menos [id number] (?g) de cido flico.  Si est estreida, tome un laxante suave, si el mdico lo autoriza. Qu debe comer y beber   Illene Bolus una dieta equilibrada que incluya gran cantidad de frutas y verduras frescas, cereales integrales, buenas fuentes de protenas como carnes [place], huevos o tofu, y lcteos descremados. El mdico la ayudar a Office manager cantidad de peso que puede [place].  No coma carne cruda ni quesos sin cocinar. Estos elementos contienen grmenes que pueden causar defectos congnitos en el beb.  Si no consume muchos alimentos con calcio, hable con su mdico sobre si debera tomar un suplemento diario de calcio.  Limite el consumo de alimentos con alto contenido de grasas y azcares procesados, como alimentos fritos o dulces.  Para evitar el estreimiento: ? Bebe suficiente lquido para mantener la orina clara o de color amarillo plido. ? Consuma alimentos ricos en fibra, como frutas y verduras frescas, cereales integrales y  frijoles. Actividad  Haga ejercicio solamente como se lo haya indicado el mdico. La mayora de las mujeres pueden continuar su rutina de ejercicios durante el [place]. Intente realizar como mnimo 45minutos de actividad fsica por lo menos 5das a la semana. Deje de hacer ejercicio si experimenta contracciones uterinas.  No levante objetos pesados, use zapatos de tacones bajos y mantenga una buena postura.  Puede seguir American Electric Power, a menos que el mdico le indique lo contrario. Alivio del dolor y del Tree surgeon  Use un sostn que le brinde buen soporte para prevenir las molestias causadas por la sensibilidad en los pechos.  Dese baos de asiento con agua tibia para Best boy o  las molestias causadas por las hemorroides. Use una crema para las hemorroides si el mdico la autoriza.  Descanse con las piernas elevadas si tiene calambres o dolor de cintura.  Si tiene venas varicosas, use medias de descanso. Eleve los pies durante 77minutos, 3 o 4veces por da. Limite el consumo de sal en su dieta. Cuidados prenatales  Escriba sus preguntas. Llvelas cuando concurra a las visitas prenatales.  Concurra a todas las visitas prenatales tal como se lo haya indicado el mdico. Esto es importante. Seguridad  Use el cinturn de seguridad en todo momento mientras conduce.  Haga una lista de los nmeros de telfono de Freight forwarder, que BJ's nmeros de telfono de familiares, [place], el hospital y los departamentos de polica y bomberos. Instrucciones generales  Pdale al mdico que la derive a clases de educacin prenatal en su localidad. Debe comenzar a tomar las clases antes de que empiece el mes6 de [place].  Pida ayuda si tiene necesidades nutricionales o de asesoramiento Solicitor. El mdico puede aconsejarla o derivarla a especialistas para que la ayuden con diferentes necesidades.  No se d baos de inmersin en agua caliente, baos turcos ni  saunas.  No se haga duchas vaginales ni use tampones o toallas higinicas perfumadas.  No mantenga las piernas cruzadas durante mucho tiempo.  Evite el contacto con las bandejas sanitarias de los gatos y la tierra que estos animales usan. Estos elementos contienen bacterias que pueden causar defectos congnitos al beb y la posible prdida del feto debido a un aborto espontneo o muerte fetal.  Evite fumar, consumir hierbas, beber alcohol y tomar frmacos que no le hayan recetado. Las sustancias qumicas que estos productos contienen pueden afectar la formacin y el desarrollo del beb.  No consuma ningn producto que contenga nicotina o tabaco, como cigarrillos y Psychologist, sport and exercise. Si necesita ayuda para dejar de fumar, consulte al [employer]vaco.  Visite a su dentista si an no lo ha Quarry manager. Use un cepillo de dientes blando para higienizarse los dientes y psese el hilo dental con suavidad. Comunquese con un mdico si:  Tiene mareos.  Siente clicos leves, presin en la pelvis o dolor persistente en el abdomen.  Tiene nuseas, vmitos o diarrea persistentes.  Margette Fast secrecin vaginal con mal olor.  Siente dolor al Continental Airlines. Solicite ayuda de inmediato si:  Tiene fiebre.  Tiene una prdida de lquido por la vagina.  Tiene sangrado o pequeas prdidas vaginales.  Siente dolor intenso o clicos en el abdomen.  Sube de peso o baja de peso rpidamente.  Tiene dificultad para respirar y siente dolor de pecho.  Sbitamente se le hinchan mucho el rostro, las [place], los tobillos, los pies o las piernas.  No ha sentido los movimientos del beb durante Leone Brand.  Siente un dolor de cabeza intenso que no se alivia al tomar Dynegy.  Nota cambios en la visin. Resumen  El segundo trimestre va desde la semana14 hasta la 16, desde el cuarto hasta el sexto mes. Es tambin una poca en la que el feto se desarrolla rpidamente.  Su organismo atraviesa por  muchos cambios durante el [place]. Estos cambios varan de [place] a [place].  Evite fumar, consumir hierbas, beber alcohol y tomar frmacos que no le hayan recetado. Estas sustancias qumicas afectan la formacin y el desarrollo de su beb.  No consuma ningn producto que contenga tabaco, lo que incluye cigarrillos, tabaco de Higher education careers adviser y Psychologist, sport and exercise. Si necesita ayuda para dejar de fumar, consulte  al mdico.  Comunquese con su mdico si tiene preguntas sobre esto. Concurra a todas las visitas prenatales tal como se lo haya indicado el mdico. Esto es importante. Esta informacin no tiene Marine scientist el consejo del mdico. Asegrese de hacerle al mdico cualquier pregunta que tenga. Document Revised: 02/13/2017 Document Reviewed: 02/13/2017 Elsevier Patient Education  [place].   Rehabilitacin para la citica [facility] Pregunte al mdico qu ejercicios son seguros para usted. Haga los ejercicios exactamente como se lo haya indicado el mdico y gradelos como se lo hayan indicado. Es normal sentir un estiramiento leve, tironeo, opresin o Tree surgeon al Winn[employer]Dixie [place]. Detngase de inmediato si siente un dolor repentino o Printmaker. No comience a hacer estos ejercicios hasta que se lo indique el mdico. Ejercicios de elongacin y amplitud de movimiento Estos ejercicios calientan los msculos y las articulaciones, y mejoran el movimiento y la flexibilidad de la cadera y la espalda. Estos ejercicios tambin ayudan a Best boy, el adormecimiento y el hormigueo. Deslizamiento del nervio citico 1. Sintese en una silla con la cabeza hacia abajo en direccin al pecho. Coloque las manos detrs de la espalda. Deje caer los hombros hacia adelante. 2. Extienda lentamente una de las piernas mientras inclina la cabeza hacia atrs como si estuviera mirando hacia el techo. Solo extienda la pierna tan lejos como pueda sin empeorar los sntomas. 3. Mantenga  esta posicin durante __________ segundos. 4. Vuelva lentamente a la posicin inicial. 5. Repita el ejercicio con la otra pierna. Repita __________ veces. Realice este ejercicio __________ veces al da. Rodilla al pecho con aduccin de cadera y rotacin interna  1. Acustese boca arriba en una superficie firme con las piernas extendidas. 2. Flexione una rodilla y llvela hacia el pecho hasta que sienta un estiramiento suave en la parte inferior de la espalda y las nalgas. A continuacin, mueva la rodilla hacia el hombro que est en el lado contrario de la pierna. Esto se denomina rotacin interna y aduccin de la cadera. ? Mantenga la pierna en esta posicin tomando la parte frontal de la rodilla. 3. Mantenga esta posicin durante __________ segundos. 4. Vuelva lentamente a la posicin inicial. 5. Repita el ejercicio con la otra pierna. Repita __________ veces. Realice este ejercicio __________ veces al da. Extensin ArvinMeritor codos, en decbito prono  1. Acustese boca abajo sobre una superficie firme. La cama puede ser demasiado blanda para este ejercicio. 2. Apyese sobre los codos. 3. Con los brazos, aydese a Counselling psychologist sentir un leve estiramiento en el abdomen y la parte inferior de la espalda. ? Administrator, Civil Service de [employer] Corporation codos. Si no se siente cmodo, intente colocando almohadas debajo del pecho. ? Debe dejar la cadera inmvil sobre la superficie en la que est apoyado. Mantenga la cadera y los msculos de la espalda relajados. 4. Mantenga esta posicin durante __________ segundos. 5. Afloje lentamente la parte superior del cuerpo y vuelva a la posicin inicial. Repita __________ veces. Realice este ejercicio __________ veces al da. Ejercicios de fortalecimiento Estos ejercicios fortalecen la espalda y le otorgan resistencia. La resistencia es la capacidad de usar los msculos durante un tiempo prolongado, incluso despus de que se  cansen. Inclinacin de la pelvis Este ejercicio fortalece los msculos que se encuentran en la parte profunda del abdomen. 1. Acustese boca arriba sobre una superficie firme. Doble las rodillas y Rohm and Haas pies planos sobre el piso. 2. Tensione los msculos abdominales. Eleve la  pelvis hacia el techo y aplane la parte inferior de la espalda contra el suelo. ? Para realizar este ejercicio, puede colocar una toalla pequea debajo de la parte inferior de la espalda y presionar la espalda contra la toalla. 3. Mantenga esta posicin durante __________ segundos. 4. Relaje totalmente los msculos antes de repetir el ejercicio. Repita __________ veces. Realice este ejercicio __________ veces al da. Elevaciones alternadas de pierna y brazo  1. [place] manos y las rodillas sobre una superficie firme. Si est sobre un suelo duro, puede usar un elemento acolchado, como una alfombrilla para ejercicios, para apoyar las rodillas. 2. Alinee los brazos y las piernas. Las manos deben estar justo debajo de los hombros, y las rodillas debajo de la cadera. 3. Eleve la pierna izquierda hacia atrs. Al mismo tiempo, eleve el brazo derecho y Engineer, petroleum frente a usted. ? No eleve la pierna por encima de la cadera. ? No eleve el brazo por encima del hombro. ? Mantenga los msculos del abdomen y de la espalda contrados. ? Mantenga la cadera General Motors el suelo. ? No arquee la espalda. ? Mantenga el equilibrio con cuidado y no contenga la respiracin. 4. Mantenga esta posicin durante __________ segundos. 5. Vuelva lentamente a la posicin inicial. 6. Repita con su pierna derecha y su brazo izquierdo. Repita __________ veces. Realice este ejercicio __________ veces al da. Postura y Chad corporal La buena postura y la Engineer, agricultural corporal saludable pueden ayudar a Theatre stage manager estrs en las articulaciones y los tejidos del cuerpo. La Therapist, nutritional se refiere a los movimientos y a las  posiciones del cuerpo mientras realiza las actividades diarias. La postura es una parte de la Therapist, nutritional. Ardelia Mems buena postura significa que:  La columna est en su posicin natural de curvatura en S (neutral).  Los hombros estn Marathon Oil.  La cabeza no est inclinada hacia adelante. Siga esas pautas para mejorar la postura y Quarry manager en sus actividades diarias. De pie   Al estar de pie, mantenga la columna en la posicin neutral y los pies separados al ancho de caderas, aproximadamente. Mantenga las rodillas ligeramente flexionadas. Las [place], los hombros y las caderas deben estar alineados.  Cuando realice una tarea en la que deba estar de pie en el mismo sitio durante mucho tiempo, coloque un pie en un objeto estable de 2 a 4pulgadas (5a 10cm) de alto, como un taburete. Esto ayuda a que la columna mantenga una posicin neutral. Sentado   Cuando est sentado, mantenga la columna en posicin neutral y deje los pies apoyados en el suelo. Use un apoyapis, si es necesario, y Rohm and Haas muslos paralelos al suelo. Evite redondear los hombros e inclinar la cabeza hacia adelante.  Cuando trabaje en un escritorio o con una computadora, el escritorio debe estar a una altura en la que las manos estn un poco ms abajo que los codos. Deslice la silla debajo del escritorio, de modo de estar lo suficientemente cerca como para mantener una buena [place].  Cuando trabaje con una computadora, coloque el monitor a una altura que le permita mirar derecho hacia adelante, sin tener que inclinar la cabeza hacia adelante o [place] atrs. Reposo  Al descansar o estar acostado, evite las posiciones que le causen ms dolor.  Si siente dolor al Land O'Lakes que exigen sentarse, inclinarse, agacharse o ponerse en cuclillas, acustese en una posicin en la que el cuerpo no deba doblarse mucho. Por ejemplo, evite acurrucarse de costado con  los brazos y las rodillas cerca del  pecho (posicin fetal).  Si siente dolor con las actividades que exigen estar de pie durante mucho tiempo o Training and development officer los brazos, acustese con la columna en una posicin neutral y flexione ligeramente las rodillas. Pruebe con las siguientes posiciones: ? Acupuncturist de costado con una almohada entre las rodillas. ? Acostarse boca arriba con una almohada debajo de las rodillas. Levantar objetos   Cuando tenga que levantar un objeto, mantenga los pies separados el ancho de los hombros, como [place], y apriete los msculos abdominales.  [place] y la cadera, y Quarry manager la columna en posicin neutral. Es importante levantarse utilizando la fuerza de las piernas, no de la espalda. No trabe las rodillas hacia afuera.  Siempre pida ayuda a otra persona para levantar objetos pesados o incmodos. Esta informacin no tiene Marine scientist el consejo del mdico. Asegrese de hacerle al mdico cualquier pregunta que tenga. Document Revised: 11/28/2018 Document Reviewed: 11/28/2018 Elsevier Patient Education  [place].

## 2020-06-30 NOTE — Progress Notes (Signed)
LOW-RISK PREGNANCY VISIT Patient name: Colleen Arnold MRN [id number]  Date of birth: 04/07/1986 Chief Complaint:   Routine Prenatal Visit (left leg  tingle and goes sleep when walk a lot)  History of Present Illness:   Colleen Arnold is a 34 y.o. G34P1001 female at [redacted]w[redacted]d with an Estimated Date of Delivery: 10/25/20 being seen today for ongoing management of a low-risk pregnancy.  Depression screen [facility] 2/9 04/27/2020  Decreased Interest 0  Down, Depressed, Hopeless 0  PHQ - 2 Score 0  Altered sleeping 1  Tired, decreased energy 1  Change in appetite 0  Feeling bad or failure about yourself  0  Trouble concentrating 0  Moving slowly or fidgety/restless 0  Suicidal thoughts 0  PHQ-9 Score 2    Today she reports Lt sciatica. Contractions: Irregular.  .  Movement: Present. denies leaking of fluid. Review of Systems:   Pertinent items are noted in HPI Denies abnormal vaginal discharge w/ itching/odor/irritation, headaches, visual changes, shortness of breath, chest pain, abdominal pain, severe nausea/vomiting, or problems with urination or bowel movements unless otherwise stated above. Pertinent History Reviewed:  Reviewed past medical,surgical, social, obstetrical and family history.  Reviewed problem list, medications and allergies. Physical Assessment:   Vitals:   06/30/20 1423  BP: 124/68  Pulse: 92  Weight: 267 lb (121.1 kg)  Body mass index is 48.06 kg/m.        Physical Examination:   General appearance: Well appearing, and in no distress  Mental status: Alert, oriented to person, place, and time  Skin: Warm & dry  Cardiovascular: Normal heart rate noted  Respiratory: Normal respiratory effort, no distress  Abdomen: Soft, gravid, nontender  Pelvic: Cervical exam deferred         Extremities: Edema: Trace  Fetal Status: Fetal Heart Rate (bpm): 134 Fundal Height: 24 cm Movement: Present    Chaperone: n/a    Results for orders placed or performed in visit on 06/30/20  (from the past 24 hour(s))  POC Urinalysis Dipstick OB   Collection Time: 06/30/20  2:29 PM  Result Value Ref Range   Color, UA     Clarity, UA     Glucose, UA Negative Negative   Bilirubin, UA     Ketones, UA neg    Spec Grav, UA     Blood, UA 3    pH, UA     POC,PROTEIN,UA Negative Negative, Trace, Small (1+), Moderate (2+), Large (3+), 4+   Urobilinogen, UA     Nitrite, UA neg    Leukocytes, UA Negative Negative   Appearance     Odor      Assessment & Plan:  1) Low-risk pregnancy G2P1001 at [redacted]w[redacted]d with an Estimated Date of Delivery: 10/25/20   2) Lt sciatica, printed info/exercises given  3) Hematuria> no uti sx, send urine cx  4) H/O GDM> early A1C 5.7, pn2 next visit   Meds: No orders of the defined types were placed in this encounter.  Labs/procedures today: urine cx  Plan:  Continue routine obstetrical care  Next visit: prefers will be in person for pn2    Reviewed: Preterm labor symptoms and general obstetric precautions including but not limited to vaginal bleeding, contractions, leaking of fluid and fetal movement were reviewed in detail with the patient.  All questions were answered.   Follow-up: Return in about 4 weeks (around 07/28/2020) for LROB, PN2, in person, CNM.  Orders Placed This Encounter  Procedures  . Urine Culture  . POC Urinalysis  Dipstick OB   [place], [facility] 06/30/2020 3:09 PM

## 2020-07-03 LAB — URINE CULTURE

## 2020-07-08 ENCOUNTER — Telehealth: Payer: Self-pay | Admitting: Women's Health

## 2020-07-08 NOTE — Telephone Encounter (Signed)
Pateint called stating that she came into the office on 06/30/2020 for her OB visit and was told that she had blood in her Urine, pt states that she was told that her urine was going to be sent out to be check out  and hasn't heard back. Pt would like to also know what can cause blood in her Urine. Please contact pt

## 2020-07-08 NOTE — Telephone Encounter (Signed)
Pt informed that culture is negative.

## 2020-07-27 ENCOUNTER — Encounter: Payer: Self-pay | Admitting: Obstetrics & Gynecology

## 2020-07-27 ENCOUNTER — Other Ambulatory Visit: Payer: Self-pay

## 2020-07-27 ENCOUNTER — Ambulatory Visit (INDEPENDENT_AMBULATORY_CARE_PROVIDER_SITE_OTHER): Payer: Self-pay | Admitting: Obstetrics & Gynecology

## 2020-07-27 VITALS — BP 132/85 | HR 90 | Wt 272.5 lb

## 2020-07-27 DIAGNOSIS — Z3480 Encounter for supervision of other normal pregnancy, unspecified trimester: Secondary | ICD-10-CM

## 2020-07-27 DIAGNOSIS — Z1389 Encounter for screening for other disorder: Secondary | ICD-10-CM

## 2020-07-27 DIAGNOSIS — Z331 Pregnant state, incidental: Secondary | ICD-10-CM

## 2020-07-27 DIAGNOSIS — Z131 Encounter for screening for diabetes mellitus: Secondary | ICD-10-CM

## 2020-07-27 DIAGNOSIS — Z23 Encounter for immunization: Secondary | ICD-10-CM

## 2020-07-27 DIAGNOSIS — Z3A27 27 weeks gestation of pregnancy: Secondary | ICD-10-CM

## 2020-07-27 DIAGNOSIS — Z3A26 26 weeks gestation of pregnancy: Secondary | ICD-10-CM

## 2020-07-27 LAB — POCT URINALYSIS DIPSTICK OB
Glucose, UA: NEGATIVE
Ketones, UA: NEGATIVE
Leukocytes, UA: NEGATIVE
Nitrite, UA: NEGATIVE
POC,PROTEIN,UA: NEGATIVE

## 2020-07-27 NOTE — Progress Notes (Signed)
LOW-RISK PREGNANCY VISIT Patient name: Colleen Arnold MRN [id number]  Date of birth: August 29, 1986 Chief Complaint:   Routine Prenatal Visit (PN2 today; numbness in left arm and left leg)  History of Present Illness:   Colleen Arnold is a 34 y.o. G37P1001 female at [redacted]w[redacted]d with an Estimated Date of Delivery: 10/25/20 being seen today for ongoing management of a low-risk pregnancy.  Depression screen [facility] 2/9 07/27/2020 04/27/2020  Decreased Interest 0 0  Down, Depressed, Hopeless 0 0  PHQ - 2 Score 0 0  Altered sleeping 1 1  Tired, decreased energy 1 1  Change in appetite 0 0  Feeling bad or failure about yourself  0 0  Trouble concentrating 0 0  Moving slowly or fidgety/restless 0 0  Suicidal thoughts 0 0  PHQ-9 Score 2 2    Today she reports sciatica and carpal tunnel symtpoms. Contractions: Irregular. Vag. Bleeding: None.  Movement: Present. denies leaking of fluid. Review of Systems:   Pertinent items are noted in HPI Denies abnormal vaginal discharge w/ itching/odor/irritation, headaches, visual changes, shortness of breath, chest pain, abdominal pain, severe nausea/vomiting, or problems with urination or bowel movements unless otherwise stated above. Pertinent History Reviewed:  Reviewed past medical,surgical, social, obstetrical and family history.  Reviewed problem list, medications and allergies. Physical Assessment:   Vitals:   07/27/20 0927  BP: 132/85  Pulse: 90  Weight: 272 lb 8 oz (123.6 kg)  Body mass index is 49.05 kg/m.        Physical Examination:   General appearance: Well appearing, and in no distress  Mental status: Alert, oriented to person, place, and time  Skin: Warm & dry  Cardiovascular: Normal heart rate noted  Respiratory: Normal respiratory effort, no distress  Abdomen: Soft, gravid, nontender  Pelvic: Cervical exam deferred         Extremities: Edema: Trace  Fetal Status: Fetal Heart Rate (bpm): 148 Fundal Height: 30 cm Movement: Present     Chaperone: n/a    Results for orders placed or performed in visit on 07/27/20 (from the past 24 hour(s))  POC Urinalysis Dipstick OB   Collection Time: 07/27/20  9:23 AM  Result Value Ref Range   Color, UA     Clarity, UA     Glucose, UA Negative Negative   Bilirubin, UA     Ketones, UA neg    Spec Grav, UA     Blood, UA 3+    pH, UA     POC,PROTEIN,UA Negative Negative, Trace, Small (1+), Moderate (2+), Large (3+), 4+   Urobilinogen, UA     Nitrite, UA neg    Leukocytes, UA Negative Negative   Appearance     Odor      Assessment & Plan:  1) Low-risk pregnancy G2P1001 at [redacted]w[redacted]d with an Estimated Date of Delivery: 10/25/20   2) Right hand carpal tunnel/left sciatica, brace ordered   Meds: No orders of the defined types were placed in this encounter.  Labs/procedures today: PN2  Plan:  Continue routine obstetrical care  Next visit: prefers in person    Reviewed: Term labor symptoms and general obstetric precautions including but not limited to vaginal bleeding, contractions, leaking of fluid and fetal movement were reviewed in detail with the patient.  All questions were answered.  home bp cuff. Rx faxed to . Check bp weekly, let us know if >140/90.   Follow-up: Return in about 3 weeks (around 08/17/2020) for [place].  Orders Placed This Encounter  Procedures  .  Tdap vaccine greater than or equal to 7yo IM  . Flu Vaccine QUAD 36+ mos IM  . POC Urinalysis Dipstick OB   Florian Buff, MD 07/27/2020 10:50 AM

## 2020-07-28 ENCOUNTER — Telehealth: Payer: Self-pay | Admitting: Obstetrics & Gynecology

## 2020-07-28 LAB — CBC
Hematocrit: 35 % (ref 34.0–46.6)
Hemoglobin: 11.6 g/dL (ref 11.1–15.9)
MCH: 31.7 pg (ref 26.6–33.0)
MCHC: 33.1 g/dL (ref 31.5–35.7)
MCV: 96 fL (ref 79–97)
Platelets: 216 10*3/uL (ref 150–450)
RBC: 3.66 x10E6/uL — ABNORMAL LOW (ref 3.77–5.28)
RDW: 12.3 % (ref 11.7–15.4)
WBC: 7.5 10*3/uL (ref 3.4–10.8)

## 2020-07-28 LAB — GLUCOSE TOLERANCE, 2 HOURS W/ 1HR
Glucose, 1 hour: 185 mg/dL — ABNORMAL HIGH (ref 65–179)
Glucose, 2 hour: 116 mg/dL (ref 65–152)
Glucose, Fasting: 112 mg/dL — ABNORMAL HIGH (ref 65–91)

## 2020-07-28 LAB — HIV ANTIBODY (ROUTINE TESTING W REFLEX): HIV Screen 4th Generation wRfx: NONREACTIVE

## 2020-07-28 LAB — ANTIBODY SCREEN: Antibody Screen: NEGATIVE

## 2020-07-28 LAB — RPR: RPR Ser Ql: NONREACTIVE

## 2020-07-28 MED ORDER — METFORMIN HCL 500 MG PO TABS: 500.0000 mg | ORAL_TABLET | Freq: Every day | ORAL | 1 refills | Status: DC

## 2020-07-29 ENCOUNTER — Other Ambulatory Visit: Payer: Self-pay | Admitting: *Deleted

## 2020-07-29 DIAGNOSIS — O0992 Supervision of high risk pregnancy, unspecified, second trimester: Secondary | ICD-10-CM

## 2020-07-29 DIAGNOSIS — O24410 Gestational diabetes mellitus in pregnancy, diet controlled: Secondary | ICD-10-CM

## 2020-07-30 ENCOUNTER — Telehealth: Payer: Self-pay | Admitting: Women's Health

## 2020-07-30 DIAGNOSIS — Z8632 Personal history of gestational diabetes: Secondary | ICD-10-CM | POA: Insufficient documentation

## 2020-07-30 DIAGNOSIS — O24419 Gestational diabetes mellitus in pregnancy, unspecified control: Secondary | ICD-10-CM | POA: Insufficient documentation

## 2020-07-30 MED ORDER — ACCU-CHEK SOFTCLIX LANCETS MISC: 12 refills | Status: DC

## 2020-07-30 MED ORDER — ACCU-CHEK GUIDE ME W/DEVICE KIT: 1.0000 | PACK | Freq: Four times a day (QID) | 0 refills | Status: DC

## 2020-07-30 MED ORDER — ACCU-CHEK GUIDE VI STRP: ORAL_STRIP | 12 refills | Status: DC

## 2020-07-30 NOTE — Telephone Encounter (Signed)
Daisy to contact pt letting her know sugar test was elevated. Med was sent to pharmacy. She should be getting a call from nutritionist. If she hasn't heard from nutritionist in 1 week, let us know. [place]

## 2020-07-30 NOTE — Telephone Encounter (Signed)
Patient called stating that she would liek to know the results of her blood work, pt states that yesterday she got a message stating that she had medication to pick up at the pharmacy. Pt states she went to go get them and they are for Sugar. Please contact pt

## 2020-08-04 ENCOUNTER — Telehealth: Payer: Self-pay | Admitting: Women's Health

## 2020-08-04 ENCOUNTER — Other Ambulatory Visit: Payer: Self-pay

## 2020-08-04 ENCOUNTER — Encounter: Payer: Self-pay | Admitting: Dietician

## 2020-08-04 ENCOUNTER — Encounter: Payer: Self-pay | Attending: Maternal & Fetal Medicine | Admitting: Dietician

## 2020-08-04 DIAGNOSIS — O24410 Gestational diabetes mellitus in pregnancy, diet controlled: Secondary | ICD-10-CM | POA: Insufficient documentation

## 2020-08-04 NOTE — Telephone Encounter (Signed)
Patient states that she does not have lancets and would like them sent to her pharmacy. [facility] is patient pharmacy.

## 2020-08-04 NOTE — Progress Notes (Signed)
Patient is here with her husband.   Timothy Platt from [facility] (interpretor). She was referred for GDM.  She had GDM with her first daughter. A1C 5.7% 04/27/2020.  This is concerning for prediabetes but should be rechecked after she delivers.  Patient lives with her husband and daughter.  Her daughter is 33 yo and has prediabetes. Diet is usually 2 meals per day and 1 snack.   Patient was seen on 08/04/2020 for Gestational Diabetes one on one appointment [facility]. The following learning objectives were met by the patient during this session:   States the definition of Gestational Diabetes  States why dietary management is important in controlling blood glucose  Describes the effects each nutrient has on blood glucose levels  Demonstrates ability to create a balanced meal plan  Demonstrates carbohydrate counting   States when to check blood glucose levels  Demonstrates proper blood glucose monitoring techniques  States the effect of stress and exercise on blood glucose levels  Blood glucose monitor given: Accu-Chek Guide Met Lot # [id number] Exp: 08-05-2021 Blood glucose reading: 138 (2 hours after sausage, egg biscuit and OJ)  Patient instructed to monitor glucose levels: FBS: 60 - <90 1 hour: <140 2 hour: <120  *Patient received handouts:  Nutrition Diabetes and Pregnancy in Spanish  Spanish My Plate  Meal Plan Card in Spanish  Patient will be seen for follow-up as needed.      

## 2020-08-17 ENCOUNTER — Encounter: Payer: Self-pay | Admitting: Women's Health

## 2020-08-17 ENCOUNTER — Other Ambulatory Visit (HOSPITAL_COMMUNITY)
Admission: RE | Admit: 2020-08-17 | Discharge: 2020-08-17 | Disposition: A | Discharge status: Home / Self Care | Payer: Self-pay | Source: Ambulatory Visit | Attending: Obstetrics & Gynecology | Admitting: Obstetrics & Gynecology

## 2020-08-17 ENCOUNTER — Ambulatory Visit (INDEPENDENT_AMBULATORY_CARE_PROVIDER_SITE_OTHER): Payer: Self-pay | Admitting: Women's Health

## 2020-08-17 ENCOUNTER — Telehealth: Payer: Self-pay | Admitting: Obstetrics & Gynecology

## 2020-08-17 VITALS — BP 133/73 | HR 98 | Wt 270.8 lb

## 2020-08-17 DIAGNOSIS — O26893 Other specified pregnancy related conditions, third trimester: Secondary | ICD-10-CM | POA: Insufficient documentation

## 2020-08-17 DIAGNOSIS — Z3A30 30 weeks gestation of pregnancy: Secondary | ICD-10-CM

## 2020-08-17 DIAGNOSIS — N898 Other specified noninflammatory disorders of vagina: Secondary | ICD-10-CM

## 2020-08-17 DIAGNOSIS — O0993 Supervision of high risk pregnancy, unspecified, third trimester: Secondary | ICD-10-CM | POA: Insufficient documentation

## 2020-08-17 DIAGNOSIS — O24419 Gestational diabetes mellitus in pregnancy, unspecified control: Secondary | ICD-10-CM

## 2020-08-17 DIAGNOSIS — Z1389 Encounter for screening for other disorder: Secondary | ICD-10-CM

## 2020-08-17 DIAGNOSIS — Z331 Pregnant state, incidental: Secondary | ICD-10-CM

## 2020-08-17 DIAGNOSIS — R109 Unspecified abdominal pain: Secondary | ICD-10-CM

## 2020-08-17 LAB — POCT URINALYSIS DIPSTICK OB
Glucose, UA: NEGATIVE
Leukocytes, UA: NEGATIVE
Nitrite, UA: NEGATIVE
POC,PROTEIN,UA: NEGATIVE

## 2020-08-17 MED ORDER — METFORMIN HCL 500 MG PO TABS: 500.0000 mg | ORAL_TABLET | Freq: Two times a day (BID) | ORAL | 3 refills | Status: DC

## 2020-08-17 NOTE — Telephone Encounter (Signed)
error 

## 2020-08-17 NOTE — Progress Notes (Signed)
HIGH-RISK PREGNANCY VISIT Patient name: Colleen Arnold MRN [id number]  Date of birth: [birth year] Chief Complaint:   Routine Prenatal Visit  History of Present Illness:   Colleen Arnold is a 34 y.o. G95P1001 female at [redacted]w[redacted]d with an Estimated Date of Delivery: 10/25/20 being seen today for ongoing management of a high-risk pregnancy complicated by diabetes mellitus A2DM currently on metformin 500mg  qhs.  Today she reports went to [facility] 10d ago for Rt lower back/flank pain, was told she may have a kidney stone and to f/u w/ [place]. Pain comes and goes. Also reports white odorous d/c, no itching/irritation. FBS 104-118, 2hr pp 84-141. Has been taking the metformin in the morning instead of at night.  Depression screen [facility] 2/9 08/04/2020 07/27/2020 04/27/2020  Decreased Interest 0 0 0  Down, Depressed, Hopeless 0 0 0  PHQ - 2 Score 0 0 0  Altered sleeping - 1 1  Tired, decreased energy - 1 1  Change in appetite - 0 0  Feeling bad or failure about yourself  - 0 0  Trouble concentrating - 0 0  Moving slowly or fidgety/restless - 0 0  Suicidal thoughts - 0 0  PHQ-9 Score - 2 2    Contractions: Irregular. Vag. Bleeding: None.  Movement: Present. denies leaking of fluid.  Review of Systems:   Pertinent items are noted in HPI Denies abnormal vaginal discharge w/ itching/odor/irritation, headaches, visual changes, shortness of breath, chest pain, abdominal pain, severe nausea/vomiting, or problems with urination or bowel movements unless otherwise stated above. Pertinent History Reviewed:  Reviewed past medical,surgical, social, obstetrical and family history.  Reviewed problem list, medications and allergies. Physical Assessment:   Vitals:   08/17/20 0919  BP: 133/73  Pulse: 98  Weight: 270 lb 12.8 oz (122.8 kg)  Body mass index is 48.74 kg/m.           Physical Examination:   General appearance: alert, well appearing, and in no distress  Mental status: alert, oriented to person, place, and  time  Skin: warm & dry   Extremities: Edema: Trace    Cardiovascular: normal heart rate noted  Respiratory: normal respiratory effort, no distress  Abdomen: gravid, soft, non-tender  Pelvic: spec exam: cx visually closed, small amt thin white d/c         Fetal Status: Fetal Heart Rate (bpm): 139 Fundal Height: 27 cm Movement: Present    Fetal Surveillance Testing today: doppler   Chaperone: [name] & Glenard Haring   Results for orders placed or performed in visit on 08/17/20 (from the past 24 hour(s))  POC Urinalysis Dipstick OB   Collection Time: 08/17/20  9:17 AM  Result Value Ref Range   Color, UA     Clarity, UA     Glucose, UA Negative Negative   Bilirubin, UA     Ketones, UA small    Spec Grav, UA     Blood, UA large    pH, UA     POC,PROTEIN,UA Negative Negative, Trace, Small (1+), Moderate (2+), Large (3+), 4+   Urobilinogen, UA     Nitrite, UA neg    Leukocytes, UA Negative Negative   Appearance     Odor      Assessment & Plan:  1) High-risk pregnancy G2P1001 at [redacted]w[redacted]d with an Estimated Date of Delivery: 10/25/20   2) A2DM, unstable, rx metformin 500mg  BID, f/u 1wk  3) Prev c/s, wants TOLAC  4) Possible Rt kidney stone> will get renal u/s, push po water intake  5) Vaginal d/c w/ odor> CV swab sent  Meds:  Meds ordered this encounter  Medications   metFORMIN (GLUCOPHAGE) 500 MG tablet    Sig: Take 1 tablet (500 mg total) by mouth 2 (two) times daily with a meal.    Dispense:  60 tablet    Refill:  3    Order Specific Question:   Supervising Provider    Answer:   Florian Buff [[id number]]    Labs/procedures today: spec exam w/ CV swab  Treatment Plan:  Growth u/s q4wks     2x/wk testing @ 32wks or weekly BPP    Deliver @ 39wks:______   Reviewed: Preterm labor symptoms and general obstetric precautions including but not limited to vaginal bleeding, contractions, leaking of fluid and fetal movement were reviewed in detail with the patient.  All questions were  answered. Has home bp cuff.  Check bp weekly, let us know if >140/90.   Follow-up: Return in about 1 week (around 08/24/2020) for [place], in person, MD or CNM; then 2wks from now EFW/BPP u/s w/ HROB w/ MD.  Orders Placed This Encounter  Procedures   US RENAL   POC Urinalysis Dipstick OB   [place], [facility] 08/17/2020 10:44 AM

## 2020-08-17 NOTE — Patient Instructions (Signed)
Colleen Arnold, I greatly value your feedback.  If you receive a survey following your visit with [place] today, we appreciate you taking the time to fill it out.  Thanks, Colleen Arnold, CNM, WHNP-BC  [facility] [place] [facility] ([place], [place] [address]) Entrance C, located off of [address] parking   Go to [email or web address] to register for FREE online childbirth classes    Call the office [phone]) or go to [facility] if:  You begin to have strong, frequent contractions  Your water breaks.  Sometimes it is a big gush of fluid, sometimes it is just a trickle that keeps getting your panties wet or running down your legs  You have vaginal bleeding.  It is normal to have a small amount of spotting if your cervix was checked.   You don't feel your baby moving like normal.  If you don't, get you something to eat and drink and lay down and focus on feeling your baby move.  You should feel at least 10 movements in 2 hours.  If you don't, you should call the office or go to [facility].   Call the office [phone]) or go to [facility] hospital for these signs of pre-eclampsia:  Severe headache that does not go away with Tylenol  Visual changes- seeing spots, double, blurred vision  Pain under your right breast or upper abdomen that does not go away with Tums or heartburn medicine  Nausea and/or vomiting  Severe swelling in your hands, feet, and face    Home Blood Pressure Monitoring for Patients   Your provider has recommended that you check your blood pressure (BP) at least once a week at home. If you do not have a blood pressure cuff at home, one will be provided for you. Contact your provider if you have not received your monitor within 1 week.   Helpful Tips for Accurate Home Blood Pressure Checks  . Don't smoke, exercise, or drink caffeine 30 minutes before checking your BP . Use the restroom before checking your BP (a full bladder  can raise your pressure) . Relax in a comfortable upright chair . Feet on the ground . Left arm resting comfortably on a flat surface at the level of your heart . Legs uncrossed . Back supported . Sit quietly and don't talk . Place the cuff on your bare arm . Adjust snuggly, so that only two fingertips can fit between your skin and the top of the cuff . Check 2 readings separated by at least one minute . Keep a log of your BP readings . For a visual, please reference this diagram: http://ccnc.care/bpdiagram  Provider Name: [facility]     Phone: [phone]  Zone 1: ALL CLEAR  Continue to monitor your symptoms:  . BP reading is less than 140 (top number) or less than 90 (bottom number)  . No right upper stomach pain . No headaches or seeing spots . No feeling nauseated or throwing up . No swelling in face and hands  Zone 2: CAUTION Call your doctor's office for any of the following:  . BP reading is greater than 140 (top number) or greater than 90 (bottom number)  . Stomach pain under your ribs in the middle or right side . Headaches or seeing spots . Feeling nauseated or throwing up . Swelling in face and hands  Zone 3: EMERGENCY  Seek immediate medical care if you have any of the  following:  . BP reading is greater than160 (top number) or greater than 110 (bottom number) . Severe headaches not improving with Tylenol . Serious difficulty catching your breath . Any worsening symptoms from Zone 2   Preterm Labor and Birth Information  The normal length of a pregnancy is 39-41 weeks. Preterm labor is when labor starts before 37 completed weeks of pregnancy. What are the risk factors for preterm labor? Preterm labor is more likely to occur in women who:  Have certain infections during pregnancy such as a bladder infection, sexually transmitted infection, or infection inside the uterus (chorioamnionitis).  Have a shorter-than-normal cervix.  Have gone into preterm  labor before.  Have had surgery on their cervix.  Are younger than age 71 or older than age 61.  Are African American.  Are pregnant with twins or multiple babies (multiple gestation).  Take street drugs or smoke while pregnant.  Do not gain enough weight while pregnant.  Became pregnant shortly after having been pregnant. What are the symptoms of preterm labor? Symptoms of preterm labor include:  Cramps similar to those that can happen during a menstrual period. The cramps may happen with diarrhea.  Pain in the abdomen or lower back.  Regular uterine contractions that may feel like tightening of the abdomen.  A feeling of increased pressure in the pelvis.  Increased watery or bloody mucus discharge from the vagina.  Water breaking (ruptured amniotic sac). Why is it important to recognize signs of preterm labor? It is important to recognize signs of preterm labor because babies who are born prematurely may not be fully developed. This can put them at an increased risk for:  Long-term (chronic) heart and lung problems.  Difficulty immediately after birth with regulating body systems, including blood sugar, body temperature, heart rate, and breathing rate.  Bleeding in the brain.  Cerebral palsy.  Learning difficulties.  Death. These risks are highest for babies who are born before 60 weeks of pregnancy. How is preterm labor treated? Treatment depends on the length of your pregnancy, your condition, and the health of your baby. It may involve:  Having a stitch (suture) placed in your cervix to prevent your cervix from opening too early (cerclage).  Taking or being given medicines, such as: ? Hormone medicines. These may be given early in pregnancy to help support the pregnancy. ? Medicine to stop contractions. ? Medicines to help mature the baby's lungs. These may be prescribed if the risk of delivery is high. ? Medicines to prevent your baby from developing  cerebral palsy. If the labor happens before 34 weeks of pregnancy, you may need to stay in the hospital. What should I do if I think I am in preterm labor? If you think that you are going into preterm labor, call your health care provider right away. How can I prevent preterm labor in future pregnancies? To increase your chance of having a full-term pregnancy:  Do not use any tobacco products, such as cigarettes, chewing tobacco, and e-cigarettes. If you need help quitting, ask your health care provider.  Do not use street drugs or medicines that have not been prescribed to you during your pregnancy.  Talk with your health care provider before taking any herbal supplements, even if you have been taking them regularly.  Make sure you gain a healthy amount of weight during your pregnancy.  Watch for infection. If you think that you might have an infection, get it checked right away.  Make sure to  tell your health care provider if you have gone into preterm labor before. This information is not intended to replace advice given to you by your health care provider. Make sure you discuss any questions you have with your health care provider. Document Revised: 01/25/2019 Document Reviewed: 02/24/2016 Elsevier Patient Education  [place].

## 2020-08-18 ENCOUNTER — Other Ambulatory Visit: Payer: Self-pay | Admitting: Women's Health

## 2020-08-18 LAB — CERVICOVAGINAL ANCILLARY ONLY
Bacterial Vaginitis (gardnerella): POSITIVE — AB
Candida Glabrata: NEGATIVE
Candida Vaginitis: NEGATIVE
Chlamydia: NEGATIVE
Comment: NEGATIVE
Comment: NEGATIVE
Comment: NEGATIVE
Comment: NEGATIVE
Comment: NEGATIVE
Comment: NORMAL
Neisseria Gonorrhea: NEGATIVE
Trichomonas: NEGATIVE

## 2020-08-18 MED ORDER — METRONIDAZOLE 500 MG PO TABS: 500.0000 mg | ORAL_TABLET | Freq: Two times a day (BID) | ORAL | 0 refills | Status: DC

## 2020-08-21 ENCOUNTER — Other Ambulatory Visit: Payer: Self-pay

## 2020-08-21 ENCOUNTER — Ambulatory Visit (HOSPITAL_COMMUNITY)
Admission: RE | Admit: 2020-08-21 | Discharge: 2020-08-21 | Disposition: A | Discharge status: Home / Self Care | Payer: Self-pay | Source: Ambulatory Visit | Attending: Women's Health | Admitting: Women's Health

## 2020-08-21 DIAGNOSIS — Z3A30 30 weeks gestation of pregnancy: Secondary | ICD-10-CM | POA: Insufficient documentation

## 2020-08-21 DIAGNOSIS — N1330 Unspecified hydronephrosis: Secondary | ICD-10-CM | POA: Insufficient documentation

## 2020-08-21 DIAGNOSIS — O0993 Supervision of high risk pregnancy, unspecified, third trimester: Secondary | ICD-10-CM | POA: Insufficient documentation

## 2020-08-21 DIAGNOSIS — R109 Unspecified abdominal pain: Secondary | ICD-10-CM | POA: Insufficient documentation

## 2020-08-21 DIAGNOSIS — O26833 Pregnancy related renal disease, third trimester: Secondary | ICD-10-CM | POA: Insufficient documentation

## 2020-08-24 ENCOUNTER — Ambulatory Visit (INDEPENDENT_AMBULATORY_CARE_PROVIDER_SITE_OTHER): Payer: Self-pay | Admitting: Advanced Practice Midwife

## 2020-08-24 VITALS — BP 139/74 | HR 89 | Wt 273.0 lb

## 2020-08-24 DIAGNOSIS — O0993 Supervision of high risk pregnancy, unspecified, third trimester: Secondary | ICD-10-CM

## 2020-08-24 DIAGNOSIS — Z3A31 31 weeks gestation of pregnancy: Secondary | ICD-10-CM

## 2020-08-24 DIAGNOSIS — O24419 Gestational diabetes mellitus in pregnancy, unspecified control: Secondary | ICD-10-CM

## 2020-08-24 DIAGNOSIS — Z1389 Encounter for screening for other disorder: Secondary | ICD-10-CM

## 2020-08-24 DIAGNOSIS — Z331 Pregnant state, incidental: Secondary | ICD-10-CM

## 2020-08-24 LAB — POCT URINALYSIS DIPSTICK OB
Glucose, UA: NEGATIVE
Leukocytes, UA: NEGATIVE
Nitrite, UA: NEGATIVE
POC,PROTEIN,UA: NEGATIVE

## 2020-08-24 NOTE — Patient Instructions (Signed)
Andreas Blower, I greatly value your feedback.  If you receive a survey following your visit with [place] today, we appreciate you taking the time to fill it out.  Thanks, Derrill Memo CNM   [place] at [facility] ([place], [place] [address]) Entrance C, located off of [employer] parking  Go to [email or web address] to register for FREE online childbirth classes   Call the office [phone]) or go to [facility] if:  You begin to have strong, frequent contractions  Your water breaks.  Sometimes it is a big gush of fluid, sometimes it is just a trickle that keeps getting your panties wet or running down your legs  You have vaginal bleeding.  It is normal to have a small amount of spotting if your cervix was checked.   You don't feel your baby moving like normal.  If you don't, get you something to eat and drink and lay down and focus on feeling your baby move.  You should feel at least 10 movements in 2 hours.  If you don't, you should call the office or go to [facility].    Tdap Vaccine  It is recommended that you get the Tdap vaccine during the third trimester of EACH pregnancy to help protect your baby from getting pertussis (whooping cough)  27-36 weeks is the BEST time to do this so that you can pass the protection on to your baby. During pregnancy is better than after pregnancy, but if you are unable to get it during pregnancy it will be offered at the hospital.   You can get this vaccine with [place], at the health department, your family doctor, or some local pharmacies  Everyone who will be around your baby should also be up-to-date on their vaccines before the baby comes. Adults (who are not pregnant) only need 1 dose of Tdap during adulthood.   [place] Pediatricians/Family Doctors:  [facility] [phone]                 [place] [phone]  (usually not accepting new patients unless you have family there already, you are always welcome to call and ask)       [facility] Department [phone]       [facility] Pediatricians/Family Doctors:   [facility]: [phone]  [facility]: [phone]  [facility]: [place] Doctors:   [facility]: [facility]: [place]:  [place]: [phone]   Home Blood Pressure Monitoring for Patients   Your provider has recommended that you check your blood pressure (BP) at least once a week at home. If you do not have a blood pressure cuff at home, one will be provided for you. Contact your provider if you have not received your monitor within 1 week.   Helpful Tips for Accurate Home Blood Pressure Checks  . Don't smoke, exercise, or drink caffeine 30 minutes before checking your BP . Use the restroom before checking your BP (a full bladder can raise your pressure) . Relax in a comfortable upright chair . Feet on the ground . Left arm resting comfortably on a flat surface at the level of your heart . Legs uncrossed . Back supported . Sit quietly and don't talk . Place the cuff on your bare arm .  Adjust snuggly, so that only two fingertips can fit between your skin and the top of the cuff . Check 2 readings separated by at least one minute . Keep a log of your BP readings . For a visual, please reference this diagram: http://ccnc.care/bpdiagram  Provider Name: [facility]     Phone: [phone]  Zone 1: ALL CLEAR  Continue to monitor your symptoms:  . BP reading is less than 140 (top number) or less than 90 (bottom number)  . No right upper stomach pain . No headaches or seeing spots . No feeling nauseated or throwing up . No swelling in face and hands  Zone 2: CAUTION Call your doctor's office for  any of the following:  . BP reading is greater than 140 (top number) or greater than 90 (bottom number)  . Stomach pain under your ribs in the middle or right side . Headaches or seeing spots . Feeling nauseated or throwing up . Swelling in face and hands  Zone 3: EMERGENCY  Seek immediate medical care if you have any of the following:  . BP reading is greater than160 (top number) or greater than 110 (bottom number) . Severe headaches not improving with Tylenol . Serious difficulty catching your breath . Any worsening symptoms from Zone 2   Third Trimester of Pregnancy The third trimester is from week 29 through week 42, months 7 through 9. The third trimester is a time when the fetus is growing rapidly. At the end of the ninth month, the fetus is about 20 inches in length and weighs 6-10 pounds.  BODY CHANGES Your body goes through many changes during pregnancy. The changes vary from woman to woman.   Your weight will continue to increase. You can expect to gain 25-35 pounds (11-16 kg) by the end of the pregnancy.  You may begin to get stretch marks on your hips, abdomen, and breasts.  You may urinate more often because the fetus is moving lower into your pelvis and pressing on your bladder.  You may develop or continue to have heartburn as a result of your pregnancy.  You may develop constipation because certain hormones are causing the muscles that push waste through your intestines to slow down.  You may develop hemorrhoids or swollen, bulging veins (varicose veins).  You may have pelvic pain because of the weight gain and pregnancy hormones relaxing your joints between the bones in your pelvis. Backaches may result from overexertion of the muscles supporting your posture.  You may have changes in your hair. These can include thickening of your hair, rapid growth, and changes in texture. Some women also have hair loss during or after pregnancy, or hair that feels dry or thin.  Your hair will most likely return to normal after your baby is born.  Your breasts will continue to grow and be tender. A yellow discharge may leak from your breasts called colostrum.  Your belly button may stick out.  You may feel short of breath because of your expanding uterus.  You may notice the fetus "dropping," or moving lower in your abdomen.  You may have a bloody mucus discharge. This usually occurs a few days to a week before labor begins.  Your cervix becomes thin and soft (effaced) near your due date. WHAT TO EXPECT AT YOUR PRENATAL EXAMS  You will have prenatal exams every 2 weeks until week 36. Then, you will have weekly prenatal exams. During a routine prenatal visit:  You will be  weighed to make sure you and the fetus are growing normally.  Your blood pressure is taken.  Your abdomen will be measured to track your baby's growth.  The fetal heartbeat will be listened to.  Any test results from the previous visit will be discussed.  You may have a cervical check near your due date to see if you have effaced. At around 36 weeks, your caregiver will check your cervix. At the same time, your caregiver will also perform a test on the secretions of the vaginal tissue. This test is to determine if a type of bacteria, Group B streptococcus, is present. Your caregiver will explain this further. Your caregiver may ask you:  What your birth plan is.  How you are feeling.  If you are feeling the baby move.  If you have had any abnormal symptoms, such as leaking fluid, bleeding, severe headaches, or abdominal cramping.  If you have any questions. Other tests or screenings that may be performed during your third trimester include:  Blood tests that check for low iron levels (anemia).  Fetal testing to check the health, activity level, and growth of the fetus. Testing is done if you have certain medical conditions or if there are problems during the pregnancy. FALSE  LABOR You may feel small, irregular contractions that eventually go away. These are called Braxton Hicks contractions, or false labor. Contractions may last for hours, days, or even weeks before true labor sets in. If contractions come at regular intervals, intensify, or become painful, it is best to be seen by your caregiver.  SIGNS OF LABOR   Menstrual-like cramps.  Contractions that are 5 minutes apart or less.  Contractions that start on the top of the uterus and spread down to the lower abdomen and back.  A sense of increased pelvic pressure or back pain.  A watery or bloody mucus discharge that comes from the vagina. If you have any of these signs before the 37th week of pregnancy, call your caregiver right away. You need to go to the hospital to get checked immediately. HOME CARE INSTRUCTIONS   Avoid all smoking, herbs, alcohol, and unprescribed drugs. These chemicals affect the formation and growth of the baby.  Follow your caregiver's instructions regarding medicine use. There are medicines that are either safe or unsafe to take during pregnancy.  Exercise only as directed by your caregiver. Experiencing uterine cramps is a good sign to stop exercising.  Continue to eat regular, healthy meals.  Wear a good support bra for breast tenderness.  Do not use hot tubs, steam rooms, or saunas.  Wear your seat belt at all times when driving.  Avoid raw meat, uncooked cheese, cat litter boxes, and soil used by cats. These carry germs that can cause birth defects in the baby.  Take your prenatal vitamins.  Try taking a stool softener (if your caregiver approves) if you develop constipation. Eat more high-fiber foods, such as fresh vegetables or fruit and whole grains. Drink plenty of fluids to keep your urine clear or pale yellow.  Take warm sitz baths to soothe any pain or discomfort caused by hemorrhoids. Use hemorrhoid cream if your caregiver approves.  If you develop varicose  veins, wear support hose. Elevate your feet for 15 minutes, 3-4 times a day. Limit salt in your diet.  Avoid heavy lifting, wear low heal shoes, and practice good posture.  Rest a lot with your legs elevated if you have leg cramps or low back pain.  Visit your dentist if you have not gone during your pregnancy. Use a soft toothbrush to brush your teeth and be gentle when you floss.  A sexual relationship may be continued unless your caregiver directs you otherwise.  Do not travel far distances unless it is absolutely necessary and only with the approval of your caregiver.  Take prenatal classes to understand, practice, and ask questions about the labor and delivery.  Make a trial run to the hospital.  Pack your hospital bag.  Prepare the baby's nursery.  Continue to go to all your prenatal visits as directed by your caregiver. SEEK MEDICAL CARE IF:  You are unsure if you are in labor or if your water has broken.  You have dizziness.  You have mild pelvic cramps, pelvic pressure, or nagging pain in your abdominal area.  You have persistent nausea, vomiting, or diarrhea.  You have a bad smelling vaginal discharge.  You have pain with urination. SEEK IMMEDIATE MEDICAL CARE IF:   You have a fever.  You are leaking fluid from your vagina.  You have spotting or bleeding from your vagina.  You have severe abdominal cramping or pain.  You have rapid weight loss or gain.  You have shortness of breath with chest pain.  You notice sudden or extreme swelling of your face, hands, ankles, feet, or legs.  You have not felt your baby move in over an hour.  You have severe headaches that do not go away with medicine.  You have vision changes. Document Released: 09/27/2001 Document Revised: 10/08/2013 Document Reviewed: 12/04/2012 [facility] Patient Information 2015 [place], [place]. This information is not intended to replace advice given to you by your health care provider. Make  sure you discuss any questions you have with your health care provider.

## 2020-08-24 NOTE — Progress Notes (Signed)
HIGH-RISK PREGNANCY VISIT Patient name: Colleen Arnold MRN [id number]  Date of birth: 1986-04-26 Chief Complaint:   Routine Prenatal Visit  History of Present Illness:   Colleen Arnold is a 34 y.o. G27P1001 female at [redacted]w[redacted]d with an Estimated Date of Delivery: 10/25/20 being seen today for ongoing management of a high-risk pregnancy complicated by diabetes mellitus A2DM currently on metformin 500mg  bid.  Today she reports doing well overall; when she tries to walk for exercise she feels low abd pressure and some L leg numbness.   Fasting CBGs: 100-119 2hr PP: one 130 and 143, all others <120 over the past week  Depression screen [facility] 2/9 08/04/2020 07/27/2020 04/27/2020  Decreased Interest 0 0 0  Down, Depressed, Hopeless 0 0 0  PHQ - 2 Score 0 0 0  Altered sleeping - 1 1  Tired, decreased energy - 1 1  Change in appetite - 0 0  Feeling bad or failure about yourself  - 0 0  Trouble concentrating - 0 0  Moving slowly or fidgety/restless - 0 0  Suicidal thoughts - 0 0  PHQ-9 Score - 2 2    Contractions: Not present. Vag. Bleeding: None.  Movement: Present. denies leaking of fluid.  Review of Systems:   Pertinent items are noted in HPI Denies abnormal vaginal discharge w/ itching/odor/irritation, headaches, visual changes, shortness of breath, chest pain, abdominal pain, severe nausea/vomiting, or problems with urination or bowel movements unless otherwise stated above. Pertinent History Reviewed:  Reviewed past medical,surgical, social, obstetrical and family history.  Reviewed problem list, medications and allergies. Physical Assessment:   Vitals:   08/24/20 0852  BP: 139/74  Pulse: 89  Weight: 273 lb (123.8 kg)  Body mass index is 49.14 kg/m.           Physical Examination:   General appearance: alert, well appearing, and in no distress  Mental status: alert, oriented to person, place, and time  Skin: warm & dry   Extremities: Edema: Trace    Cardiovascular: normal heart rate  noted  Respiratory: normal respiratory effort, no distress  Abdomen: gravid, soft, non-tender  Pelvic: Cervical exam deferred         Fetal Status: Fetal Heart Rate (bpm): 138 Fundal Height: 34 cm Movement: Present     Results for orders placed or performed in visit on 08/24/20 (from the past 24 hour(s))  POC Urinalysis Dipstick OB   Collection Time: 08/24/20  9:01 AM  Result Value Ref Range   Color, UA     Clarity, UA     Glucose, UA Negative Negative   Bilirubin, UA     Ketones, UA moderate    Spec Grav, UA     Blood, UA trace    pH, UA     POC,PROTEIN,UA Negative Negative, Trace, Small (1+), Moderate (2+), Large (3+), 4+   Urobilinogen, UA     Nitrite, UA neg    Leukocytes, UA Negative Negative   Appearance     Odor      Assessment & Plan:  1) High-risk pregnancy G2P1001 at [redacted]w[redacted]d with an Estimated Date of Delivery: 10/25/20   2) GDMA2, due to elevated fastings will increase PM dosing of metformin to 1000mg ; has growth scan/BPP already scheduled Nov 18th, then will start ante testing the week after; encouraged to walk for exercise even 15 mins qd  3) Previous C/S, desires TOLAC; will attempt to get previous C/S records from [facility] before her next visit so she can sign the TOLAC  consent w Dr Elonda Husky  Meds: No orders of the defined types were placed in this encounter.   Labs/procedures today: none  Treatment Plan:  Growth scan & BPP next week to start ante testing, then will schedule out biweekly NSTs  Reviewed: Preterm labor symptoms and general obstetric precautions including but not limited to vaginal bleeding, contractions, leaking of fluid and fetal movement were reviewed in detail with the patient.  All questions were answered. Has home bp cuff. Check bp weekly, let us know if >140/90.   Follow-up: Return for starting week of 11/22 schedule out twice weekly NSTs with HROB visits (keep visit/scan on 11/18).  Orders Placed This Encounter  Procedures  . POC Urinalysis  Dipstick OB   Myrtis Ser CNM 08/24/2020 9:30 AM

## 2020-09-02 ENCOUNTER — Other Ambulatory Visit: Payer: Self-pay | Admitting: Advanced Practice Midwife

## 2020-09-02 DIAGNOSIS — O24419 Gestational diabetes mellitus in pregnancy, unspecified control: Secondary | ICD-10-CM

## 2020-09-03 ENCOUNTER — Ambulatory Visit (INDEPENDENT_AMBULATORY_CARE_PROVIDER_SITE_OTHER): Payer: Self-pay

## 2020-09-03 ENCOUNTER — Ambulatory Visit (INDEPENDENT_AMBULATORY_CARE_PROVIDER_SITE_OTHER): Payer: Self-pay | Admitting: Obstetrics & Gynecology

## 2020-09-03 ENCOUNTER — Encounter: Payer: Self-pay | Admitting: Obstetrics & Gynecology

## 2020-09-03 ENCOUNTER — Other Ambulatory Visit: Payer: Self-pay

## 2020-09-03 VITALS — BP 129/78 | HR 94 | Wt 269.0 lb

## 2020-09-03 DIAGNOSIS — O0993 Supervision of high risk pregnancy, unspecified, third trimester: Secondary | ICD-10-CM

## 2020-09-03 DIAGNOSIS — Z1389 Encounter for screening for other disorder: Secondary | ICD-10-CM

## 2020-09-03 DIAGNOSIS — O24419 Gestational diabetes mellitus in pregnancy, unspecified control: Secondary | ICD-10-CM

## 2020-09-03 DIAGNOSIS — Z3A32 32 weeks gestation of pregnancy: Secondary | ICD-10-CM

## 2020-09-03 DIAGNOSIS — Z98891 History of uterine scar from previous surgery: Secondary | ICD-10-CM

## 2020-09-03 DIAGNOSIS — Z331 Pregnant state, incidental: Secondary | ICD-10-CM

## 2020-09-03 LAB — POCT URINALYSIS DIPSTICK OB
Glucose, UA: NEGATIVE
Ketones, UA: NEGATIVE
Leukocytes, UA: NEGATIVE
Nitrite, UA: NEGATIVE
POC,PROTEIN,UA: NEGATIVE

## 2020-09-03 MED ORDER — GLYBURIDE 2.5 MG PO TABS: 2.5000 mg | ORAL_TABLET | Freq: Every day | ORAL | 3 refills | Status: DC

## 2020-09-03 NOTE — Progress Notes (Signed)
[place] 70+7 wks,cephalic,BPP 8/6,[id number] placenta gr 2,afi 13.9,fhr 144 bpm,EFW 2511 g 94%

## 2020-09-03 NOTE — Progress Notes (Signed)
HIGH-RISK PREGNANCY VISIT Patient name: Colleen Arnold MRN [id number]  Date of birth: [birth year] Chief Complaint:   Routine Prenatal Visit  History of Present Illness:   Colleen Arnold is a 34 y.o. G10P1001 female at [redacted]w[redacted]d with an Estimated Date of Delivery: 10/25/20 being seen today for ongoing management of a high-risk pregnancy complicated by diabetes mellitus A2DM currently on metformin.  Today she reports no complaints.  Depression screen [facility] 2/9 08/04/2020 07/27/2020 04/27/2020  Decreased Interest 0 0 0  Down, Depressed, Hopeless 0 0 0  PHQ - 2 Score 0 0 0  Altered sleeping - 1 1  Tired, decreased energy - 1 1  Change in appetite - 0 0  Feeling bad or failure about yourself  - 0 0  Trouble concentrating - 0 0  Moving slowly or fidgety/restless - 0 0  Suicidal thoughts - 0 0  PHQ-9 Score - 2 2    Contractions: Irregular. Vag. Bleeding: None.  Movement: Present. denies leaking of fluid.  Review of Systems:   Pertinent items are noted in HPI Denies abnormal vaginal discharge w/ itching/odor/irritation, headaches, visual changes, shortness of breath, chest pain, abdominal pain, severe nausea/vomiting, or problems with urination or bowel movements unless otherwise stated above. Pertinent History Reviewed:  Reviewed past medical,surgical, social, obstetrical and family history.  Reviewed problem list, medications and allergies. Physical Assessment:   Vitals:   09/03/20 1438  BP: 129/78  Pulse: 94  Weight: 269 lb (122 kg)  Body mass index is 48.42 kg/m.           Physical Examination:   General appearance: alert, well appearing, and in no distress  Mental status: alert, oriented to person, place, and time  Skin: warm & dry   Extremities: Edema: Trace    Cardiovascular: normal heart rate noted  Respiratory: normal respiratory effort, no distress  Abdomen: gravid, soft, non-tender  Pelvic: Cervical exam deferred         Fetal Status:     Movement: Present    Fetal  Surveillance Testing today: reactive NST   Chaperone: N/A    Results for orders placed or performed in visit on 09/03/20 (from the past 24 hour(s))  POC Urinalysis Dipstick OB   Collection Time: 09/03/20  2:13 PM  Result Value Ref Range   Color, UA     Clarity, UA     Glucose, UA Negative Negative   Bilirubin, UA     Ketones, UA neg    Spec Grav, UA     Blood, UA small    pH, UA     POC,PROTEIN,UA Negative Negative, Trace, Small (1+), Moderate (2+), Large (3+), 4+   Urobilinogen, UA     Nitrite, UA neg    Leukocytes, UA Negative Negative   Appearance     Odor      Assessment & Plan:  High-risk pregnancy: G2P1001 at [redacted]w[redacted]d with an Estimated Date of Delivery: 10/25/20   1) class A-II diabetes suboptimal control and maximize Metformin we will add glyburide at night,   2) reassuring fetal testing,   Meds:  Meds ordered this encounter  Medications  . glyBURIDE (DIABETA) 2.5 MG tablet    Sig: Take 1 tablet (2.5 mg total) by mouth at bedtime.    Dispense:  30 tablet    Refill:  3    Labs/procedures today:   Treatment Plan: Twice weekly fetal surveillance Reviewed: Preterm labor symptoms and general obstetric precautions including but not limited to vaginal bleeding, contractions, leaking of  fluid and fetal movement were reviewed in detail with the patient.  All questions were answered. has home bp cuff. Rx faxed to . Check bp weekly, let us know if >140/90.   Follow-up: Return for keep scheduled appt.   Future Appointments  Date Time Provider [place]  09/07/2020  2:50 PM Roma Schanz, [place] [facility]  09/14/2020 10:50 AM Florian Buff, [place] [facility]  09/21/2020 10:50 AM Florian Buff, MD [facility]    Orders Placed This Encounter  Procedures  . POC Urinalysis Dipstick OB   Florian Buff  09/03/2020 3:28 PM

## 2020-09-07 ENCOUNTER — Ambulatory Visit (INDEPENDENT_AMBULATORY_CARE_PROVIDER_SITE_OTHER): Payer: Self-pay | Admitting: Women's Health

## 2020-09-07 ENCOUNTER — Encounter: Payer: Self-pay | Admitting: Women's Health

## 2020-09-07 ENCOUNTER — Other Ambulatory Visit: Payer: Self-pay

## 2020-09-07 VITALS — BP 128/78 | HR 99 | Wt 274.8 lb

## 2020-09-07 DIAGNOSIS — Z3A33 33 weeks gestation of pregnancy: Secondary | ICD-10-CM

## 2020-09-07 DIAGNOSIS — O24419 Gestational diabetes mellitus in pregnancy, unspecified control: Secondary | ICD-10-CM

## 2020-09-07 DIAGNOSIS — O0993 Supervision of high risk pregnancy, unspecified, third trimester: Secondary | ICD-10-CM

## 2020-09-07 DIAGNOSIS — Z331 Pregnant state, incidental: Secondary | ICD-10-CM

## 2020-09-07 DIAGNOSIS — O0990 Supervision of high risk pregnancy, unspecified, unspecified trimester: Secondary | ICD-10-CM

## 2020-09-07 DIAGNOSIS — O26893 Other specified pregnancy related conditions, third trimester: Secondary | ICD-10-CM

## 2020-09-07 DIAGNOSIS — N898 Other specified noninflammatory disorders of vagina: Secondary | ICD-10-CM

## 2020-09-07 DIAGNOSIS — Z1389 Encounter for screening for other disorder: Secondary | ICD-10-CM

## 2020-09-07 LAB — POCT URINALYSIS DIPSTICK OB
Blood, UA: NEGATIVE
Glucose, UA: NEGATIVE
Ketones, UA: NEGATIVE
Leukocytes, UA: NEGATIVE
Nitrite, UA: NEGATIVE
POC,PROTEIN,UA: NEGATIVE

## 2020-09-07 LAB — POCT WET PREP (WET MOUNT)
Clue Cells Wet Prep Whiff POC: POSITIVE
Trichomonas Wet Prep HPF POC: ABSENT

## 2020-09-07 MED ORDER — METRONIDAZOLE 500 MG PO TABS: 500.0000 mg | ORAL_TABLET | Freq: Two times a day (BID) | ORAL | 0 refills | Status: DC

## 2020-09-07 NOTE — Progress Notes (Signed)
HIGH-RISK PREGNANCY VISIT Patient name: Colleen Arnold MRN [id number]  Date of birth: [birth year] Chief Complaint:   Routine Prenatal Visit (NST)  History of Present Illness:   Colleen Arnold is a 34 y.o. G60P1001 female at [redacted]w[redacted]d with an Estimated Date of Delivery: 10/25/20 being seen today for ongoing management of a high-risk pregnancy complicated by diabetes mellitus A2DM currently on metformin 500mg  AM/1,000mg PM, glyburide 2.5mg  qhs.   Today she reports all FBS <95, one 2hr pp >120 (133), leaking fluid occ x 1wk.  Depression screen [facility] 2/9 08/04/2020 07/27/2020 04/27/2020  Decreased Interest 0 0 0  Down, Depressed, Hopeless 0 0 0  PHQ - 2 Score 0 0 0  Altered sleeping - 1 1  Tired, decreased energy - 1 1  Change in appetite - 0 0  Feeling bad or failure about yourself  - 0 0  Trouble concentrating - 0 0  Moving slowly or fidgety/restless - 0 0  Suicidal thoughts - 0 0  PHQ-9 Score - 2 2    Contractions: Irritability. Vag. Bleeding: None.  Movement: Present. denies leaking of fluid.  Review of Systems:   Pertinent items are noted in HPI Denies abnormal vaginal discharge w/ itching/odor/irritation, headaches, visual changes, shortness of breath, chest pain, abdominal pain, severe nausea/vomiting, or problems with urination or bowel movements unless otherwise stated above. Pertinent History Reviewed:  Reviewed past medical,surgical, social, obstetrical and family history.  Reviewed problem list, medications and allergies. Physical Assessment:   Vitals:   09/07/20 1452  BP: 128/78  Pulse: 99  Weight: 274 lb 12.8 oz (124.6 kg)  Body mass index is 49.46 kg/m.           Physical Examination:   General appearance: alert, well appearing, and in no distress  Mental status: alert, oriented to person, place, and time  Skin: warm & dry   Extremities: Edema: Trace    Cardiovascular: normal heart rate noted  Respiratory: normal respiratory effort, no distress  Abdomen: gravid, soft,  non-tender  Pelvic: SSE: cx visually closed, no pooling, no change w/ valsalva. Fern & nitrazine neg. Wet prep +BV         Fetal Status: Fetal Heart Rate (bpm): 130   Movement: Present    Fetal Surveillance Testing today: NST: FHR baseline 130 bpm, Variability: moderate, Accelerations:present, Decelerations:  Absent= Cat 1/Reactive Toco: none     Chaperone: [name]    Results for orders placed or performed in visit on 09/07/20 (from the past 24 hour(s))  POC Urinalysis Dipstick OB   Collection Time: 09/07/20  2:58 PM  Result Value Ref Range   Color, UA     Clarity, UA     Glucose, UA Negative Negative   Bilirubin, UA     Ketones, UA neg    Spec Grav, UA     Blood, UA neg    pH, UA     POC,PROTEIN,UA Negative Negative, Trace, Small (1+), Moderate (2+), Large (3+), 4+   Urobilinogen, UA     Nitrite, UA neg    Leukocytes, UA Negative Negative   Appearance     Odor    POCT Wet Prep [facility])   Collection Time: 09/07/20  3:33 PM  Result Value Ref Range   Source Wet Prep POC vaginal    WBC, Wet Prep HPF POC few    Bacteria Wet Prep HPF POC Few Few   BACTERIA WET PREP MORPHOLOGY POC     Clue Cells Wet Prep HPF POC Few (A)  None   Clue Cells Wet Prep Whiff POC Positive Whiff    Yeast Wet Prep HPF POC None None   KOH Wet Prep POC     Trichomonas Wet Prep HPF POC Absent Absent    Assessment & Plan:  High-risk pregnancy: G2P1001 at [redacted]w[redacted]d with an Estimated Date of Delivery: 10/25/20   1) A2DM, stable on metformin 500mg AM/1,000mg  PM, glyburide 2.5mg  qhs, EFW 94% w/ AC 99% last week  2) Prev c/s, wants TOLAC if possible, plan EFW @ 36wks then discuss  3) BV> Rx metronidazole 500mg  BID x 7d for BV, no sex while taking   Meds:  Meds ordered this encounter  Medications  . metroNIDAZOLE (FLAGYL) 500 MG tablet    Sig: Take 1 tablet (500 mg total) by mouth 2 (two) times daily.    Dispense:  14 tablet    Refill:  0    Order Specific Question:   Supervising Provider     Answer:   Elonda Husky, LUTHER H [[id number]]    Labs/procedures today: nst, spec exam, fern, nitrazine, wet prep  Treatment Plan:  2x/wk nst, EFW 36wks, deliver @ 39wk  Reviewed: Preterm labor symptoms and general obstetric precautions including but not limited to vaginal bleeding, contractions, leaking of fluid and fetal movement were reviewed in detail with the patient.  All questions were answered.   Follow-up: Return for As scheduled.   Future Appointments  Date Time Provider [place]  09/14/2020 10:50 AM Florian Buff, MD [facility]  09/21/2020 10:50 AM Florian Buff, MD [facility]    Orders Placed This Encounter  Procedures  . POC Urinalysis Dipstick OB  . POCT Wet Prep [facility])   Roma Schanz CNM, [facility] 09/07/2020 3:40 PM

## 2020-09-07 NOTE — Patient Instructions (Signed)
Colleen Arnold, I greatly value your feedback.  If you receive a survey following your visit with [place] today, we appreciate you taking the time to fill it out.  Thanks, Knute Neu, CNM, WHNP-BC  [facility] [place] [facility] ([place], [place] [address]) Entrance C, located off of [address] parking   Go to [email or web address] to register for FREE online childbirth classes    Call the office [phone]) or go to [facility] if:  You begin to have strong, frequent contractions  Your water breaks.  Sometimes it is a big gush of fluid, sometimes it is just a trickle that keeps getting your panties wet or running down your legs  You have vaginal bleeding.  It is normal to have a small amount of spotting if your cervix was checked.   You don't feel your baby moving like normal.  If you don't, get you something to eat and drink and lay down and focus on feeling your baby move.  You should feel at least 10 movements in 2 hours.  If you don't, you should call the office or go to [facility].   Call the office [phone]) or go to [facility] hospital for these signs of pre-eclampsia:  Severe headache that does not go away with Tylenol  Visual changes- seeing spots, double, blurred vision  Pain under your right breast or upper abdomen that does not go away with Tums or heartburn medicine  Nausea and/or vomiting  Severe swelling in your hands, feet, and face    Home Blood Pressure Monitoring for Patients   Your provider has recommended that you check your blood pressure (BP) at least once a week at home. If you do not have a blood pressure cuff at home, one will be provided for you. Contact your provider if you have not received your monitor within 1 week.   Helpful Tips for Accurate Home Blood Pressure Checks  . Don't smoke, exercise, or drink caffeine 30 minutes before checking your BP . Use the restroom before checking your BP (a full bladder  can raise your pressure) . Relax in a comfortable upright chair . Feet on the ground . Left arm resting comfortably on a flat surface at the level of your heart . Legs uncrossed . Back supported . Sit quietly and don't talk . Place the cuff on your bare arm . Adjust snuggly, so that only two fingertips can fit between your skin and the top of the cuff . Check 2 readings separated by at least one minute . Keep a log of your BP readings . For a visual, please reference this diagram: http://ccnc.care/bpdiagram  Provider Name: [facility]     Phone: [phone]  Zone 1: ALL CLEAR  Continue to monitor your symptoms:  . BP reading is less than 140 (top number) or less than 90 (bottom number)  . No right upper stomach pain . No headaches or seeing spots . No feeling nauseated or throwing up . No swelling in face and hands  Zone 2: CAUTION Call your doctor's office for any of the following:  . BP reading is greater than 140 (top number) or greater than 90 (bottom number)  . Stomach pain under your ribs in the middle or right side . Headaches or seeing spots . Feeling nauseated or throwing up . Swelling in face and hands  Zone 3: EMERGENCY  Seek immediate medical care if you have any of the  following:  . BP reading is greater than160 (top number) or greater than 110 (bottom number) . Severe headaches not improving with Tylenol . Serious difficulty catching your breath . Any worsening symptoms from Zone 2  Preterm Labor and Birth Information  The normal length of a pregnancy is 39-41 weeks. Preterm labor is when labor starts before 37 completed weeks of pregnancy. What are the risk factors for preterm labor? Preterm labor is more likely to occur in women who:  Have certain infections during pregnancy such as a bladder infection, sexually transmitted infection, or infection inside the uterus (chorioamnionitis).  Have a shorter-than-normal cervix.  Have gone into preterm  labor before.  Have had surgery on their cervix.  Are younger than age 69 or older than age 11.  Are African American.  Are pregnant with twins or multiple babies (multiple gestation).  Take street drugs or smoke while pregnant.  Do not gain enough weight while pregnant.  Became pregnant shortly after having been pregnant. What are the symptoms of preterm labor? Symptoms of preterm labor include:  Cramps similar to those that can happen during a menstrual period. The cramps may happen with diarrhea.  Pain in the abdomen or lower back.  Regular uterine contractions that may feel like tightening of the abdomen.  A feeling of increased pressure in the pelvis.  Increased watery or bloody mucus discharge from the vagina.  Water breaking (ruptured amniotic sac). Why is it important to recognize signs of preterm labor? It is important to recognize signs of preterm labor because babies who are born prematurely may not be fully developed. This can put them at an increased risk for:  Long-term (chronic) heart and lung problems.  Difficulty immediately after birth with regulating body systems, including blood sugar, body temperature, heart rate, and breathing rate.  Bleeding in the brain.  Cerebral palsy.  Learning difficulties.  Death. These risks are highest for babies who are born before 41 weeks of pregnancy. How is preterm labor treated? Treatment depends on the length of your pregnancy, your condition, and the health of your baby. It may involve: 1. Having a stitch (suture) placed in your cervix to prevent your cervix from opening too early (cerclage). 2. Taking or being given medicines, such as: ? Hormone medicines. These may be given early in pregnancy to help support the pregnancy. ? Medicine to stop contractions. ? Medicines to help mature the baby's lungs. These may be prescribed if the risk of delivery is high. ? Medicines to prevent your baby from developing  cerebral palsy. If the labor happens before 34 weeks of pregnancy, you may need to stay in the hospital. What should I do if I think I am in preterm labor? If you think that you are going into preterm labor, call your health care provider right away. How can I prevent preterm labor in future pregnancies? To increase your chance of having a full-term pregnancy:  Do not use any tobacco products, such as cigarettes, chewing tobacco, and e-cigarettes. If you need help quitting, ask your health care provider.  Do not use street drugs or medicines that have not been prescribed to you during your pregnancy.  Talk with your health care provider before taking any herbal supplements, even if you have been taking them regularly.  Make sure you gain a healthy amount of weight during your pregnancy.  Watch for infection. If you think that you might have an infection, get it checked right away.  Make sure to tell  your health care provider if you have gone into preterm labor before. This information is not intended to replace advice given to you by your health care provider. Make sure you discuss any questions you have with your health care provider. Document Revised: 01/25/2019 Document Reviewed: 02/24/2016 Elsevier Patient Education  [place].

## 2020-09-14 ENCOUNTER — Ambulatory Visit (INDEPENDENT_AMBULATORY_CARE_PROVIDER_SITE_OTHER): Payer: Self-pay | Admitting: Obstetrics & Gynecology

## 2020-09-14 ENCOUNTER — Other Ambulatory Visit: Payer: Self-pay

## 2020-09-14 ENCOUNTER — Encounter: Payer: Self-pay | Admitting: Obstetrics & Gynecology

## 2020-09-14 VITALS — BP 109/61 | HR 103 | Wt 273.0 lb

## 2020-09-14 DIAGNOSIS — O24419 Gestational diabetes mellitus in pregnancy, unspecified control: Secondary | ICD-10-CM

## 2020-09-14 DIAGNOSIS — O0990 Supervision of high risk pregnancy, unspecified, unspecified trimester: Secondary | ICD-10-CM

## 2020-09-14 DIAGNOSIS — Z1389 Encounter for screening for other disorder: Secondary | ICD-10-CM

## 2020-09-14 DIAGNOSIS — Z3A34 34 weeks gestation of pregnancy: Secondary | ICD-10-CM

## 2020-09-14 DIAGNOSIS — Z331 Pregnant state, incidental: Secondary | ICD-10-CM

## 2020-09-14 LAB — POCT URINALYSIS DIPSTICK OB
Glucose, UA: NEGATIVE
Ketones, UA: NEGATIVE
Leukocytes, UA: NEGATIVE
Nitrite, UA: NEGATIVE
POC,PROTEIN,UA: NEGATIVE

## 2020-09-14 NOTE — Progress Notes (Signed)
HIGH-RISK PREGNANCY VISIT Patient name: Colleen Arnold MRN [id number]  Date of birth: 03-15-1986 Chief Complaint:   Routine Prenatal Visit  History of Present Illness:   Colleen Arnold is a 34 y.o. G76P1001 female at [redacted]w[redacted]d with an Estimated Date of Delivery: 10/25/20 being seen today for ongoing management of a high-risk pregnancy complicated by diabetes mellitus A2DM currently on on metfromin 500 in am 1000 in pm +glyburide 2.5 mg.  Today she reports back pain pelvic pressure.  Depression screen [facility] 2/9 08/04/2020 07/27/2020 04/27/2020  Decreased Interest 0 0 0  Down, Depressed, Hopeless 0 0 0  PHQ - 2 Score 0 0 0  Altered sleeping - 1 1  Tired, decreased energy - 1 1  Change in appetite - 0 0  Feeling bad or failure about yourself  - 0 0  Trouble concentrating - 0 0  Moving slowly or fidgety/restless - 0 0  Suicidal thoughts - 0 0  PHQ-9 Score - 2 2    Contractions: Irritability. Vag. Bleeding: None.  Movement: Present. denies leaking of fluid.  Review of Systems:   Pertinent items are noted in HPI Denies abnormal vaginal discharge w/ itching/odor/irritation, headaches, visual changes, shortness of breath, chest pain, abdominal pain, severe nausea/vomiting, or problems with urination or bowel movements unless otherwise stated above. Pertinent History Reviewed:  Reviewed past medical,surgical, social, obstetrical and family history.  Reviewed problem list, medications and allergies. Physical Assessment:   Vitals:   09/14/20 1116  BP: 109/61  Pulse: (!) 103  Weight: 273 lb (123.8 kg)  Body mass index is 49.14 kg/m.           Physical Examination:   General appearance: alert, well appearing, and in no distress  Mental status: alert, oriented to person, place, and time  Skin: warm & dry   Extremities: Edema: Trace    Cardiovascular: normal heart rate noted  Respiratory: normal respiratory effort, no distress  Abdomen: gravid, soft, non-tender  Pelvic: Cervical exam deferred          Fetal Status: Fetal Heart Rate (bpm): 135 Fundal Height: 36 cm Movement: Present    Fetal Surveillance Testing today: Reactive NST  Colleen Arnold is at [redacted]w[redacted]d Estimated Date of Delivery: 10/25/20  NST being performed due to Class A2 DM  Today the NST is Reactive  Fetal Monitoring:  Baseline: 135 bpm, Variability: Good {> 6 bpm), Accelerations: Reactive and Decelerations: Absent   reactive  The accelerations are >15 bpm and more than 2 in 20 minutes  Final diagnosis:  Reactive NST  Florian Buff, MD      Chaperone: N/A    Results for orders placed or performed in visit on 09/14/20 (from the past 24 hour(s))  POC Urinalysis Dipstick OB   Collection Time: 09/14/20 11:16 AM  Result Value Ref Range   Color, UA     Clarity, UA     Glucose, UA Negative Negative   Bilirubin, UA     Ketones, UA neg    Spec Grav, UA     Blood, UA small    pH, UA     POC,PROTEIN,UA Negative Negative, Trace, Small (1+), Moderate (2+), Large (3+), 4+   Urobilinogen, UA     Nitrite, UA neg    Leukocytes, UA Negative Negative   Appearance     Odor      Assessment & Plan:  High-risk pregnancy: G2P1001 at [redacted]w[redacted]d with an Estimated Date of Delivery: 10/25/20   1) A2 DM on metformin 500  in am 1000 pm = glyburide 2.5 mg,   2) Fetal macrosomia + previous C section, probable repeat, repeat EFW 36-37 weeks  Meds: No orders of the defined types were placed in this encounter.   Labs/procedures today: NST reactive  Treatment Plan:  Twice weekly NST EFW 36-37 weeks  Reviewed: Preterm labor symptoms and general obstetric precautions including but not limited to vaginal bleeding, contractions, leaking of fluid and fetal movement were reviewed in detail with the patient.  All questions were answered.  home bp cuff. Rx faxed to . Check bp weekly, let us know if >140/90.   Follow-up: Return in about 3 days (around 09/17/2020) for NST, Nurse only.   Future Appointments  Date Time Provider [place]   09/21/2020 10:50 AM Florian Buff, MD [facility]    Orders Placed This Encounter  Procedures  . POC Urinalysis Dipstick OB   Mertie Clause Ondria Oswald  09/14/2020 12:12 PM

## 2020-09-17 ENCOUNTER — Other Ambulatory Visit: Payer: Self-pay

## 2020-09-17 ENCOUNTER — Ambulatory Visit (INDEPENDENT_AMBULATORY_CARE_PROVIDER_SITE_OTHER): Payer: Self-pay | Admitting: *Deleted

## 2020-09-17 VITALS — BP 133/76 | HR 93 | Wt 273.0 lb

## 2020-09-17 DIAGNOSIS — O24419 Gestational diabetes mellitus in pregnancy, unspecified control: Secondary | ICD-10-CM

## 2020-09-17 DIAGNOSIS — Z331 Pregnant state, incidental: Secondary | ICD-10-CM

## 2020-09-17 DIAGNOSIS — Z3A34 34 weeks gestation of pregnancy: Secondary | ICD-10-CM

## 2020-09-17 DIAGNOSIS — Z1389 Encounter for screening for other disorder: Secondary | ICD-10-CM

## 2020-09-17 DIAGNOSIS — O0993 Supervision of high risk pregnancy, unspecified, third trimester: Secondary | ICD-10-CM

## 2020-09-17 LAB — POCT URINALYSIS DIPSTICK OB
Glucose, UA: NEGATIVE
Leukocytes, UA: NEGATIVE
Nitrite, UA: NEGATIVE
POC,PROTEIN,UA: NEGATIVE

## 2020-09-17 NOTE — Progress Notes (Addendum)
   NURSE VISIT- NST  SUBJECTIVE:  Colleen Arnold is a 34 y.o. G5P1001 female at [redacted]w[redacted]d, here for a NST for pregnancy complicated by Q7KU currently on Metformin.  She reports active fetal movement, contractions: none, vaginal bleeding: none, membranes: intact.   OBJECTIVE:  BP 133/76   Pulse 93   Wt 273 lb (123.8 kg)   LMP 12/24/2019   BMI 49.14 kg/m   Appears well, no apparent distress  Results for orders placed or performed in visit on 09/17/20 (from the past 24 hour(s))  POC Urinalysis Dipstick OB   Collection Time: 09/17/20 10:01 AM  Result Value Ref Range   Color, UA     Clarity, UA     Glucose, UA Negative Negative   Bilirubin, UA     Ketones, UA trace    Spec Grav, UA     Blood, UA trace    pH, UA     POC,PROTEIN,UA Negative Negative, Trace, Small (1+), Moderate (2+), Large (3+), 4+   Urobilinogen, UA     Nitrite, UA neg    Leukocytes, UA Negative Negative   Appearance     Odor      NST: FHR baseline 135 bpm, Variability: moderate, Accelerations:present, Decelerations:  Absent= Cat 1 /Reactive Toco: none   ASSESSMENT: G2P1001 at [redacted]w[redacted]d with A2DM currently on Metformin NST reactive  PLAN: EFM strip reviewed by Nigel Berthold, CNM   Recommendations: keep next appointment as scheduled    [name]  09/17/2020 3:27 PM   Chart reviewed for nurse visit. Agree with plan of care.  Christin Fudge, [place] 09/17/2020 6:47 PM

## 2020-09-21 ENCOUNTER — Encounter: Payer: Self-pay | Admitting: Obstetrics & Gynecology

## 2020-09-21 ENCOUNTER — Other Ambulatory Visit: Payer: Self-pay

## 2020-09-21 ENCOUNTER — Ambulatory Visit (INDEPENDENT_AMBULATORY_CARE_PROVIDER_SITE_OTHER): Payer: Self-pay | Admitting: Obstetrics & Gynecology

## 2020-09-21 VITALS — BP 131/78 | HR 94 | Wt 277.0 lb

## 2020-09-21 DIAGNOSIS — O0993 Supervision of high risk pregnancy, unspecified, third trimester: Secondary | ICD-10-CM

## 2020-09-21 DIAGNOSIS — Z3A35 35 weeks gestation of pregnancy: Secondary | ICD-10-CM

## 2020-09-21 DIAGNOSIS — Z331 Pregnant state, incidental: Secondary | ICD-10-CM

## 2020-09-21 DIAGNOSIS — Z1389 Encounter for screening for other disorder: Secondary | ICD-10-CM

## 2020-09-21 LAB — POCT URINALYSIS DIPSTICK OB
Glucose, UA: NEGATIVE
Ketones, UA: NEGATIVE
Nitrite, UA: NEGATIVE
POC,PROTEIN,UA: NEGATIVE

## 2020-09-21 NOTE — Progress Notes (Signed)
Brownish vaginal discharge. + back pain

## 2020-09-21 NOTE — Progress Notes (Signed)
HIGH-RISK PREGNANCY VISIT Patient name: Colleen Arnold MRN [id number]  Date of birth: [birth year] Chief Complaint:   High Risk Gestation (NST; + pressure)  History of Present Illness:   Colleen Arnold is a 34 y.o. G25P1001 female at [redacted]w[redacted]d with an Estimated Date of Delivery: 10/25/20 being seen today for ongoing management of a high-risk pregnancy complicated by diabetes mellitus A2DM currently on metformin 500/1000 + glyburine 2.5 in evening.  Today she reports back pain + brownish discharge, no odor no itching.  Depression screen [facility] 2/9 08/04/2020 07/27/2020 04/27/2020  Decreased Interest 0 0 0  Down, Depressed, Hopeless 0 0 0  PHQ - 2 Score 0 0 0  Altered sleeping - 1 1  Tired, decreased energy - 1 1  Change in appetite - 0 0  Feeling bad or failure about yourself  - 0 0  Trouble concentrating - 0 0  Moving slowly or fidgety/restless - 0 0  Suicidal thoughts - 0 0  PHQ-9 Score - 2 2    Contractions: Irregular. Vag. Bleeding: None.  Movement: (!) Decreased. denies leaking of fluid.  Review of Systems:   Pertinent items are noted in HPI Denies abnormal vaginal discharge w/ itching/odor/irritation, headaches, visual changes, shortness of breath, chest pain, abdominal pain, severe nausea/vomiting, or problems with urination or bowel movements unless otherwise stated above. Pertinent History Reviewed:  Reviewed past medical,surgical, social, obstetrical and family history.  Reviewed problem list, medications and allergies. Physical Assessment:   Vitals:   09/21/20 1114  BP: 131/78  Pulse: 94  Weight: 277 lb (125.6 kg)  Body mass index is 49.86 kg/m.           Physical Examination:   General appearance: alert, well appearing, and in no distress  Mental status: alert, oriented to person, place, and time  Skin: warm & dry   Extremities: Edema: Trace    Cardiovascular: normal heart rate noted  Respiratory: normal respiratory effort, no distress  Abdomen: gravid, soft,  non-tender  Pelvic: Cervical exam deferred         Fetal Status: Fetal Heart Rate (bpm): 140 Fundal Height: 39 cm Movement: (!) Decreased    Fetal Surveillance Testing today: Reactive NST  Colleen Arnold is at [redacted]w[redacted]d Estimated Date of Delivery: 10/25/20  NST being performed due to Class A2 DM  Today the NST is Reactive  Fetal Monitoring:  Baseline: 135 bpm, Variability: Good {> 6 bpm), Accelerations: Reactive and Decelerations: Absent   reactive  The accelerations are >15 bpm and more than 2 in 20 minutes  Final diagnosis:  Reactive NST  Florian Buff, MD      Chaperone: N/A    Results for orders placed or performed in visit on 09/21/20 (from the past 24 hour(s))  POC Urinalysis Dipstick OB   Collection Time: 09/21/20 11:16 AM  Result Value Ref Range   Color, UA     Clarity, UA     Glucose, UA Negative Negative   Bilirubin, UA     Ketones, UA neg    Spec Grav, UA     Blood, UA 1+    pH, UA     POC,PROTEIN,UA Negative Negative, Trace, Small (1+), Moderate (2+), Large (3+), 4+   Urobilinogen, UA     Nitrite, UA neg    Leukocytes, UA Trace (A) Negative   Appearance     Odor      Assessment & Plan:  High-risk pregnancy: G2P1001 at [redacted]w[redacted]d with an Estimated Date of Delivery: 10/25/20  1) Class A2 DM, stable, metformin 500am, 1000 pm, glyburide 2.5  2) ,   Meds: No orders of the defined types were placed in this encounter.   Labs/procedures today: Reactive NST  Treatment Plan:    Reviewed: Preterm labor symptoms and general obstetric precautions including but not limited to vaginal bleeding, contractions, leaking of fluid and fetal movement were reviewed in detail with the patient.  All questions were answered.  home bp cuff. Rx faxed to . Check bp weekly, let us know if >140/90.   Follow-up: Return in about 3 days (around 09/24/2020) for NST, Nurse only,  09/28/20 NST +, HROB, 12/16 NST + Nurse, 12/20 sonogram for EFW/BPP.   Future Appointments  Date Time Provider  [place]  09/24/2020 10:30 AM [facility] NURSE [facility]    Orders Placed This Encounter  Procedures  . POC Urinalysis Dipstick OB   Florian Buff  09/21/2020 11:49 AM

## 2020-09-24 ENCOUNTER — Ambulatory Visit (INDEPENDENT_AMBULATORY_CARE_PROVIDER_SITE_OTHER): Payer: Self-pay | Admitting: *Deleted

## 2020-09-24 ENCOUNTER — Other Ambulatory Visit: Payer: Self-pay

## 2020-09-24 VITALS — BP 136/77 | HR 88 | Wt 273.5 lb

## 2020-09-24 DIAGNOSIS — Z331 Pregnant state, incidental: Secondary | ICD-10-CM

## 2020-09-24 DIAGNOSIS — O0993 Supervision of high risk pregnancy, unspecified, third trimester: Secondary | ICD-10-CM

## 2020-09-24 DIAGNOSIS — Z3A35 35 weeks gestation of pregnancy: Secondary | ICD-10-CM

## 2020-09-24 DIAGNOSIS — Z1389 Encounter for screening for other disorder: Secondary | ICD-10-CM

## 2020-09-24 DIAGNOSIS — O24419 Gestational diabetes mellitus in pregnancy, unspecified control: Secondary | ICD-10-CM

## 2020-09-24 LAB — POCT URINALYSIS DIPSTICK OB
Glucose, UA: NEGATIVE
Ketones, UA: NEGATIVE
Leukocytes, UA: NEGATIVE
Nitrite, UA: NEGATIVE
POC,PROTEIN,UA: NEGATIVE

## 2020-09-26 ENCOUNTER — Inpatient Hospital Stay (HOSPITAL_COMMUNITY)
Admission: AD | Admit: 2020-09-26 | Discharge: 2020-09-26 | Disposition: A | Discharge status: Home / Self Care | Payer: Self-pay | Attending: Obstetrics and Gynecology | Admitting: Obstetrics and Gynecology

## 2020-09-26 ENCOUNTER — Inpatient Hospital Stay (HOSPITAL_BASED_OUTPATIENT_CLINIC_OR_DEPARTMENT_OTHER): Payer: Self-pay

## 2020-09-26 ENCOUNTER — Encounter (HOSPITAL_COMMUNITY): Payer: Self-pay | Admitting: Obstetrics and Gynecology

## 2020-09-26 ENCOUNTER — Other Ambulatory Visit: Payer: Self-pay

## 2020-09-26 DIAGNOSIS — O0993 Supervision of high risk pregnancy, unspecified, third trimester: Secondary | ICD-10-CM

## 2020-09-26 DIAGNOSIS — Z3A35 35 weeks gestation of pregnancy: Secondary | ICD-10-CM

## 2020-09-26 DIAGNOSIS — O4703 False labor before 37 completed weeks of gestation, third trimester: Secondary | ICD-10-CM | POA: Insufficient documentation

## 2020-09-26 DIAGNOSIS — Z3689 Encounter for other specified antenatal screening: Secondary | ICD-10-CM

## 2020-09-26 DIAGNOSIS — O368130 Decreased fetal movements, third trimester, not applicable or unspecified: Secondary | ICD-10-CM

## 2020-09-26 LAB — URINALYSIS, ROUTINE W REFLEX MICROSCOPIC
Bacteria, UA: NONE SEEN
Bilirubin Urine: NEGATIVE
Glucose, UA: NEGATIVE mg/dL
Hgb urine dipstick: NEGATIVE
Ketones, ur: NEGATIVE mg/dL
Leukocytes,Ua: NEGATIVE
Nitrite: NEGATIVE
Protein, ur: 30 mg/dL — AB
Specific Gravity, Urine: 1.027 (ref 1.005–1.030)
pH: 6 (ref 5.0–8.0)

## 2020-09-26 LAB — POCT FERN TEST: POCT Fern Test: NEGATIVE

## 2020-09-26 LAB — WET PREP, GENITAL
Clue Cells Wet Prep HPF POC: NONE SEEN
Sperm: NONE SEEN
Trich, Wet Prep: NONE SEEN
Yeast Wet Prep HPF POC: NONE SEEN

## 2020-09-26 NOTE — MAU Provider Note (Addendum)
Chief Complaint:  Contractions and Decreased Fetal Movement    Event Date/Time   First Provider Initiated Contact with Patient 09/26/20 1825     HPI: Colleen Arnold is a 34 y.o. G2P1001 at 14w6dwho presents to maternity admissions reporting contractions, decreased fetal movement and clear/brown discharge. Pt reports contractions and increased abdominal pain and pelvic pressure and decreased fetal movement starting today around 1100. Contractions and pressure relieved with Tylenol and a warm bath for a few hours. Pt reports light pink and brown discharge upon wiping. Denies leaking of fluid, fever, falls, or recent illness.   Pregnancy Course: history of previous c/s in 2009, A2DM  Past Medical History:  Diagnosis Date  . Bile duct stone   . Cholecystitis, acute with cholelithiasis   . Cyst, ovarian   . Elevated LFTs   . Gestational diabetes    OB History  Gravida Para Term Preterm AB Living  _0 SAB IAB Ectopic Multiple Live Births          1    # Outcome Date GA Lbr Len/2nd Weight Sex Delivery Anes PTL Lv  2 Current           1 Term 07/19/08   8 lb 6 oz (3.799 kg) F CS-LTranv  N LIV   Past Surgical History:  Procedure Laterality Date  . CESAREAN SECTION    . CHOLECYSTECTOMY    . ERCP N/A 10/06/2017   Procedure: ENDOSCOPIC RETROGRADE CHOLANGIOPANCREATOGRAPHY (ERCP);  Surgeon: GGatha Mayer MD;  [facility];  Service: Endoscopy;  Laterality: N/A;  . LAPAROSCOPIC UNILATERAL SALPINGO OOPHERECTOMY Left 11/28/2017   Procedure: LAPAROSCOPIC LEFT SALPINGO OOPHORECTOMY ;  Surgeon: FJonnie Kind MD;  Location: [facility];  Service: Gynecology;  Laterality: Left;   Family History  Problem Relation Age of Onset  . Hyperlipidemia Mother   . Diabetes Father   . Hypertension Brother   . Colon cancer Neg Hx    Social History   Tobacco Use  . Smoking status: Never Smoker  . Smokeless tobacco: Never Used  Vaping Use  . Vaping Use: Never used  Substance Use  Topics  . Alcohol use: No  . Drug use: No   Allergies  Allergen Reactions  . Rocephin [Ceftriaxone Sodium In Dextrose] Rash   Medications Prior to Admission  Medication Sig Dispense Refill Last Dose  . acetaminophen (TYLENOL) 325 MG tablet Take 2 tablets (650 mg total) by mouth every 6 (six) hours as needed for up to 30 doses for mild pain or fever. 30 tablet 0 09/26/2020 at 1400  . glyBURIDE (DIABETA) 2.5 MG tablet Take 1 tablet (2.5 mg total) by mouth at bedtime. 30 tablet 3 09/25/2020 at Unknown time  . metFORMIN (GLUCOPHAGE) 500 MG tablet Take 1 tablet (500 mg total) by mouth 2 (two) times daily with a meal. 60 tablet 3 09/26/2020 at Unknown time  . pantoprazole (PROTONIX) 20 MG tablet Take 1 tablet (20 mg total) by mouth daily. 30 tablet 6 09/26/2020 at Unknown time  . prenatal vitamin w/FE, FA (PRENATAL 1 + 1) 27-1 MG TABS tablet Take 1 tablet by mouth daily at 12 noon. 30 tablet 11 09/26/2020 at Unknown time  . Accu-Chek Softclix Lancets lancets Use as instructed to check blood sugar 4 times daily 100 each 12   . Blood Glucose Monitoring Suppl (ACCU-CHEK GUIDE ME) w/Device KIT 1 each by Does not apply route 4 (four) times daily. 1 kit 0   .  Blood Pressure Monitoring (BLOOD PRESSURE CUFF) MISC 1 Device by Does not apply route daily. (Patient not taking: No sig reported) 1 each 0   . glucose blood (ACCU-CHEK GUIDE) test strip Use as instructed to check blood sugar 4 times daily 50 each 12     I have reviewed patient's Past Medical Hx, Surgical Hx, Family Hx, Social Hx, medications and allergies.   ROS:  Review of Systems  Constitutional: Negative for chills, fatigue and fever.  HENT: Negative for congestion, sneezing and sore throat.   Eyes: Negative for visual disturbance.  Respiratory: Negative for cough and shortness of breath.   Gastrointestinal: Negative for diarrhea, nausea and vomiting.  Genitourinary: Positive for pelvic pain (increased pelvic pain & pressure), vaginal  bleeding (light pink spotting after wiping) and vaginal discharge (clear and brown discharge).  Neurological: Negative for dizziness, light-headedness and headaches.  All other systems reviewed and are negative.   Physical Exam   Patient Vitals for the past 24 hrs:  BP Temp Temp src Pulse Resp SpO2 Height Weight  09/26/20 1825 109/71 -- -- (!) 107 20 98 % -- --  09/26/20 1820 -- -- -- -- -- 99 % -- --  09/26/20 1815 -- -- -- -- -- 98 % -- --  09/26/20 1810 -- -- -- -- -- 99 % -- --  09/26/20 1805 -- -- -- -- -- 98 % -- --  09/26/20 1804 121/69 -- -- (!) 114 20 -- -- --  09/26/20 1802 -- 98.3 F (36.8 C) Oral -- -- -- -- --  09/26/20 1750 -- -- -- -- -- -- _0  (1.6 m) 275 lb 1.6 oz (124.8 kg)    Constitutional: Well-developed, well-nourished female in no acute distress.  Cardiovascular: normal rate & rhythm, no murmur Respiratory: normal effort, lung sounds clear throughout GI: Abd soft, non-tender, gravid appropriate for gestational age. Pos BS x 4 MS: Extremities nontender, no edema, normal ROM Neurologic: Alert and oriented x 4.      Pelvic: NEFG, white discharge, no blood, cervix clean.   Dilation: Closed Effacement (%): Thick Exam by:: Gaylan Gerold CNM  Fetal Tracing: reactive Baseline: 140 Variability: moderate Accelerations: present  Decelerations: absent Toco: irregular q5-6 minutes   Labs: Results for orders placed or performed during the hospital encounter of 09/26/20 (from the past 24 hour(s))  Urinalysis, Routine w reflex microscopic Urine, Clean Catch     Status: Abnormal   Collection Time: 09/26/20  6:24 PM  Result Value Ref Range   Color, Urine YELLOW YELLOW   APPearance HAZY (A) CLEAR   Specific Gravity, Urine 1.027 1.005 - 1.030   pH 6.0 5.0 - 8.0   Glucose, UA NEGATIVE NEGATIVE mg/dL   Hgb urine dipstick NEGATIVE NEGATIVE   Bilirubin Urine NEGATIVE NEGATIVE   Ketones, ur NEGATIVE NEGATIVE mg/dL   Protein, ur 30 (A) NEGATIVE mg/dL   Nitrite  NEGATIVE NEGATIVE   Leukocytes,Ua NEGATIVE NEGATIVE   RBC / HPF 6-10 0 - 5 RBC/hpf   WBC, UA 0-5 0 - 5 WBC/hpf   Bacteria, UA NONE SEEN NONE SEEN   Squamous Epithelial / LPF 0-5 0 - 5   Mucus PRESENT   Wet prep, genital     Status: Abnormal   Collection Time: 09/26/20  6:54 PM   Specimen: PATH Cytology Cervicovaginal Ancillary Only  Result Value Ref Range   Yeast Wet Prep HPF POC NONE SEEN NONE SEEN   Trich, Wet Prep NONE SEEN NONE SEEN   Clue Cells Wet  Prep HPF POC NONE SEEN NONE SEEN   WBC, Wet Prep HPF POC FEW (A) NONE SEEN   Sperm NONE SEEN   Fern Test     Status: None   Collection Time: 09/26/20  7:23 PM  Result Value Ref Range   POCT Fern Test Negative = intact amniotic membranes     Imaging:  BPP 8/8  Media Information         Document Information  Ultrasound    09/26/2020 00:00  Attached To:  Hospital Encounter on 09/26/20   Source Information  Default, Provider, MD     MAU Course: Orders Placed This Encounter  Procedures  . Wet prep, genital  . [place] MFM Fetal BPP Wo Non Stress  . Urinalysis, Routine w reflex microscopic Urine, Clean Catch  . Fern Test  . Discharge patient   MDM: No pooling on speculum exam, FFN & fern negative  GC/CT pending Wet prep normal BPP 8/8  Discussed BH vs contractions that cause cervical change with pt and husband, encouraged them to return to the hospital when contractions are q4-31mn and lasting one minute long. Pt and FOB expressed understanding.  Assessment: 1. False labor before 37 completed weeks of gestation in third trimester   2. Supervision of high risk pregnancy in third trimester   3. NST (non-stress test) reactive    Plan: Discharge home in stable condition with preterm labor precautions.  Follow up at [facility] as scheduled for ongoing prenatal care See AVS for patient education  JGaylan Gerold CNM, MSN, [facility]12/11/21 9:14 PM

## 2020-09-26 NOTE — Discharge Instructions (Signed)
Contracciones de [employer]xton [employer] Las contracciones del tero pueden presentarse durante todo el [place] no siempre indican que la mujer est de [place]. Es posible que usted haya tenido contracciones de prctica llamadas "contracciones de [place]". A veces, se las confunde con el parto real. Qu son las contracciones de [place]? Las contracciones de [place] son espasmos que se producen en los msculos del tero antes del [place]. A diferencia de las contracciones del parto verdadero, estas no producen el agrandamiento (la dilatacin) ni el afinamiento del cuello uterino. Hacia el final del embarazo [facility] las semanas [phone]), las contracciones de Braxton Hicks pueden presentarse ms seguido y tornarse ms intensas. A veces, resulta difcil distinguirlas del parto verdadero porque pueden ser [employer]. No debe sentirse avergonzada si concurre al hospital con falso parto. En ocasiones, la nica forma de saber si el trabajo de parto es verdadero es que el mdico determine si hay cambios en el cuello del tero. El [employer] har un examen fsico y Armed forces operational officer controle las contracciones. Si usted no est de Network engineer, el examen debe indicar que el cuello uterino no est dilatado y que usted no ha roto Financial trader. Si no hay otros problemas de salud asociados con su embarazo, no habr inconvenientes si la envan a su casa con un falso parto. Es posible que las contracciones de Braxton Hicks continen hasta que se desencadene el parto verdadero. Cmo diferenciar el [place] de parto falso del verdadero Trabajo de parto verdadero  Las contracciones [place].  Las contracciones pueden tornarse muy regulares.  La molestia generalmente se siente en la parte superior del tero y se extiende hacia la zona baja del abdomen y [employer] cintura.  Las contracciones no desaparecen cuando usted camina.  Las contracciones generalmente se hacen ms  intensas y Copywriter, advertising.  El cuello uterino se dilata y se afina. Parto falso  En general, las contracciones son ms cortas y no tan intensas como las del parto verdadero.  En general, las contracciones son irregulares.  A menudo, las contracciones se sienten en la parte delantera de la parte baja del abdomen y en la ingle.  Las Physiological scientist cuando usted camina o [place] de posicin mientras est [place].  Las contracciones se vuelven ms dbiles y su duracin es menor a medida que transcurre [employer].  En general, el cuello uterino no se dilata ni se afina. Siga estas indicaciones en su casa:   Tome los medicamentos de venta libre y los recetados solamente como se lo haya indicado el mdico.  Contine haciendo los ejercicios habituales y siga las dems indicaciones que el mdico le d.  Coma y beba con moderacin si cree que est de parto.  Si las contracciones de [employer] provocan incomodidad: ? Cambie de posicin: si est acostada o descansando, camine; si est caminando, descanse. ? Sintese y descanse en una baera con agua tibia. ? Beba suficiente lquido como para mantener la orina de color amarillo plido. La deshidratacin puede provocar contracciones. ? Respire lenta y profundamente varias veces por hora.  Vaya a todas las visitas de control prenatales y de control como se lo haya indicado el mdico. Esto es importante. Comunquese con un mdico si:  Tiene fiebre.  Siente dolor constante en el abdomen. Solicite ayuda de inmediato si:  Las contracciones se intensifican, se hacen ms regulares y Industrial/product designer s.  Tiene una prdida de lquido por la vagina.  Champ Mungo  una mucosidad sanguinolenta (prdida del tapn mucoso).  Tiene una hemorragia vaginal.  Tiene un dolor en la zona lumbar que nunca tuvo antes.  Siente que la cabeza del beb empuja hacia abajo y ejerce presin en la zona plvica.  El beb no se mueve tanto  como antes. Resumen  Las [employer] se presentan antes del parto se conocen como contracciones de [place], [place] parto o contracciones de Location manager.  En general, las contracciones de Braxton Hicks son ms cortas, ms dbiles, con ms tiempo entre una y [place], y menos regulares que las contracciones del parto verdadero. Las contracciones del parto verdadero se intensifican progresivamente y se tornan regulares y ms frecuentes.  Para controlar la [employer] producen las contracciones de [place], puede cambiar de posicin, darse un bao templado y Production assistant, radio, beber mucha agua o practicar la respiracin profunda. Esta informacin no tiene Marine scientist el consejo del mdico. Asegrese de hacerle al mdico cualquier pregunta que tenga. Document Revised: 01/12/2018 Document Reviewed: 05/15/2017 Elsevier Patient Education  [place].

## 2020-09-26 NOTE — MAU Note (Signed)
Patient presented to MAU at [redacted]w[redacted]d gestation with c/o contractions that have progressively gotten stronger since 9am this morning. Patient also states she hasn't felt the baby move since 11am today. Patient is feeling a lot of abdominal cramping and pressure. She reports having a small amount of brown discharge vaginally, but does not report any big gushes of fluid.

## 2020-09-28 ENCOUNTER — Encounter: Payer: Self-pay | Admitting: Women's Health

## 2020-09-28 ENCOUNTER — Ambulatory Visit (INDEPENDENT_AMBULATORY_CARE_PROVIDER_SITE_OTHER): Payer: Self-pay | Admitting: Women's Health

## 2020-09-28 ENCOUNTER — Other Ambulatory Visit: Payer: Self-pay

## 2020-09-28 VITALS — BP 119/76 | HR 86 | Wt 276.0 lb

## 2020-09-28 DIAGNOSIS — Z3A36 36 weeks gestation of pregnancy: Secondary | ICD-10-CM

## 2020-09-28 DIAGNOSIS — O24419 Gestational diabetes mellitus in pregnancy, unspecified control: Secondary | ICD-10-CM

## 2020-09-28 DIAGNOSIS — O0993 Supervision of high risk pregnancy, unspecified, third trimester: Secondary | ICD-10-CM

## 2020-09-28 LAB — GC/CHLAMYDIA PROBE AMP (~~LOC~~) NOT AT ARMC
Chlamydia: NEGATIVE
Comment: NEGATIVE
Comment: NORMAL
Neisseria Gonorrhea: NEGATIVE

## 2020-09-28 MED ORDER — METFORMIN HCL 1000 MG PO TABS: 1000.0000 mg | ORAL_TABLET | Freq: Two times a day (BID) | ORAL | 0 refills | Status: DC

## 2020-09-28 NOTE — Progress Notes (Signed)
HIGH-RISK PREGNANCY VISIT Patient name: Colleen Arnold MRN [id number]  Date of birth: 1985-10-27 Chief Complaint:   No chief complaint on file.  History of Present Illness:   Colleen Arnold is a 34 y.o. G70P1001 female at [redacted]w[redacted]d with an Estimated Date of Delivery: 10/25/20 being seen today for ongoing management of a high-risk pregnancy complicated by diabetes mellitus A2DM currently on metformin 500am/1,000pm, glyburide 2.5mg  pm.  Today she reports FBS 88-100 (2 >95), 2hr 103-135 (~1/2 >120). Went to MAU Sat for f/o labor, ft/th. Still having contractions, feels regular. Lots of pressure.  Depression screen [facility] 2/9 08/04/2020 07/27/2020 04/27/2020  Decreased Interest 0 0 0  Down, Depressed, Hopeless 0 0 0  PHQ - 2 Score 0 0 0  Altered sleeping - 1 1  Tired, decreased energy - 1 1  Change in appetite - 0 0  Feeling bad or failure about yourself  - 0 0  Trouble concentrating - 0 0  Moving slowly or fidgety/restless - 0 0  Suicidal thoughts - 0 0  PHQ-9 Score - 2 2    Contractions: Irregular. Vag. Bleeding: None.  Movement: Present. denies leaking of fluid.  Review of Systems:   Pertinent items are noted in HPI Denies abnormal vaginal discharge w/ itching/odor/irritation, headaches, visual changes, shortness of breath, chest pain, abdominal pain, severe nausea/vomiting, or problems with urination or bowel movements unless otherwise stated above. Pertinent History Reviewed:  Reviewed past medical,surgical, social, obstetrical and family history.  Reviewed problem list, medications and allergies. Physical Assessment:   Vitals:   09/28/20 0934  BP: 119/76  Pulse: 86  Weight: 276 lb (125.2 kg)  Body mass index is 48.89 kg/m.           Physical Examination:   General appearance: alert, well appearing, and in no distress  Mental status: alert, oriented to person, place, and time  Skin: warm & dry   Extremities: Edema: Trace    Cardiovascular: normal heart rate noted  Respiratory:  normal respiratory effort, no distress  Abdomen: gravid, soft, non-tender  Pelvic: Cervical exam performed  Dilation: 1 Effacement (%): Thick Station: -3  Fetal Status: Fetal Heart Rate (bpm): 130   Movement: Present Presentation: Vertex  Fetal Surveillance Testing today: NST: FHR baseline 130 bpm, Variability: moderate, Accelerations:present, Decelerations:  Absent= Cat 1/Reactive Toco: ~q 5-35mins    Chaperone: Latisha Cresenzo    No results found for this or any previous visit (from the past 24 hour(s)).  Assessment & Plan:  High-risk pregnancy: G2P1001 at [redacted]w[redacted]d with an Estimated Date of Delivery: 10/25/20   1) A2DM, 2hr pp high, increase metformin to 1,000mg  BID (new rx sent), continue glyburide 2.5mg  pm  2) Prev c/s, EPCS for suspected macrosomia, baby was only 8lb6oz, wants VBAC if possible, will see what EFW is now  Meds:  Meds ordered this encounter  Medications  . metFORMIN (GLUCOPHAGE) 1000 MG tablet    Sig: Take 1 tablet (1,000 mg total) by mouth 2 (two) times daily with a meal.    Dispense:  30 tablet    Refill:  0    Order Specific Question:   Supervising Provider    Answer:   Florian Buff [[id number]]    Labs/procedures today: nst, gbs, sve  Treatment Plan:  EFW asap, 2x/wk NST, deliver @ 39wks  Reviewed: Preterm labor symptoms and general obstetric precautions including but not limited to vaginal bleeding, contractions, leaking of fluid and fetal movement were reviewed in detail with the patient.  All questions  were answered.   Follow-up: Return for asap efw u/s, then thurs as scheduled for nst/nurse.   Future Appointments  Date Time Provider [place]  09/29/2020  9:45 AM [place] - [facility]YN [place] [facility] None  10/01/2020  9:50 AM [facility] N[facility]    Orders Placed This Encounter  Procedures  . US OB Follow Up   [place], [facility] 09/28/2020 10:53 AM

## 2020-09-28 NOTE — Progress Notes (Addendum)
   NURSE VISIT- NST  SUBJECTIVE:  Colleen Arnold is a 34 y.o. G34P1001 female at [redacted]w[redacted]d, here for a NST for pregnancy complicated by Q7HA currently on Metformin.  She reports active fetal movement, contractions: none, vaginal bleeding: none, membranes: intact.   OBJECTIVE:  BP 136/77   Pulse 88   Wt 273 lb 8 oz (124.1 kg)   LMP 12/24/2019   BMI 49.23 kg/m   Appears well, no apparent distress  No results found for this or any previous visit (from the past 24 hour(s)).  NST: FHR baseline 130 bpm, Variability: moderate, Accelerations:present, Decelerations:  Absent= Cat 1/Reactive Toco: none   ASSESSMENT: G2P1001 at [redacted]w[redacted]d with A2DM currently on Metformin NST reactive  PLAN: EFM strip reviewed by Knute Neu, CNM, [facility]   Recommendations: keep next appointment as scheduled    [name]  09/28/2020 8:44 AM   Chart reviewed for nurse visit. Agree with plan of care.  Roma Schanz, [place] 09/28/2020 9:26 AM

## 2020-09-28 NOTE — Patient Instructions (Addendum)
Andreas Blower, I greatly value your feedback.  If you receive a survey following your visit with [place] today, we appreciate you taking the time to fill it out.  Thanks, Knute Neu, CNM, WHNP-BC  [facility] & [place] at [facility] ([place], [place] [address]) Entrance C, located off of [address] parking   Go to [email or web address] to register for FREE online childbirth classes   Take metformin 1,000mg  twice daily   Call the office [phone]) or go to [facility] if:  You begin to have strong, frequent contractions  Your water breaks.  Sometimes it is a big gush of fluid, sometimes it is just a trickle that keeps getting your panties wet or running down your legs  You have vaginal bleeding.  It is normal to have a small amount of spotting if your cervix was checked.   You don't feel your baby moving like normal.  If you don't, get you something to eat and drink and lay down and focus on feeling your baby move.  You should feel at least 10 movements in 2 hours.  If you don't, you should call the office or go to [facility].   Call the office [phone]) or go to [facility] hospital for these signs of pre-eclampsia:  Severe headache that does not go away with Tylenol  Visual changes- seeing spots, double, blurred vision  Pain under your right breast or upper abdomen that does not go away with Tums or heartburn medicine  Nausea and/or vomiting  Severe swelling in your hands, feet, and face    Home Blood Pressure Monitoring for Patients   Your provider has recommended that you check your blood pressure (BP) at least once a week at home. If you do not have a blood pressure cuff at home, one will be provided for you. Contact your provider if you have not received your monitor within 1 week.   Helpful Tips for Accurate Home Blood Pressure Checks  . Don't smoke, exercise, or drink caffeine 30 minutes before checking your BP . Use the restroom  before checking your BP (a full bladder can raise your pressure) . Relax in a comfortable upright chair . Feet on the ground . Left arm resting comfortably on a flat surface at the level of your heart . Legs uncrossed . Back supported . Sit quietly and don't talk . Place the cuff on your bare arm . Adjust snuggly, so that only two fingertips can fit between your skin and the top of the cuff . Check 2 readings separated by at least one minute . Keep a log of your BP readings . For a visual, please reference this diagram: http://ccnc.care/bpdiagram  Provider Name: [facility]     Phone: [phone]  Zone 1: ALL CLEAR  Continue to monitor your symptoms:  . BP reading is less than 140 (top number) or less than 90 (bottom number)  . No right upper stomach pain . No headaches or seeing spots . No feeling nauseated or throwing up . No swelling in face and hands  Zone 2: CAUTION Call your doctor's office for any of the following:  . BP reading is greater than 140 (top number) or greater than 90 (bottom number)  . Stomach pain under your ribs in the middle or right side . Headaches or seeing spots . Feeling nauseated or throwing up . Swelling in face and hands  Zone 3: EMERGENCY  Seek immediate medical care  if you have any of the following:  . BP reading is greater than160 (top number) or greater than 110 (bottom number) . Severe headaches not improving with Tylenol . Serious difficulty catching your breath . Any worsening symptoms from Zone 2  Preterm Labor and Birth Information  The normal length of a pregnancy is 39-41 weeks. Preterm labor is when labor starts before 37 completed weeks of pregnancy. What are the risk factors for preterm labor? Preterm labor is more likely to occur in women who:  Have certain infections during pregnancy such as a bladder infection, sexually transmitted infection, or infection inside the uterus (chorioamnionitis).  Have a  shorter-than-normal cervix.  Have gone into preterm labor before.  Have had surgery on their cervix.  Are younger than age 91 or older than age 35.  Are African American.  Are pregnant with twins or multiple babies (multiple gestation).  Take street drugs or smoke while pregnant.  Do not gain enough weight while pregnant.  Became pregnant shortly after having been pregnant. What are the symptoms of preterm labor? Symptoms of preterm labor include:  Cramps similar to those that can happen during a menstrual period. The cramps may happen with diarrhea.  Pain in the abdomen or lower back.  Regular uterine contractions that may feel like tightening of the abdomen.  A feeling of increased pressure in the pelvis.  Increased watery or bloody mucus discharge from the vagina.  Water breaking (ruptured amniotic sac). Why is it important to recognize signs of preterm labor? It is important to recognize signs of preterm labor because babies who are born prematurely may not be fully developed. This can put them at an increased risk for:  Long-term (chronic) heart and lung problems.  Difficulty immediately after birth with regulating body systems, including blood sugar, body temperature, heart rate, and breathing rate.  Bleeding in the brain.  Cerebral palsy.  Learning difficulties.  Death. These risks are highest for babies who are born before 73 weeks of pregnancy. How is preterm labor treated? Treatment depends on the length of your pregnancy, your condition, and the health of your baby. It may involve: 1. Having a stitch (suture) placed in your cervix to prevent your cervix from opening too early (cerclage). 2. Taking or being given medicines, such as: ? Hormone medicines. These may be given early in pregnancy to help support the pregnancy. ? Medicine to stop contractions. ? Medicines to help mature the baby's lungs. These may be prescribed if the risk of delivery is  high. ? Medicines to prevent your baby from developing cerebral palsy. If the labor happens before 34 weeks of pregnancy, you may need to stay in the hospital. What should I do if I think I am in preterm labor? If you think that you are going into preterm labor, call your health care provider right away. How can I prevent preterm labor in future pregnancies? To increase your chance of having a full-term pregnancy:  Do not use any tobacco products, such as cigarettes, chewing tobacco, and e-cigarettes. If you need help quitting, ask your health care provider.  Do not use street drugs or medicines that have not been prescribed to you during your pregnancy.  Talk with your health care provider before taking any herbal supplements, even if you have been taking them regularly.  Make sure you gain a healthy amount of weight during your pregnancy.  Watch for infection. If you think that you might have an infection, get it checked right  away.  Make sure to tell your health care provider if you have gone into preterm labor before. This information is not intended to replace advice given to you by your health care provider. Make sure you discuss any questions you have with your health care provider. Document Revised: 01/25/2019 Document Reviewed: 02/24/2016 Elsevier Patient Education  [place].

## 2020-09-28 NOTE — Addendum Note (Signed)
Addended by: Octaviano Glow on: 09/28/2020 12:45 PM   Modules accepted: Orders

## 2020-09-29 ENCOUNTER — Other Ambulatory Visit: Payer: Self-pay | Admitting: Women's Health

## 2020-09-29 ENCOUNTER — Ambulatory Visit (INDEPENDENT_AMBULATORY_CARE_PROVIDER_SITE_OTHER): Payer: Self-pay

## 2020-09-29 DIAGNOSIS — O0993 Supervision of high risk pregnancy, unspecified, third trimester: Secondary | ICD-10-CM

## 2020-09-29 DIAGNOSIS — O24419 Gestational diabetes mellitus in pregnancy, unspecified control: Secondary | ICD-10-CM

## 2020-09-29 NOTE — Progress Notes (Signed)
[place] 92+4 wks,cephalic,posterior placenta gr 3,afi 19.2 cm,fhr 129 bpm,EFW 3995 g 99.8%

## 2020-10-01 ENCOUNTER — Other Ambulatory Visit: Payer: Self-pay

## 2020-10-01 ENCOUNTER — Ambulatory Visit (INDEPENDENT_AMBULATORY_CARE_PROVIDER_SITE_OTHER): Payer: Self-pay | Admitting: *Deleted

## 2020-10-01 VITALS — BP 127/74 | HR 93 | Wt 276.0 lb

## 2020-10-01 DIAGNOSIS — O0993 Supervision of high risk pregnancy, unspecified, third trimester: Secondary | ICD-10-CM

## 2020-10-01 DIAGNOSIS — Z3A36 36 weeks gestation of pregnancy: Secondary | ICD-10-CM

## 2020-10-01 DIAGNOSIS — O24419 Gestational diabetes mellitus in pregnancy, unspecified control: Secondary | ICD-10-CM

## 2020-10-01 DIAGNOSIS — Z1389 Encounter for screening for other disorder: Secondary | ICD-10-CM

## 2020-10-01 DIAGNOSIS — Z331 Pregnant state, incidental: Secondary | ICD-10-CM

## 2020-10-01 LAB — POCT URINALYSIS DIPSTICK OB
Blood, UA: NEGATIVE
Glucose, UA: NEGATIVE
Ketones, UA: NEGATIVE
Leukocytes, UA: NEGATIVE
Nitrite, UA: NEGATIVE
POC,PROTEIN,UA: NEGATIVE

## 2020-10-01 NOTE — Progress Notes (Addendum)
° °  NURSE VISIT- NST  SUBJECTIVE:  Colleen Arnold is a 34 y.o. G76P1001 female at [redacted]w[redacted]d, here for a NST for pregnancy complicated by E5UD currently on Metformin.  She reports active fetal movement, contractions: none, vaginal bleeding: none, membranes: intact.   OBJECTIVE:  BP 127/74    Pulse 93    Wt 276 lb (125.2 kg)    LMP 12/24/2019    BMI 48.89 kg/m   Appears well, no apparent distress  Results for orders placed or performed in visit on 10/01/20 (from the past 24 hour(s))  POC Urinalysis Dipstick OB   Collection Time: 10/01/20 10:10 AM  Result Value Ref Range   Color, UA     Clarity, UA     Glucose, UA Negative Negative   Bilirubin, UA     Ketones, UA neg    Spec Grav, UA     Blood, UA neg    pH, UA     POC,PROTEIN,UA Negative Negative, Trace, Small (1+), Moderate (2+), Large (3+), 4+   Urobilinogen, UA     Nitrite, UA neg    Leukocytes, UA Negative Negative   Appearance     Odor      NST: FHR baseline 130 bpm, Variability: moderate, Accelerations:present, Decelerations:  Absent= Cat 1/Reactive Toco: occasional   ASSESSMENT: G2P1001 at [redacted]w[redacted]d with A2DM currently on Metformin NST reactive  PLAN: EFM strip reviewed by Nigel Berthold, CNM   Recommendations: keep next appointment as scheduled    [name]  10/01/2020 12:19 PM   Chart reviewed for nurse visit. Agree with plan of care.  Christin Fudge, [place] 10/01/2020 2:40 PM

## 2020-10-02 LAB — CULTURE, BETA STREP (GROUP B ONLY): Strep Gp B Culture: NEGATIVE

## 2020-10-05 ENCOUNTER — Other Ambulatory Visit: Payer: Self-pay

## 2020-10-05 ENCOUNTER — Encounter: Payer: Self-pay | Admitting: Women's Health

## 2020-10-05 ENCOUNTER — Ambulatory Visit (INDEPENDENT_AMBULATORY_CARE_PROVIDER_SITE_OTHER): Payer: Self-pay | Admitting: Women's Health

## 2020-10-05 VITALS — BP 117/70 | HR 102 | Wt 278.0 lb

## 2020-10-05 DIAGNOSIS — O24419 Gestational diabetes mellitus in pregnancy, unspecified control: Secondary | ICD-10-CM

## 2020-10-05 DIAGNOSIS — O0993 Supervision of high risk pregnancy, unspecified, third trimester: Secondary | ICD-10-CM

## 2020-10-05 DIAGNOSIS — Z331 Pregnant state, incidental: Secondary | ICD-10-CM

## 2020-10-05 DIAGNOSIS — Z1389 Encounter for screening for other disorder: Secondary | ICD-10-CM

## 2020-10-05 LAB — POCT URINALYSIS DIPSTICK OB
Blood, UA: 2
Glucose, UA: NEGATIVE
Leukocytes, UA: NEGATIVE
Nitrite, UA: NEGATIVE
POC,PROTEIN,UA: NEGATIVE

## 2020-10-05 NOTE — Patient Instructions (Signed)
Colleen Arnold, I greatly value your feedback.  If you receive a survey following your visit with [place] today, we appreciate you taking the time to fill it out.  Thanks, Colleen Arnold, CNM, WHNP-BC  [facility] & [place] at [facility] ([place], [place] [address]) Entrance C, located off of [address] parking   Go to [email or web address] to register for FREE online childbirth classes    Call the office [phone]) or go to [facility] if:  You begin to have strong, frequent contractions  Your water breaks.  Sometimes it is a big gush of fluid, sometimes it is just a trickle that keeps getting your panties wet or running down your legs  You have vaginal bleeding.  It is normal to have a small amount of spotting if your cervix was checked.   You don't feel your baby moving like normal.  If you don't, get you something to eat and drink and lay down and focus on feeling your baby move.  You should feel at least 10 movements in 2 hours.  If you don't, you should call the office or go to [facility].   Call the office [phone]) or go to [facility] hospital for these signs of pre-eclampsia:  Severe headache that does not go away with Tylenol  Visual changes- seeing spots, double, blurred vision  Pain under your right breast or upper abdomen that does not go away with Tums or heartburn medicine  Nausea and/or vomiting  Severe swelling in your hands, feet, and face    Home Blood Pressure Monitoring for Patients   Your provider has recommended that you check your blood pressure (BP) at least once a week at home. If you do not have a blood pressure cuff at home, one will be provided for you. Contact your provider if you have not received your monitor within 1 week.   Helpful Tips for Accurate Home Blood Pressure Checks  . Don't smoke, exercise, or drink caffeine 30 minutes before checking your BP . Use the restroom before checking your BP (a full bladder  can raise your pressure) . Relax in a comfortable upright chair . Feet on the ground . Left arm resting comfortably on a flat surface at the level of your heart . Legs uncrossed . Back supported . Sit quietly and don't talk . Place the cuff on your bare arm . Adjust snuggly, so that only two fingertips can fit between your skin and the top of the cuff . Check 2 readings separated by at least one minute . Keep a log of your BP readings . For a visual, please reference this diagram: http://ccnc.care/bpdiagram  Provider Name: [facility]     Phone: [phone]  Zone 1: ALL CLEAR  Continue to monitor your symptoms:  . BP reading is less than 140 (top number) or less than 90 (bottom number)  . No right upper stomach pain . No headaches or seeing spots . No feeling nauseated or throwing up . No swelling in face and hands  Zone 2: CAUTION Call your doctor's office for any of the following:  . BP reading is greater than 140 (top number) or greater than 90 (bottom number)  . Stomach pain under your ribs in the middle or right side . Headaches or seeing spots . Feeling nauseated or throwing up . Swelling in face and hands  Zone 3: EMERGENCY  Seek immediate medical care if you have any of the  following:  . BP reading is greater than160 (top number) or greater than 110 (bottom number) . Severe headaches not improving with Tylenol . Serious difficulty catching your breath . Any worsening symptoms from Zone 2   Braxton Hicks Contractions Contractions of the uterus can occur throughout pregnancy, but they are not always a sign that you are in labor. You may have practice contractions called Braxton Hicks contractions. These false labor contractions are sometimes confused with true labor. What are [place] contractions? Braxton Hicks contractions are tightening movements that occur in the muscles of the uterus before labor. Unlike true labor contractions, these contractions do  not result in opening (dilation) and thinning of the cervix. Toward the end of pregnancy (32-34 weeks), Braxton Hicks contractions can happen more often and may become stronger. These contractions are sometimes difficult to tell apart from true labor because they can be very uncomfortable. You should not feel embarrassed if you go to the hospital with false labor. Sometimes, the only way to tell if you are in true labor is for your health care provider to look for changes in the cervix. The health care provider will do a physical exam and may monitor your contractions. If you are not in true labor, the exam should show that your cervix is not dilating and your water has not broken. If there are no other health problems associated with your pregnancy, it is completely safe for you to be sent home with false labor. You may continue to have Braxton Hicks contractions until you go into true labor. How to tell the difference between true labor and false labor True labor  Contractions last 30-70 seconds.  Contractions become very regular.  Discomfort is usually felt in the top of the uterus, and it spreads to the lower abdomen and low back.  Contractions do not go away with walking.  Contractions usually become more intense and increase in frequency.  The cervix dilates and gets thinner. False labor  Contractions are usually shorter and not as strong as true labor contractions.  Contractions are usually irregular.  Contractions are often felt in the front of the lower abdomen and in the groin.  Contractions may go away when you walk around or change positions while lying down.  Contractions get weaker and are shorter-lasting as time goes on.  The cervix usually does not dilate or become thin. Follow these instructions at home:  1. Take over-the-counter and prescription medicines only as told by your health care provider. 2. Keep up with your usual exercises and follow other instructions  from your health care provider. 3. Eat and drink lightly if you think you are going into labor. 4. If Braxton Hicks contractions are making you uncomfortable: ? Change your position from lying down or resting to walking, or change from walking to resting. ? Sit and rest in a tub of warm water. ? Drink enough fluid to keep your urine pale yellow. Dehydration may cause these contractions. ? Do slow and deep breathing several times an hour. 5. Keep all follow-up prenatal visits as told by your health care provider. This is important. Contact a health care provider if:  You have a fever.  You have continuous pain in your abdomen. Get help right away if:  Your contractions become stronger, more regular, and closer together.  You have fluid leaking or gushing from your vagina.  You pass blood-tinged mucus (bloody show).  You have bleeding from your vagina.  You have low back pain  that you never had before.  You feel your baby's head pushing down and causing pelvic pressure.  Your baby is not moving inside you as much as it used to. Summary  Contractions that occur before labor are called Braxton Hicks contractions, false labor, or practice contractions.  Braxton Hicks contractions are usually shorter, weaker, farther apart, and less regular than true labor contractions. True labor contractions usually become progressively stronger and regular, and they become more frequent.  Manage discomfort from [facility] contractions by changing position, resting in a warm bath, drinking plenty of water, or practicing deep breathing. This information is not intended to replace advice given to you by your health care provider. Make sure you discuss any questions you have with your health care provider. Document Revised: 09/15/2017 Document Reviewed: 02/16/2017 Elsevier Patient Education  [place].

## 2020-10-05 NOTE — Progress Notes (Signed)
HIGH-RISK PREGNANCY VISIT Patient name: Colleen Arnold MRN [id number]  Date of birth: 1986-05-01 Chief Complaint:   Routine Prenatal Visit (NST/ concern about ketone in urine)  History of Present Illness:   Colleen Arnold is a 34 y.o. G69P1001 female at [redacted]w[redacted]d with an Estimated Date of Delivery: 10/25/20 being seen today for ongoing management of a high-risk pregnancy complicated by diabetes mellitus A2DM currently on metformin 1,000mg  BID, glyburide 2.5mg  PM.  Today she reports FBS 83-98 (2 >95), 2hr pp 89-134 (4 >120). Lots of pressure.  Depression screen [facility] 2/9 08/04/2020 07/27/2020 04/27/2020  Decreased Interest 0 0 0  Down, Depressed, Hopeless 0 0 0  PHQ - 2 Score 0 0 0  Altered sleeping - 1 1  Tired, decreased energy - 1 1  Change in appetite - 0 0  Feeling bad or failure about yourself  - 0 0  Trouble concentrating - 0 0  Moving slowly or fidgety/restless - 0 0  Suicidal thoughts - 0 0  PHQ-9 Score - 2 2    Contractions: Irregular.  .  Movement: Present. denies leaking of fluid.  Review of Systems:   Pertinent items are noted in HPI Denies abnormal vaginal discharge w/ itching/odor/irritation, headaches, visual changes, shortness of breath, chest pain, abdominal pain, severe nausea/vomiting, or problems with urination or bowel movements unless otherwise stated above. Pertinent History Reviewed:  Reviewed past medical,surgical, social, obstetrical and family history.  Reviewed problem list, medications and allergies. Physical Assessment:   Vitals:   10/05/20 1608  BP: 117/70  Pulse: (!) 102  Weight: 278 lb (126.1 kg)  Body mass index is 49.25 kg/m.           Physical Examination:   General appearance: alert, well appearing, and in no distress  Mental status: alert, oriented to person, place, and time  Skin: warm & dry   Extremities: Edema: Trace    Cardiovascular: normal heart rate noted  Respiratory: normal respiratory effort, no distress  Abdomen: gravid, soft,  non-tender  Pelvic: Cervical exam performed  Dilation: 1 Effacement (%): 50 Station: -3  Fetal Status: Fetal Heart Rate (bpm): 135   Movement: Present Presentation: Vertex  Fetal Surveillance Testing today: NST: FHR baseline 135 bpm, Variability: moderate, Accelerations:present, Decelerations:  Absent= Cat 1/Reactive Toco: none     Chaperone: [name]    Results for orders placed or performed in visit on 10/05/20 (from the past 24 hour(s))  POC Urinalysis Dipstick OB   Collection Time: 10/05/20  4:16 PM  Result Value Ref Range   Color, UA     Clarity, UA     Glucose, UA Negative Negative   Bilirubin, UA     Ketones, UA large    Spec Grav, UA     Blood, UA 2    pH, UA     POC,PROTEIN,UA Negative Negative, Trace, Small (1+), Moderate (2+), Large (3+), 4+   Urobilinogen, UA     Nitrite, UA neg    Leukocytes, UA Negative Negative   Appearance     Odor      Assessment & Plan:  High-risk pregnancy: G2P1001 at [redacted]w[redacted]d with an Estimated Date of Delivery: 10/25/20   1) A2DM, stable on metformin 1,000mg  BID, glyburide 2.5mg  PM  2) Suspected LGA, EFW 99.8%/3995g  3) Prev c/s> for RCS, needs scheduled  Meds: No orders of the defined types were placed in this encounter.   Labs/procedures today: nst, sve  Treatment Plan:  2x/wk nst, RCS @ 39wks  Reviewed: Term  labor symptoms and general obstetric precautions including but not limited to vaginal bleeding, contractions, leaking of fluid and fetal movement were reviewed in detail with the patient.  All questions were answered.   Follow-up: Return for Wed pm nst/nurse, then next Monday hrob w/ MD only.   Future Appointments  Date Time Provider [place]  10/07/2020  1:30 PM [facility] NURSE [facility]    Orders Placed This Encounter  Procedures  . POC Urinalysis Dipstick OB   Roma Schanz CNM, [facility] 10/05/2020 5:22 PM

## 2020-10-07 ENCOUNTER — Ambulatory Visit (INDEPENDENT_AMBULATORY_CARE_PROVIDER_SITE_OTHER): Payer: Self-pay | Admitting: *Deleted

## 2020-10-07 ENCOUNTER — Other Ambulatory Visit: Payer: Self-pay

## 2020-10-07 VITALS — BP 118/82 | HR 102

## 2020-10-07 DIAGNOSIS — O0993 Supervision of high risk pregnancy, unspecified, third trimester: Secondary | ICD-10-CM

## 2020-10-07 DIAGNOSIS — Z1389 Encounter for screening for other disorder: Secondary | ICD-10-CM

## 2020-10-07 DIAGNOSIS — Z3A37 37 weeks gestation of pregnancy: Secondary | ICD-10-CM

## 2020-10-07 DIAGNOSIS — O24419 Gestational diabetes mellitus in pregnancy, unspecified control: Secondary | ICD-10-CM

## 2020-10-07 DIAGNOSIS — Z331 Pregnant state, incidental: Secondary | ICD-10-CM

## 2020-10-07 LAB — POCT URINALYSIS DIPSTICK OB
Blood, UA: NEGATIVE
Glucose, UA: NEGATIVE
Leukocytes, UA: NEGATIVE
Nitrite, UA: NEGATIVE
POC,PROTEIN,UA: NEGATIVE

## 2020-10-07 NOTE — Progress Notes (Addendum)
   NURSE VISIT- NST  SUBJECTIVE:  Colleen Arnold is a 34 y.o. G2P1001 female at [redacted]w[redacted]d, here for a NST for pregnancy complicated by D2KG currently on Metformin.  She reports active fetal movement, contractions: none, vaginal bleeding: none, membranes: intact.   OBJECTIVE:  BP 118/82   Pulse (!) 102   LMP 12/24/2019   Appears well, no apparent distress  Results for orders placed or performed in visit on 10/07/20 (from the past 24 hour(s))  POC Urinalysis Dipstick OB   Collection Time: 10/07/20  1:47 PM  Result Value Ref Range   Color, UA     Clarity, UA     Glucose, UA Negative Negative   Bilirubin, UA     Ketones, UA large    Spec Grav, UA     Blood, UA neg    pH, UA     POC,PROTEIN,UA Negative Negative, Trace, Small (1+), Moderate (2+), Large (3+), 4+   Urobilinogen, UA     Nitrite, UA neg    Leukocytes, UA Negative Negative   Appearance     Odor      NST: FHR baseline 130 bpm, Variability: moderate, Accelerations:present, Decelerations:  Absent= Cat 1/Reactive Toco: none   ASSESSMENT: G2P1001 at [redacted]w[redacted]d with A2DM currently on Metformin NST reactive  PLAN: EFM strip reviewed by Knute Neu, CNM, [facility]   Recommendations: keep next appointment as scheduled    [name]  10/07/2020 2:19 PM   Chart reviewed for nurse visit. Agree with plan of care.  Roma Schanz, [place] 10/07/2020 3:09 PM

## 2020-10-12 ENCOUNTER — Other Ambulatory Visit: Payer: Self-pay

## 2020-10-12 ENCOUNTER — Ambulatory Visit (INDEPENDENT_AMBULATORY_CARE_PROVIDER_SITE_OTHER): Payer: Self-pay | Admitting: Obstetrics & Gynecology

## 2020-10-12 ENCOUNTER — Encounter: Payer: Self-pay | Admitting: Obstetrics & Gynecology

## 2020-10-12 VITALS — BP 129/80 | HR 101 | Wt 277.0 lb

## 2020-10-12 DIAGNOSIS — Z331 Pregnant state, incidental: Secondary | ICD-10-CM

## 2020-10-12 DIAGNOSIS — Z1389 Encounter for screening for other disorder: Secondary | ICD-10-CM

## 2020-10-12 DIAGNOSIS — Z3A38 38 weeks gestation of pregnancy: Secondary | ICD-10-CM

## 2020-10-12 DIAGNOSIS — O0993 Supervision of high risk pregnancy, unspecified, third trimester: Secondary | ICD-10-CM

## 2020-10-12 DIAGNOSIS — O24419 Gestational diabetes mellitus in pregnancy, unspecified control: Secondary | ICD-10-CM

## 2020-10-12 LAB — POCT URINALYSIS DIPSTICK OB
Glucose, UA: NEGATIVE
Leukocytes, UA: NEGATIVE
Nitrite, UA: NEGATIVE

## 2020-10-12 NOTE — Progress Notes (Signed)
HIGH-RISK PREGNANCY VISIT Patient name: Colleen Arnold MRN [id number]  Date of birth: [birth year] Chief Complaint:   High Risk Gestation (NST)  History of Present Illness:   Colleen Arnold is a 34 y.o. G2P1001 female at [redacted]w[redacted]d with an Estimated Date of Delivery: 10/25/20 being seen today for ongoing management of a high-risk pregnancy complicated by A2DM.  Today she reports backache.  Depression screen [facility] 2/9 08/04/2020 07/27/2020 04/27/2020  Decreased Interest 0 0 0  Down, Depressed, Hopeless 0 0 0  PHQ - 2 Score 0 0 0  Altered sleeping - 1 1  Tired, decreased energy - 1 1  Change in appetite - 0 0  Feeling bad or failure about yourself  - 0 0  Trouble concentrating - 0 0  Moving slowly or fidgety/restless - 0 0  Suicidal thoughts - 0 0  PHQ-9 Score - 2 2    Contractions: Irregular. Vag. Bleeding: None.  Movement: Present. denies leaking of fluid.  Review of Systems:   Pertinent items are noted in HPI Denies abnormal vaginal discharge w/ itching/odor/irritation, headaches, visual changes, shortness of breath, chest pain, abdominal pain, severe nausea/vomiting, or problems with urination or bowel movements unless otherwise stated above. Pertinent History Reviewed:  Reviewed past medical,surgical, social, obstetrical and family history.  Reviewed problem list, medications and allergies. Physical Assessment:   Vitals:   10/12/20 1624  BP: 129/80  Pulse: (!) 101  Weight: 277 lb (125.6 kg)  Body mass index is 49.07 kg/m.           Physical Examination:   General appearance: alert, well appearing, and in no distress  Mental status: alert, oriented to person, place, and time  Skin: warm & dry   Extremities: Edema: Trace    Cardiovascular: normal heart rate noted  Respiratory: normal respiratory effort, no distress  Abdomen: gravid, soft, non-tender  Pelvic: Cervical exam performed         Fetal Status:     Movement: Present    Fetal Surveillance Testing today: Reactive  NST  Colleen Arnold is at [redacted]w[redacted]d Estimated Date of Delivery: 10/25/20  NST being performed due to A2 DM  Today the NST is Reactive  Fetal Monitoring:  Baseline: 140 bpm, Variability: Good {> 6 bpm), Accelerations: Reactive and Decelerations: Absent   reactive  The accelerations are >15 bpm and more than 2 in 20 minutes  Final diagnosis:  Reactive NST  Lazaro Arms, MD      Chaperone: N/A    Results for orders placed or performed in visit on 10/12/20 (from the past 24 hour(s))  POC Urinalysis Dipstick OB   Collection Time: 10/12/20  4:22 PM  Result Value Ref Range   Color, UA     Clarity, UA     Glucose, UA Negative Negative   Bilirubin, UA     Ketones, UA 2+    Spec Grav, UA     Blood, UA 2+    pH, UA     POC,PROTEIN,UA Trace Negative, Trace, Small (1+), Moderate (2+), Large (3+), 4+   Urobilinogen, UA     Nitrite, UA neg    Leukocytes, UA Negative Negative   Appearance     Odor      Assessment & Plan:  High-risk pregnancy: G2P1001 at [redacted]w[redacted]d with an Estimated Date of Delivery: 10/25/20   1) A2DM, good control on metformin 1000 bid + GLYBURIDE AT BEDTIME, stable  2) pREVIOUS c SECTION WITH SUSPECTED FETAL MACROSOMIA, repeat section 10/21/20  Meds: No  orders of the defined types were placed in this encounter.   Labs/procedures today: NST  Treatment Plan:  NST 3 days  Reviewed: Term labor symptoms and general obstetric precautions including but not limited to vaginal bleeding, contractions, leaking of fluid and fetal movement were reviewed in detail with the patient.  All questions were answered.  home bp cuff. Rx faxed to . Check bp weekly, let us know if >140/90.   Follow-up: No follow-ups on file.   No future appointments.  Orders Placed This Encounter  Procedures  . POC Urinalysis Dipstick OB   Colleen Arnold  10/12/2020 5:02 PM

## 2020-10-13 ENCOUNTER — Encounter (HOSPITAL_COMMUNITY): Payer: Self-pay

## 2020-10-13 NOTE — Patient Instructions (Signed)
Agata Lucente  10/13/2020   Your procedure is scheduled on:  10/21/2020  Arrive at 3:30 PM at Entrance C on [address] at [facility]  and [employer]. You are invited to use the FREE valet parking or use the Visitor's parking deck.  Pick up the phone at the desk and dial [phone].  Call this number if you have problems the morning of surgery: [phone]  Remember:   Do not eat food:(After Midnight) Desps de medianoche.  Do not drink clear liquids: (6 Hours before arrival) 6 horas ante llegada.  Take these medicines the morning of surgery with A SIP OF WATER:  none   Do not wear jewelry, make-up or nail polish.  Do not wear lotions, powders, or perfumes. Do not wear deodorant.  Do not shave 48 hours prior to surgery.  Do not bring valuables to the hospital.  [facility] is not   responsible for any belongings or valuables brought to the hospital.  Contacts, dentures or bridgework may not be worn into surgery.  Leave suitcase in the car. After surgery it may be brought to your room.  For patients admitted to the hospital, checkout time is 11:00 AM the day of              discharge.      Please read over the following fact sheets that you were given:     Preparing for Surgery

## 2020-10-14 ENCOUNTER — Telehealth: Payer: Self-pay | Admitting: Women's Health

## 2020-10-14 NOTE — Telephone Encounter (Signed)
Patient called and stated that she is having pressure and possible contractions and wants to know if she should go to the hospital. Clinical staff was informed and will follow up.

## 2020-10-14 NOTE — Telephone Encounter (Signed)
Spoke with patient she states that she had pressure and contractions since 2 am. They are not regular. Baby is moving well. Patient states that she has been drinking water and took tylenol and that has helped. Recommended that patient monitor symptoms and if she develops regular contractions 5 min or less apart for 1 hour to go to MAU or if baby is not moving, leaking of fluid or vaginal bleeding. Patient agreeable and will keep appointment for tomorrow.

## 2020-10-15 ENCOUNTER — Ambulatory Visit: Payer: Self-pay | Admitting: *Deleted

## 2020-10-15 ENCOUNTER — Other Ambulatory Visit: Payer: Self-pay

## 2020-10-15 ENCOUNTER — Other Ambulatory Visit: Payer: Self-pay | Admitting: *Deleted

## 2020-10-15 ENCOUNTER — Other Ambulatory Visit: Payer: Self-pay | Admitting: Women's Health

## 2020-10-15 VITALS — BP 130/80 | HR 94

## 2020-10-15 DIAGNOSIS — Z331 Pregnant state, incidental: Secondary | ICD-10-CM

## 2020-10-15 DIAGNOSIS — Z1389 Encounter for screening for other disorder: Secondary | ICD-10-CM

## 2020-10-15 DIAGNOSIS — Z3A38 38 weeks gestation of pregnancy: Secondary | ICD-10-CM

## 2020-10-15 DIAGNOSIS — Z3483 Encounter for supervision of other normal pregnancy, third trimester: Secondary | ICD-10-CM

## 2020-10-15 LAB — POCT URINALYSIS DIPSTICK OB
Blood, UA: NEGATIVE
Glucose, UA: NEGATIVE
Ketones, UA: NEGATIVE
Leukocytes, UA: NEGATIVE
Nitrite, UA: NEGATIVE
POC,PROTEIN,UA: NEGATIVE

## 2020-10-15 MED ORDER — METFORMIN HCL 1000 MG PO TABS: 1000.0000 mg | ORAL_TABLET | Freq: Two times a day (BID) | ORAL | 3 refills | Status: DC

## 2020-10-15 NOTE — Progress Notes (Addendum)
   NURSE VISIT- NST  SUBJECTIVE:  Colleen Arnold is a 34 y.o. G2P1001 female at [redacted]w[redacted]d, here for a NST for pregnancy complicated by A2DM currently on Metformin and Glyburide.  She reports active fetal movement, contractions: irregular, every ? minutes, vaginal bleeding: none, membranes: intact.   OBJECTIVE:  BP 130/80   Pulse 94   LMP 12/24/2019   Appears well, no apparent distress  Results for orders placed or performed in visit on 10/15/20 (from the past 24 hour(s))  POC Urinalysis Dipstick OB   Collection Time: 10/15/20 10:29 AM  Result Value Ref Range   Color, UA     Clarity, UA     Glucose, UA Negative Negative   Bilirubin, UA     Ketones, UA neg    Spec Grav, UA     Blood, UA neg    pH, UA     POC,PROTEIN,UA Negative Negative, Trace, Small (1+), Moderate (2+), Large (3+), 4+   Urobilinogen, UA     Nitrite, UA neg    Leukocytes, UA Negative Negative   Appearance     Odor      NST: FHR baseline 130 bpm, Variability: moderate, Accelerations:present, Decelerations:  Absent= Cat 1/Reactive Toco: none   ASSESSMENT: G2P1001 at [redacted]w[redacted]d with A2DM currently on Metformin and Glyburide NST reactive  PLAN: EFM strip reviewed by Cathie Beams, CNM   Recommendations: keep next appointment as scheduled    [name]  10/15/2020 10:41 AM   Chart reviewed for nurse visit. Agree with plan of care.  Jacklyn Shell, [place] 10/15/2020 12:27 PM

## 2020-10-16 ENCOUNTER — Encounter (HOSPITAL_COMMUNITY): Payer: Self-pay | Admitting: Obstetrics & Gynecology

## 2020-10-16 ENCOUNTER — Inpatient Hospital Stay (HOSPITAL_COMMUNITY)
Admission: AD | Admit: 2020-10-16 | Discharge: 2020-10-16 | Disposition: A | Discharge status: Home / Self Care | Payer: Self-pay | Attending: Obstetrics & Gynecology | Admitting: Obstetrics & Gynecology

## 2020-10-16 DIAGNOSIS — Z3A38 38 weeks gestation of pregnancy: Secondary | ICD-10-CM | POA: Insufficient documentation

## 2020-10-16 DIAGNOSIS — O0993 Supervision of high risk pregnancy, unspecified, third trimester: Secondary | ICD-10-CM

## 2020-10-16 DIAGNOSIS — O479 False labor, unspecified: Secondary | ICD-10-CM

## 2020-10-16 DIAGNOSIS — O471 False labor at or after 37 completed weeks of gestation: Secondary | ICD-10-CM | POA: Insufficient documentation

## 2020-10-16 MED ORDER — MORPHINE SULFATE (PF) 4 MG/ML IV SOLN
2.0000 mg | Freq: Once | INTRAVENOUS | Status: AC
Start: 2020-10-16 — End: 2020-10-16
  Administered 2020-10-16: 2 mg via INTRAMUSCULAR
  Filled 2020-10-16: qty 1

## 2020-10-16 NOTE — MAU Note (Signed)
Colleen Arnold is a 34 y.o. at [redacted]w[redacted]d here in MAU reporting: contractions since 0300, states they are frequent. Also reporting white and green discharge ongoing for a week, states she has told her office about it and they said its normal. No clear leaking. No bleeding. +FM  Onset of complaint: today  Pain score: 10/10  Vitals:   10/16/20 0608  BP: 126/77  Pulse: 82  Resp: 20  Temp: 98.2 F (36.8 C)  SpO2: 99%     FHT: +FM  Lab orders placed from triage: none

## 2020-10-16 NOTE — MAU Provider Note (Addendum)
S: Ms. Colleen Arnold is a 34 y.o. G2P1001 at [redacted]w[redacted]d  who presents to MAU today for labor evaluation.     Cervical exam by RN:  Dilation: 1.5 Effacement (%): Thick Cervical Position: Posterior Station: -3 Presentation: Vertex Exam by:: n druebbisch rn  Fetal Monitoring: Baseline: 130 Variability: moderate Accelerations: multiple, reactive strip Decelerations: none Contractions: every 10 min  MDM Discussed patient with RN. NST reviewed.  Administered IM morphine 2mg  for therapeutic rest with 30 min of FHT prior to discharge home.  A: [facility] at [redacted]w[redacted]d  False labor  P: Discharge home Labor precautions and kick counts included in AVS Patient to follow-up for repeat Cesarean on 10/21/20. Patient may return to MAU as needed or when in labor   12/19/20, MD 10/16/2020 9:06 AM

## 2020-10-16 NOTE — Discharge Instructions (Signed)
Braxton Hicks Contractions °Contractions of the uterus can occur throughout pregnancy, but they are not always a sign that you are in labor. You may have practice contractions called Braxton Hicks contractions. These false labor contractions are sometimes confused with true labor. °What are Braxton Hicks contractions? °Braxton Hicks contractions are tightening movements that occur in the muscles of the uterus before labor. Unlike true labor contractions, these contractions do not result in opening (dilation) and thinning of the cervix. Toward the end of pregnancy (32-34 weeks), Braxton Hicks contractions can happen more often and may become stronger. These contractions are sometimes difficult to tell apart from true labor because they can be very uncomfortable. You should not feel embarrassed if you go to the hospital with false labor. °Sometimes, the only way to tell if you are in true labor is for your health care provider to look for changes in the cervix. The health care provider will do a physical exam and may monitor your contractions. If you are not in true labor, the exam should show that your cervix is not dilating and your water has not broken. °If there are no other health problems associated with your pregnancy, it is completely safe for you to be sent home with false labor. You may continue to have Braxton Hicks contractions until you go into true labor. °How to tell the difference between true labor and false labor °True labor °· Contractions last 30-70 seconds. °· Contractions become very regular. °· Discomfort is usually felt in the top of the uterus, and it spreads to the lower abdomen and low back. °· Contractions do not go away with walking. °· Contractions usually become more intense and increase in frequency. °· The cervix dilates and gets thinner. °False labor °· Contractions are usually shorter and not as strong as true labor contractions. °· Contractions are usually irregular. °· Contractions  are often felt in the front of the lower abdomen and in the groin. °· Contractions may go away when you walk around or change positions while lying down. °· Contractions get weaker and are shorter-lasting as time goes on. °· The cervix usually does not dilate or become thin. °Follow these instructions at home: ° °· Take over-the-counter and prescription medicines only as told by your health care provider. °· Keep up with your usual exercises and follow other instructions from your health care provider. °· Eat and drink lightly if you think you are going into labor. °· If Braxton Hicks contractions are making you uncomfortable: °? Change your position from lying down or resting to walking, or change from walking to resting. °? Sit and rest in a tub of warm water. °? Drink enough fluid to keep your urine pale yellow. Dehydration may cause these contractions. °? Do slow and deep breathing several times an hour. °· Keep all follow-up prenatal visits as told by your health care provider. This is important. °Contact a health care provider if: °· You have a fever. °· You have continuous pain in your abdomen. °Get help right away if: °· Your contractions become stronger, more regular, and closer together. °· You have fluid leaking or gushing from your vagina. °· You pass blood-tinged mucus (bloody show). °· You have bleeding from your vagina. °· You have low back pain that you never had before. °· You feel your baby’s head pushing down and causing pelvic pressure. °· Your baby is not moving inside you as much as it used to. °Summary °· Contractions that occur before labor are   called Braxton Hicks contractions, false labor, or practice contractions.  Braxton Hicks contractions are usually shorter, weaker, farther apart, and less regular than true labor contractions. True labor contractions usually become progressively stronger and regular, and they become more frequent.  Manage discomfort from Braxton Hicks contractions  by changing position, resting in a warm bath, drinking plenty of water, or practicing deep breathing. This information is not intended to replace advice given to you by your health care provider. Make sure you discuss any questions you have with your health care provider. Document Revised: 09/15/2017 Document Reviewed: 02/16/2017 Elsevier Patient Education  2020 [employer] Fetal Movement Counts Patient Name: ________________________________________________ Patient Due Date: ____________________ What is a fetal movement count?  A fetal movement count is the number of times that you feel your baby move during a certain amount of time. This may also be called a fetal kick count. A fetal movement count is recommended for every pregnant woman. You may be asked to start counting fetal movements as early as week 28 of your pregnancy. Pay attention to when your baby is most active. You may notice your baby's sleep and wake cycles. You may also notice things that make your baby move more. You should do a fetal movement count:  When your baby is normally most active.  At the same time each day. A good time to count movements is while you are resting, after having something to eat and drink. How do I count fetal movements? 1. Find a quiet, comfortable area. Sit, or lie down on your side. 2. Write down the date, the start time and stop time, and the number of movements that you felt between those two times. Take this information with you to your health care visits. 3. Write down your start time when you feel the first movement. 4. Count kicks, flutters, swishes, rolls, and jabs. You should feel at least 10 movements. 5. You may stop counting after you have felt 10 movements, or if you have been counting for 2 hours. Write down the stop time. 6. If you do not feel 10 movements in 2 hours, contact your health care provider for further instructions. Your health care provider may want to do additional tests  to assess your baby's well-being. Contact a health care provider if:  You feel fewer than 10 movements in 2 hours.  Your baby is not moving like he or she usually does. Date: ____________ Start time: ____________ Stop time: ____________ Movements: ____________ Date: ____________ Start time: ____________ Stop time: ____________ Movements: ____________ Date: ____________ Start time: ____________ Stop time: ____________ Movements: ____________ Date: ____________ Start time: ____________ Stop time: ____________ Movements: ____________ Date: ____________ Start time: ____________ Stop time: ____________ Movements: ____________ Date: ____________ Start time: ____________ Stop time: ____________ Movements: ____________ Date: ____________ Start time: ____________ Stop time: ____________ Movements: ____________ Date: ____________ Start time: ____________ Stop time: ____________ Movements: ____________ Date: ____________ Start time: ____________ Stop time: ____________ Movements: ____________ This information is not intended to replace advice given to you by your health care provider. Make sure you discuss any questions you have with your health care provider. Document Revised: 05/23/2019 Document Reviewed: 05/23/2019 Elsevier Patient Education  2020 Elsevier Inc.  

## 2020-10-19 ENCOUNTER — Inpatient Hospital Stay (HOSPITAL_COMMUNITY): Payer: Medicaid Other | Admitting: Anesthesiology

## 2020-10-19 ENCOUNTER — Inpatient Hospital Stay (HOSPITAL_COMMUNITY): Admission: RE | Admit: 2020-10-19 | Payer: Self-pay | Source: Ambulatory Visit | Admitting: Obstetrics & Gynecology

## 2020-10-19 ENCOUNTER — Other Ambulatory Visit: Payer: Self-pay | Admitting: Obstetrics & Gynecology

## 2020-10-19 ENCOUNTER — Inpatient Hospital Stay (HOSPITAL_COMMUNITY)
Admission: AD | Admit: 2020-10-19 | Discharge: 2020-10-21 | DRG: 788 | Disposition: A | Discharge status: Home / Self Care | Payer: Medicaid Other | Attending: Family Medicine | Admitting: Family Medicine

## 2020-10-19 ENCOUNTER — Encounter (HOSPITAL_COMMUNITY): Payer: Self-pay | Admitting: Family Medicine

## 2020-10-19 ENCOUNTER — Encounter (HOSPITAL_COMMUNITY)
Admission: AD | Disposition: A | Discharge status: Home / Self Care | Payer: Self-pay | Source: Home / Self Care | Attending: Family Medicine

## 2020-10-19 ENCOUNTER — Other Ambulatory Visit: Payer: Self-pay

## 2020-10-19 ENCOUNTER — Other Ambulatory Visit (HOSPITAL_COMMUNITY): Payer: Self-pay

## 2020-10-19 ENCOUNTER — Encounter (HOSPITAL_COMMUNITY)
Admission: RE | Admit: 2020-10-19 | Discharge: 2020-10-19 | Disposition: A | Discharge status: Home / Self Care | Payer: Self-pay | Source: Ambulatory Visit | Attending: Obstetrics & Gynecology | Admitting: Obstetrics & Gynecology

## 2020-10-19 DIAGNOSIS — Z3A39 39 weeks gestation of pregnancy: Secondary | ICD-10-CM

## 2020-10-19 DIAGNOSIS — Z98891 History of uterine scar from previous surgery: Secondary | ICD-10-CM | POA: Diagnosis not present

## 2020-10-19 DIAGNOSIS — O0993 Supervision of high risk pregnancy, unspecified, third trimester: Secondary | ICD-10-CM

## 2020-10-19 DIAGNOSIS — R8271 Bacteriuria: Secondary | ICD-10-CM | POA: Diagnosis present

## 2020-10-19 DIAGNOSIS — O99892 Other specified diseases and conditions complicating childbirth: Secondary | ICD-10-CM | POA: Diagnosis not present

## 2020-10-19 DIAGNOSIS — O34211 Maternal care for low transverse scar from previous cesarean delivery: Principal | ICD-10-CM | POA: Diagnosis present

## 2020-10-19 DIAGNOSIS — O09899 Supervision of other high risk pregnancies, unspecified trimester: Secondary | ICD-10-CM

## 2020-10-19 DIAGNOSIS — O24425 Gestational diabetes mellitus in childbirth, controlled by oral hypoglycemic drugs: Secondary | ICD-10-CM | POA: Diagnosis present

## 2020-10-19 DIAGNOSIS — O24419 Gestational diabetes mellitus in pregnancy, unspecified control: Secondary | ICD-10-CM

## 2020-10-19 DIAGNOSIS — Z8759 Personal history of other complications of pregnancy, childbirth and the puerperium: Secondary | ICD-10-CM | POA: Diagnosis not present

## 2020-10-19 DIAGNOSIS — D649 Anemia, unspecified: Secondary | ICD-10-CM | POA: Diagnosis not present

## 2020-10-19 DIAGNOSIS — O134 Gestational [pregnancy-induced] hypertension without significant proteinuria, complicating childbirth: Secondary | ICD-10-CM | POA: Diagnosis present

## 2020-10-19 DIAGNOSIS — O0990 Supervision of high risk pregnancy, unspecified, unspecified trimester: Secondary | ICD-10-CM

## 2020-10-19 DIAGNOSIS — O3483 Maternal care for other abnormalities of pelvic organs, third trimester: Secondary | ICD-10-CM | POA: Diagnosis present

## 2020-10-19 DIAGNOSIS — N839 Noninflammatory disorder of ovary, fallopian tube and broad ligament, unspecified: Secondary | ICD-10-CM | POA: Diagnosis present

## 2020-10-19 DIAGNOSIS — Z20822 Contact with and (suspected) exposure to covid-19: Secondary | ICD-10-CM | POA: Diagnosis present

## 2020-10-19 DIAGNOSIS — O26893 Other specified pregnancy related conditions, third trimester: Secondary | ICD-10-CM | POA: Diagnosis present

## 2020-10-19 DIAGNOSIS — Z2839 Other underimmunization status: Secondary | ICD-10-CM

## 2020-10-19 DIAGNOSIS — N736 Female pelvic peritoneal adhesions (postinfective): Secondary | ICD-10-CM | POA: Diagnosis not present

## 2020-10-19 DIAGNOSIS — N838 Other noninflammatory disorders of ovary, fallopian tube and broad ligament: Secondary | ICD-10-CM | POA: Diagnosis present

## 2020-10-19 DIAGNOSIS — O139 Gestational [pregnancy-induced] hypertension without significant proteinuria, unspecified trimester: Secondary | ICD-10-CM | POA: Diagnosis present

## 2020-10-19 DIAGNOSIS — O99891 Other specified diseases and conditions complicating pregnancy: Secondary | ICD-10-CM

## 2020-10-19 DIAGNOSIS — O9903 Anemia complicating the puerperium: Secondary | ICD-10-CM | POA: Diagnosis not present

## 2020-10-19 DIAGNOSIS — Z8632 Personal history of gestational diabetes: Secondary | ICD-10-CM | POA: Diagnosis not present

## 2020-10-19 DIAGNOSIS — Z283 Underimmunization status: Secondary | ICD-10-CM

## 2020-10-19 LAB — CBC
HCT: 35.8 % — ABNORMAL LOW (ref 36.0–46.0)
Hemoglobin: 11.8 g/dL — ABNORMAL LOW (ref 12.0–15.0)
MCH: 31 pg (ref 26.0–34.0)
MCHC: 33 g/dL (ref 30.0–36.0)
MCV: 94 fL (ref 80.0–100.0)
Platelets: 195 10*3/uL (ref 150–400)
RBC: 3.81 MIL/uL — ABNORMAL LOW (ref 3.87–5.11)
RDW: 14.2 % (ref 11.5–15.5)
WBC: 8.1 10*3/uL (ref 4.0–10.5)
nRBC: 0 % (ref 0.0–0.2)

## 2020-10-19 LAB — PROTEIN / CREATININE RATIO, URINE
Creatinine, Urine: 88.57 mg/dL
Protein Creatinine Ratio: 0.16 mg/mg{Cre} — ABNORMAL HIGH (ref 0.00–0.15)
Total Protein, Urine: 14 mg/dL

## 2020-10-19 LAB — COMPREHENSIVE METABOLIC PANEL
ALT: 28 U/L (ref 0–44)
AST: 45 U/L — ABNORMAL HIGH (ref 15–41)
Albumin: 2.6 g/dL — ABNORMAL LOW (ref 3.5–5.0)
Alkaline Phosphatase: 103 U/L (ref 38–126)
Anion gap: 12 (ref 5–15)
BUN: 11 mg/dL (ref 6–20)
CO2: 19 mmol/L — ABNORMAL LOW (ref 22–32)
Calcium: 8.9 mg/dL (ref 8.9–10.3)
Chloride: 104 mmol/L (ref 98–111)
Creatinine, Ser: 0.51 mg/dL (ref 0.44–1.00)
GFR, Estimated: >60 mL/min (ref >60)
Glucose, Bld: 83 mg/dL (ref 70–99)
Potassium: 4.5 mmol/L (ref 3.5–5.1)
Sodium: 135 mmol/L (ref 135–145)
Total Bilirubin: 0.3 mg/dL (ref 0.3–1.2)
Total Protein: 6.1 g/dL — ABNORMAL LOW (ref 6.5–8.1)

## 2020-10-19 LAB — RAPID HIV SCREEN (HIV 1/2 AB+AG)
HIV 1/2 Antibodies: NONREACTIVE
HIV-1 P24 Antigen - HIV24: NONREACTIVE

## 2020-10-19 LAB — RESP PANEL BY RT-PCR (FLU A&B, COVID) ARPGX2
Influenza A by PCR: NEGATIVE
Influenza B by PCR: NEGATIVE
SARS Coronavirus 2 by RT PCR: NEGATIVE

## 2020-10-19 LAB — GLUCOSE, CAPILLARY
Glucose-Capillary: 88 mg/dL (ref 70–99)
Glucose-Capillary: 103 mg/dL — ABNORMAL HIGH (ref 70–99)

## 2020-10-19 LAB — TYPE AND SCREEN
ABO/RH(D): O POS
Antibody Screen: NEGATIVE

## 2020-10-19 SURGERY — Surgical Case
Anesthesia: Spinal | Wound class: Clean Contaminated

## 2020-10-19 MED ORDER — SCOPOLAMINE 1 MG/3DAYS TD PT72
1.0000 | MEDICATED_PATCH | Freq: Once | TRANSDERMAL | Status: DC
  Administered 2020-10-19: 1.5 mg via TRANSDERMAL

## 2020-10-19 MED ORDER — FENTANYL CITRATE (PF) 100 MCG/2ML IJ SOLN
INTRAMUSCULAR | Status: AC
  Filled 2020-10-19: qty 2

## 2020-10-19 MED ORDER — FAMOTIDINE 20 MG IN NS 100 ML IVPB
20.0000 mg | Freq: Once | INTRAVENOUS | Status: AC
  Administered 2020-10-19: 20 mg via INTRAVENOUS
  Filled 2020-10-19: qty 100

## 2020-10-19 MED ORDER — DEXAMETHASONE SODIUM PHOSPHATE 4 MG/ML IJ SOLN
INTRAMUSCULAR | Status: DC | PRN
  Administered 2020-10-19: 4 mg via INTRAVENOUS

## 2020-10-19 MED ORDER — MENTHOL 3 MG MT LOZG: 1.0000 | LOZENGE | OROMUCOSAL | Status: DC | PRN

## 2020-10-19 MED ORDER — SODIUM CHLORIDE 0.9 % IV SOLN: INTRAVENOUS | Status: DC | PRN

## 2020-10-19 MED ORDER — NALBUPHINE HCL 10 MG/ML IJ SOLN: 5.0000 mg | Freq: Once | INTRAMUSCULAR | Status: DC | PRN

## 2020-10-19 MED ORDER — NALBUPHINE HCL 10 MG/ML IJ SOLN
10.0000 mg | Freq: Once | INTRAMUSCULAR | Status: AC
  Administered 2020-10-19: 10 mg via INTRAVENOUS
  Filled 2020-10-19: qty 1

## 2020-10-19 MED ORDER — NALOXONE HCL 0.4 MG/ML IJ SOLN: 0.4000 mg | INTRAMUSCULAR | Status: DC | PRN

## 2020-10-19 MED ORDER — NALBUPHINE HCL 10 MG/ML IJ SOLN: 5.0000 mg | INTRAMUSCULAR | Status: DC | PRN

## 2020-10-19 MED ORDER — DIPHENHYDRAMINE HCL 25 MG PO CAPS: 25.0000 mg | ORAL_CAPSULE | ORAL | Status: DC | PRN

## 2020-10-19 MED ORDER — LACTATED RINGERS IV BOLUS
1000.0000 mL | Freq: Once | INTRAVENOUS | Status: AC
  Administered 2020-10-19: 1000 mL via INTRAVENOUS

## 2020-10-19 MED ORDER — DIPHENHYDRAMINE HCL 50 MG/ML IJ SOLN: 12.5000 mg | INTRAMUSCULAR | Status: DC | PRN

## 2020-10-19 MED ORDER — PHENYLEPHRINE HCL-NACL 20-0.9 MG/250ML-% IV SOLN
INTRAVENOUS | Status: AC
  Filled 2020-10-19: qty 250

## 2020-10-19 MED ORDER — PRENATAL MULTIVITAMIN CH
1.0000 | ORAL_TABLET | Freq: Every day | ORAL | Status: DC
  Administered 2020-10-20: 1 via ORAL
  Filled 2020-10-19: qty 1

## 2020-10-19 MED ORDER — BUPIVACAINE IN DEXTROSE 0.75-8.25 % IT SOLN
INTRATHECAL | Status: DC | PRN
  Administered 2020-10-19: 12 mg via INTRATHECAL

## 2020-10-19 MED ORDER — OXYTOCIN-SODIUM CHLORIDE 30-0.9 UT/500ML-% IV SOLN
INTRAVENOUS | Status: AC
  Filled 2020-10-19: qty 500

## 2020-10-19 MED ORDER — COCONUT OIL OIL: 1.0000 "application " | TOPICAL_OIL | Status: DC | PRN

## 2020-10-19 MED ORDER — MORPHINE SULFATE (PF) 0.5 MG/ML IJ SOLN: INTRAMUSCULAR | Status: DC | PRN

## 2020-10-19 MED ORDER — ENOXAPARIN SODIUM 60 MG/0.6ML ~~LOC~~ SOLN
60.0000 mg | SUBCUTANEOUS | Status: DC
  Administered 2020-10-20 – 2020-10-21 (×2): 60 mg via SUBCUTANEOUS
  Filled 2020-10-19 (×2): qty 0.6

## 2020-10-19 MED ORDER — MORPHINE SULFATE (PF) 0.5 MG/ML IJ SOLN
INTRAMUSCULAR | Status: AC
  Filled 2020-10-19: qty 10

## 2020-10-19 MED ORDER — CLINDAMYCIN PHOSPHATE 900 MG/50ML IV SOLN
900.0000 mg | Freq: Once | INTRAVENOUS | Status: AC
  Administered 2020-10-19: 900 mg via INTRAVENOUS
  Filled 2020-10-19: qty 50

## 2020-10-19 MED ORDER — SENNOSIDES-DOCUSATE SODIUM 8.6-50 MG PO TABS
2.0000 | ORAL_TABLET | ORAL | Status: DC
  Administered 2020-10-20: 2 via ORAL
  Filled 2020-10-19: qty 2

## 2020-10-19 MED ORDER — ENOXAPARIN SODIUM 40 MG/0.4ML ~~LOC~~ SOLN: 40.0000 mg | SUBCUTANEOUS | Status: DC

## 2020-10-19 MED ORDER — OXYCODONE HCL 5 MG PO TABS: 5.0000 mg | ORAL_TABLET | Freq: Once | ORAL | Status: DC | PRN

## 2020-10-19 MED ORDER — WITCH HAZEL-GLYCERIN EX PADS: 1.0000 "application " | MEDICATED_PAD | CUTANEOUS | Status: DC | PRN

## 2020-10-19 MED ORDER — LACTATED RINGERS IV SOLN: INTRAVENOUS | Status: DC

## 2020-10-19 MED ORDER — ONDANSETRON HCL 4 MG/2ML IJ SOLN
INTRAMUSCULAR | Status: DC | PRN
  Administered 2020-10-19: 4 mg via INTRAVENOUS

## 2020-10-19 MED ORDER — CEFAZOLIN SODIUM-DEXTROSE 2-4 GM/100ML-% IV SOLN: 2.0000 g | INTRAVENOUS | Status: DC

## 2020-10-19 MED ORDER — ONDANSETRON HCL 4 MG/2ML IJ SOLN: 4.0000 mg | Freq: Once | INTRAMUSCULAR | Status: DC | PRN

## 2020-10-19 MED ORDER — DEXAMETHASONE SODIUM PHOSPHATE 4 MG/ML IJ SOLN
INTRAMUSCULAR | Status: AC
  Filled 2020-10-19: qty 1

## 2020-10-19 MED ORDER — HYDROMORPHONE HCL 1 MG/ML IJ SOLN
INTRAMUSCULAR | Status: AC
  Filled 2020-10-19: qty 0.5

## 2020-10-19 MED ORDER — DIBUCAINE (PERIANAL) 1 % EX OINT: 1.0000 "application " | TOPICAL_OINTMENT | CUTANEOUS | Status: DC | PRN

## 2020-10-19 MED ORDER — FENTANYL CITRATE (PF) 100 MCG/2ML IJ SOLN
INTRAMUSCULAR | Status: DC | PRN
  Administered 2020-10-19: 15 ug via INTRATHECAL

## 2020-10-19 MED ORDER — LACTATED RINGERS IV SOLN: INTRAVENOUS | Status: DC | PRN

## 2020-10-19 MED ORDER — DIPHENHYDRAMINE HCL 25 MG PO CAPS: 25.0000 mg | ORAL_CAPSULE | Freq: Four times a day (QID) | ORAL | Status: DC | PRN

## 2020-10-19 MED ORDER — POVIDONE-IODINE 10 % EX SWAB
2.0000 "application " | Freq: Once | CUTANEOUS | Status: AC
  Administered 2020-10-19: 2 via TOPICAL

## 2020-10-19 MED ORDER — OXYTOCIN-SODIUM CHLORIDE 30-0.9 UT/500ML-% IV SOLN
INTRAVENOUS | Status: DC | PRN
  Administered 2020-10-19: 30 [IU] via INTRAVENOUS

## 2020-10-19 MED ORDER — HYDROMORPHONE HCL 1 MG/ML IJ SOLN
0.2500 mg | INTRAMUSCULAR | Status: DC | PRN
  Administered 2020-10-19: 0.5 mg via INTRAVENOUS

## 2020-10-19 MED ORDER — HYDROCODONE-ACETAMINOPHEN 5-325 MG PO TABS
1.0000 | ORAL_TABLET | ORAL | Status: DC | PRN
  Administered 2020-10-20: 2 via ORAL
  Administered 2020-10-20: 1 via ORAL
  Administered 2020-10-21 (×2): 2 via ORAL
  Filled 2020-10-19 (×3): qty 2
  Filled 2020-10-19: qty 1

## 2020-10-19 MED ORDER — KETOROLAC TROMETHAMINE 30 MG/ML IJ SOLN
30.0000 mg | Freq: Four times a day (QID) | INTRAMUSCULAR | Status: AC
  Administered 2020-10-19 – 2020-10-20 (×2): 30 mg via INTRAVENOUS
  Filled 2020-10-19 (×2): qty 1

## 2020-10-19 MED ORDER — OXYTOCIN-SODIUM CHLORIDE 30-0.9 UT/500ML-% IV SOLN: 2.5000 [IU]/h | INTRAVENOUS | Status: AC

## 2020-10-19 MED ORDER — SIMETHICONE 80 MG PO CHEW
80.0000 mg | CHEWABLE_TABLET | ORAL | Status: DC | PRN
Start: 2020-10-19 — End: 2020-10-21

## 2020-10-19 MED ORDER — KETOROLAC TROMETHAMINE 30 MG/ML IJ SOLN
30.0000 mg | Freq: Once | INTRAMUSCULAR | Status: AC | PRN
  Administered 2020-10-19: 30 mg via INTRAVENOUS

## 2020-10-19 MED ORDER — FAMOTIDINE IN NACL 20-0.9 MG/50ML-% IV SOLN: 20.0000 mg | Freq: Once | INTRAVENOUS | Status: DC

## 2020-10-19 MED ORDER — CLINDAMYCIN PHOSPHATE 900 MG/50ML IV SOLN
INTRAVENOUS | Status: AC
  Filled 2020-10-19: qty 50

## 2020-10-19 MED ORDER — ONDANSETRON HCL 4 MG/2ML IJ SOLN
INTRAMUSCULAR | Status: AC
  Filled 2020-10-19: qty 2

## 2020-10-19 MED ORDER — GENTAMICIN SULFATE 40 MG/ML IJ SOLN
5.0000 mg/kg | Freq: Once | INTRAVENOUS | Status: AC
  Administered 2020-10-19: 410 mg via INTRAVENOUS
  Filled 2020-10-19 (×2): qty 10.25

## 2020-10-19 MED ORDER — PHENYLEPHRINE HCL-NACL 20-0.9 MG/250ML-% IV SOLN
INTRAVENOUS | Status: DC | PRN
  Administered 2020-10-19: 60 ug/min via INTRAVENOUS

## 2020-10-19 MED ORDER — NALBUPHINE HCL 10 MG/ML IJ SOLN
5.0000 mg | INTRAMUSCULAR | Status: DC | PRN
  Administered 2020-10-19: 5 mg via INTRAVENOUS
  Filled 2020-10-19: qty 1

## 2020-10-19 MED ORDER — SOD CITRATE-CITRIC ACID 500-334 MG/5ML PO SOLN
30.0000 mL | Freq: Once | ORAL | Status: AC
  Administered 2020-10-19: 30 mL via ORAL
  Filled 2020-10-19: qty 15

## 2020-10-19 MED ORDER — ONDANSETRON HCL 4 MG/2ML IJ SOLN: 4.0000 mg | Freq: Three times a day (TID) | INTRAMUSCULAR | Status: DC | PRN

## 2020-10-19 MED ORDER — SIMETHICONE 80 MG PO CHEW
80.0000 mg | CHEWABLE_TABLET | Freq: Three times a day (TID) | ORAL | Status: DC
  Administered 2020-10-19 – 2020-10-21 (×5): 80 mg via ORAL
  Filled 2020-10-19 (×5): qty 1

## 2020-10-19 MED ORDER — NALOXONE HCL 4 MG/10ML IJ SOLN
1.0000 ug/kg/h | INTRAVENOUS | Status: DC | PRN
  Filled 2020-10-19: qty 5

## 2020-10-19 MED ORDER — MORPHINE SULFATE (PF) 0.5 MG/ML IJ SOLN
INTRAMUSCULAR | Status: DC | PRN
  Administered 2020-10-19: .15 mg via EPIDURAL

## 2020-10-19 MED ORDER — SODIUM CHLORIDE 0.9% FLUSH: 3.0000 mL | INTRAVENOUS | Status: DC | PRN

## 2020-10-19 MED ORDER — OXYCODONE HCL 5 MG/5ML PO SOLN: 5.0000 mg | Freq: Once | ORAL | Status: DC | PRN

## 2020-10-19 MED ORDER — IBUPROFEN 800 MG PO TABS
800.0000 mg | ORAL_TABLET | Freq: Three times a day (TID) | ORAL | Status: DC
  Administered 2020-10-20 – 2020-10-21 (×4): 800 mg via ORAL
  Filled 2020-10-19 (×4): qty 1

## 2020-10-19 SURGICAL SUPPLY — 42 items
CHLORAPREP W/TINT 26ML (MISCELLANEOUS) ×2 IMPLANT
CLAMP CORD UMBIL (MISCELLANEOUS) IMPLANT
CLOTH BEACON ORANGE TIMEOUT ST (SAFETY) ×2 IMPLANT
DERMABOND ADVANCED (GAUZE/BANDAGES/DRESSINGS) ×2
DERMABOND ADVANCED .7 DNX12 (GAUZE/BANDAGES/DRESSINGS) ×2 IMPLANT
DRSG OPSITE POSTOP 4X10 (GAUZE/BANDAGES/DRESSINGS) ×2 IMPLANT
ELECT REM PT RETURN 9FT ADLT (ELECTROSURGICAL) ×2
ELECTRODE REM PT RTRN 9FT ADLT (ELECTROSURGICAL) ×1 IMPLANT
EXTRACTOR VACUUM BELL STYLE (SUCTIONS) IMPLANT
GLOVE BIOGEL PI IND STRL 7.0 (GLOVE) ×1 IMPLANT
GLOVE BIOGEL PI IND STRL 8 (GLOVE) ×1 IMPLANT
GLOVE BIOGEL PI INDICATOR 7.0 (GLOVE) ×1
GLOVE BIOGEL PI INDICATOR 8 (GLOVE) ×1
GLOVE ECLIPSE 8.0 STRL XLNG CF (GLOVE) ×2 IMPLANT
GOWN STRL REUS W/TWL LRG LVL3 (GOWN DISPOSABLE) ×4 IMPLANT
KIT ABG SYR 3ML LUER SLIP (SYRINGE) ×2 IMPLANT
NEEDLE HYPO 18GX1.5 BLUNT FILL (NEEDLE) ×2 IMPLANT
NEEDLE HYPO 22GX1.5 SAFETY (NEEDLE) ×2 IMPLANT
NEEDLE HYPO 25X5/8 SAFETYGLIDE (NEEDLE) ×2 IMPLANT
NS IRRIG 1000ML POUR BTL (IV SOLUTION) ×2 IMPLANT
PACK C SECTION WH (CUSTOM PROCEDURE TRAY) ×2 IMPLANT
PAD ABD 7.5X8 STRL (GAUZE/BANDAGES/DRESSINGS) ×2 IMPLANT
PAD OB MATERNITY 4.3X12.25 (PERSONAL CARE ITEMS) ×2 IMPLANT
PENCIL SMOKE EVAC W/HOLSTER (ELECTROSURGICAL) ×2 IMPLANT
RETAINER VISCERAL (MISCELLANEOUS) ×2 IMPLANT
RETRACTOR TRAXI PANNICULUS (MISCELLANEOUS) ×1 IMPLANT
RTRCTR C-SECT PINK 25CM LRG (MISCELLANEOUS) IMPLANT
SPONGE GAUZE 4X4 12PLY STER LF (GAUZE/BANDAGES/DRESSINGS) ×2 IMPLANT
SUT CHROMIC 0 CT 1 (SUTURE) ×2 IMPLANT
SUT MNCRL 0 VIOLET CTX 36 (SUTURE) ×2 IMPLANT
SUT MONOCRYL 0 CTX 36 (SUTURE) ×2
SUT PLAIN 2 0 (SUTURE)
SUT PLAIN 2 0 XLH (SUTURE) IMPLANT
SUT PLAIN ABS 2-0 CT1 27XMFL (SUTURE) IMPLANT
SUT VIC AB 0 CTX 36 (SUTURE) ×1
SUT VIC AB 0 CTX36XBRD ANBCTRL (SUTURE) ×1 IMPLANT
SUT VIC AB 4-0 KS 27 (SUTURE) IMPLANT
SYR 20CC LL (SYRINGE) ×4 IMPLANT
TOWEL OR 17X24 6PK STRL BLUE (TOWEL DISPOSABLE) ×2 IMPLANT
TRAXI PANNICULUS RETRACTOR (MISCELLANEOUS) ×1
TRAY FOLEY W/BAG SLVR 14FR LF (SET/KITS/TRAYS/PACK) IMPLANT
WATER STERILE IRR 1000ML POUR (IV SOLUTION) ×2 IMPLANT

## 2020-10-19 NOTE — Discharge Summary (Signed)
Postpartum Discharge Summary     Patient Name: Colleen Arnold DOB: 06/24/1986 MRN: [id number]  Date of admission: 10/19/2020 Delivery date:10/19/2020  Delivering provider: Clarnce Flock  Date of discharge: 10/21/2020  Admitting diagnosis: Previous cesarean section [Z98.891] Gestational hypertension [O13.9] Intrauterine pregnancy: [redacted]w[redacted]d     Secondary diagnosis:  Active Problems:   Ovarian mass, left   Supervision of high-risk pregnancy   Previous cesarean section   Rubella non-immune status, antepartum   Asymptomatic bacteriuria during pregnancy in second trimester   Gestational diabetes mellitus, class A2   Gestational hypertension  Additional problems: as noted above  Discharge diagnosis: Term Pregnancy Delivered, Gestational Hypertension and GDM A2                                              Post partum procedures:none Augmentation: N/A Complications: None  Hospital course: Sceduled C/S   35 y.o. yo G2P1001 at [redacted]w[redacted]d was admitted to the hospital 10/19/2020 for scheduled cesarean section with the following indication:Elective Repeat and new onset gestational hypertension at term.Delivery details are as follows: arrived to MAU for labor check, not dilated but found to have new elevated BP's and labs consistent with gestational hypertension.  Membrane Rupture Time/Date: 12:48 PM ,10/19/2020   Delivery Method:C-Section, Low Transverse  Details of operation can be found in separate operative note.  Patient had an uncomplicated postpartum course.  She is ambulating, tolerating a regular diet, passing flatus, and urinating well. Patient is discharged home in stable condition on  10/21/20        Newborn Data: Birth date:10/19/2020  Birth time:12:50 PM  Gender:Female  Living status:Living  Apgars:7 ,9  Weight:4159 g     Magnesium Sulfate received: No BMZ received: No Rhophylac:N/A MMR: offered prior to discharge T-DaP:Given prenatally Flu: Yes Transfusion:No  Physical exam   Vitals:   10/20/20 0757 10/20/20 1410 10/20/20 2222 10/21/20 0457  BP: (!) 92/55 (!) 105/53 (!) 112/52 (!) 106/53  Pulse: 81 73 72 86  Resp: $Remo'18 17 18 16  'WfHQb$ Temp: 97.6 F (36.4 C) 97.8 F (36.6 C) 98 F (36.7 C) 97.9 F (36.6 C)  TempSrc: Oral Oral Oral   SpO2: 97% 98%  98%  Weight:      Height:       General: alert, cooperative and no distress Lochia: appropriate Uterine Fundus: firm Incision: Healing well with no significant drainage, No significant erythema, Dressing is clean, dry, and intact DVT Evaluation: No LE edema or pain. Labs: Lab Results  Component Value Date   WBC 8.0 10/20/2020   HGB 9.6 (L) 10/20/2020   HCT 28.8 (L) 10/20/2020   MCV 94.4 10/20/2020   PLT 155 10/20/2020   CMP Latest Ref Rng & Units 10/19/2020  Glucose 70 - 99 mg/dL 83  BUN 6 - 20 mg/dL 11  Creatinine 0.44 - 1.00 mg/dL 0.51  Sodium 135 - 145 mmol/L 135  Potassium 3.5 - 5.1 mmol/L 4.5  Chloride 98 - 111 mmol/L 104  CO2 22 - 32 mmol/L 19(L)  Calcium 8.9 - 10.3 mg/dL 8.9  Total Protein 6.5 - 8.1 g/dL 6.1(L)  Total Bilirubin 0.3 - 1.2 mg/dL 0.3  Alkaline Phos 38 - 126 U/L 103  AST 15 - 41 U/L 45(H)  ALT 0 - 44 U/L 28   Edinburgh Score: Edinburgh Postnatal Depression Scale Screening Tool 10/19/2020  I have been able to laugh and  see the funny side of things. 0  I have looked forward with enjoyment to things. 1  I have blamed myself unnecessarily when things went wrong. 2  I have been anxious or worried for no good reason. 1  I have felt scared or panicky for no good reason. 1  Things have been getting on top of me. 0  I have been so unhappy that I have had difficulty sleeping. 1  I have felt sad or miserable. 0  I have been so unhappy that I have been crying. 0  The thought of harming myself has occurred to me. 0  Edinburgh Postnatal Depression Scale Total 6     After visit meds:  Allergies as of 10/21/2020      Reactions   Rocephin [ceftriaxone Sodium In Dextrose] Rash       Medication List    STOP taking these medications   Accu-Chek Guide Me w/Device Kit   Accu-Chek Guide test strip Generic drug: glucose blood   Accu-Chek Softclix Lancets lancets   Blood Pressure Cuff Misc   glyBURIDE 2.5 MG tablet Commonly known as: DIABETA   metFORMIN 1000 MG tablet Commonly known as: Glucophage   pantoprazole 20 MG tablet Commonly known as: Protonix     TAKE these medications   acetaminophen 325 MG tablet Commonly known as: Tylenol Take 2 tablets (650 mg total) by mouth every 6 (six) hours for 30 doses. What changed:   when to take this  reasons to take this   coconut oil Oil Apply 1 application topically as needed (nipple pain).   ferrous sulfate 325 (65 FE) MG tablet Take 1 tablet (325 mg total) by mouth every other day. Start taking on: October 22, 2020   ibuprofen 800 MG tablet Commonly known as: ADVIL Take 1 tablet (800 mg total) by mouth every 8 (eight) hours.   oxyCODONE 5 MG immediate release tablet Commonly known as: Roxicodone Take 1 tablet (5 mg total) by mouth every 6 (six) hours as needed for severe pain or breakthrough pain.   prenatal vitamin w/FE, FA 27-1 MG Tabs tablet Take 1 tablet by mouth daily at 12 noon.        Discharge home in stable condition Infant Feeding: Bottle and Breast Infant Disposition:home with mother Discharge instruction: per After Visit Summary and Postpartum booklet. Activity: Advance as tolerated. Pelvic rest for 6 weeks.  Diet: routine diet Future Appointments: Future Appointments  Date Time Provider Friendly  10/28/2020  1:50 PM Darlina Rumpf, CNM [facility]  11/23/2020  3:00 PM Roma Schanz, CNM [facility]   Follow up Visit:  Please schedule this patient for a In person postpartum visit in 6 weeks with the following provider: Any provider. Additional Postpartum F/U:2 hour GTT, Incision check 1 week and BP check 1 week  High risk pregnancy complicated by: GDM  and HTN Delivery mode:  C-Section, Low Transverse  Anticipated Birth Control:  Condoms and vasectomy   Gabrille Kilbride, Gildardo Cranker, MD OB Fellow, [facility] 10/21/2020 10:00 AM

## 2020-10-19 NOTE — Op Note (Signed)
Operative Note   Patient: Colleen Arnold  Date of Procedure: 10/19/2020  Procedure: Repeat Low Transverse Cesarean   Indications: previous uterine incision: low transverse and gestational hypertension  Pre-operative Diagnosis: PREVIOUS C-SECTION.   Post-operative Diagnosis: Same, gestational hypertension  TOLAC Candidate: No  Surgeon: Surgeon(s) and Role:    * Colleen Arnold, Colleen Needy, MD - Primary  Assistants: Dr. Leda Quail  An experienced assistant was required given the standard of surgical care given the complexity of the case.  This assistant was needed for exposure, dissection, suctioning, retraction, instrument exchange, assisting with delivery with administration of fundal pressure, and for overall help during the procedure.   Anesthesia: spinal  Anesthesiologist: Dr. Valma Cava  Antibiotics: Gentamicin and Clindamycin   Estimated Blood Loss: 575 ml   Total IV Fluids: 2100 ml  Urine Output: 100 cc OF clear urine  Specimens: none   Complications: no complications   Indications: Colleen Arnold is a 35 y.o. G2P1001 with an IUP [redacted]w[redacted]d presenting for unscheduled, urgent secondary to the indications listed above. Clinical course notable for: arrived to MAU for labor check, no cervical dilation but found to have new onset elevated blood pressures, labs consistent with gestational hypertension.  The risks of cesarean section discussed with the patient included but were not limited to: bleeding which may require transfusion or reoperation; infection which may require antibiotics; injury to bowel, bladder, ureters or other surrounding organs; injury to the fetus; need for additional procedures including hysterectomy in the event of a life-threatening hemorrhage; placental abnormalities with subsequent pregnancies, incisional problems, thromboembolic phenomenon and other postoperative/anesthesia complications. The patient concurred with the proposed plan, giving informed written consent for  the procedure. Patient has been NPO since last night she will remain NPO for procedure. Anesthesia and OR aware. Preoperative prophylactic antibiotics and SCDs ordered on call to the OR.   Findings: Viable infant in cephalic, OP presentation, body cord. Apgars 7 , 9 , . Weight 4159 g . Clear amniotic fluid with terminal meconium amniotic fluid. Normal placenta, three vessel cord. Normal uterus with mild adhesive disease on the left side, Normal bilateral fallopian tubes, Normal bilateral ovaries.  Procedure Details: A Time Out was held and the above information confirmed. The patient received intravenous antibiotics and had sequential compression devices applied to her lower extremities preoperatively. The patient was taken back to the operative suite where spinal anesthesia was administered. After induction of anesthesia, the patient was draped and prepped in the usual sterile manner and placed in a dorsal supine position with a leftward tilt. A low transverse was made with scalpel and carried down through the subcutaneous tissue to the fascia. Fascial incision was made and extended transversely. The fascia was separated from the underlying rectus tissue superiorly and inferiorly. The rectus muscles were separated in the midline bluntly and the peritoneum was entered bluntly. An Alexis retractor was placed to aid in visualization of the uterus. A bladder flap was not developed. A low transverse was made. The infant was successfully delivered from cephalic, OP presentation, the umbilical cord was clamped after 1 minute. Cord ph was not sent, and cord blood was obtained for evaluation. The placenta was removed Intact and appeared normal. The uterine incision was closed with running locked sutures of 0-Monocryl, and then a second imbricating layer was also placed with 0-Monocryl. Due to ongoing bleeding from the hysterotomy figure of eight sutures of 0 Monocryl were placed after which there was excellent  hemostasis.. The abdomen and the pelvis were cleared of all clot  and debris and the Jon Gills was removed. Hemostasis was confirmed on all surfaces.  The peritoneum was reapproximated using 2-0 vicryl . The fascia was then closed using 0 Vicryl in a running fashion. The subcutaneous layer was reapproximated with plain gut and the skin was closed with a 4-0 vicryl subcuticular stitch. The patient tolerated the procedure well. Sponge, lap, instrument and needle counts were correct x 2. She was taken to the recovery room in stable condition.  Disposition: PACU - hemodynamically stable.    Signed: Venora Maples, MD, MPH [facility])

## 2020-10-19 NOTE — Anesthesia Postprocedure Evaluation (Signed)
Anesthesia Post Note  Patient: Colleen Arnold  Procedure(s) Performed: REPEAT CESAREAN SECTION (N/A )     Patient location during evaluation: Mother Baby Anesthesia Type: Spinal Level of consciousness: oriented and awake and alert Pain management: pain level controlled Vital Signs Assessment: post-procedure vital signs reviewed and stable Respiratory status: spontaneous breathing and respiratory function stable Cardiovascular status: blood pressure returned to baseline and stable Postop Assessment: no headache, no backache, no apparent nausea or vomiting and able to ambulate Anesthetic complications: no   No complications documented.  Last Vitals:  Vitals:   10/19/20 1445 10/19/20 1457  BP: 123/68 130/70  Pulse: 88 85  Resp: 18 20  Temp:  36.8 C  SpO2: 96%     Last Pain:  Vitals:   10/19/20 1457  TempSrc: Oral  PainSc: 0-No pain   Pain Goal:    LLE Motor Response: Purposeful movement (10/19/20 1430)   RLE Motor Response: Purposeful movement (10/19/20 1430)       Epidural/Spinal Function Cutaneous sensation: Tingles (10/19/20 1457), Patient able to flex knees: Yes (10/19/20 1457), Patient able to lift hips off bed: No (10/19/20 1457), Back pain beyond tenderness at insertion site: No (10/19/20 1457), Progressively worsening motor and/or sensory loss: No (10/19/20 1457), Bowel and/or bladder incontinence post epidural: No (10/19/20 1457)  Trevor Iha

## 2020-10-19 NOTE — MAU Provider Note (Signed)
Event Date/Time  First Provider Initiated Contact with Patient 10/19/20 1014     S: Ms. Colleen Arnold is a 35 y.o. G2P1001 at [redacted]w[redacted]d  who presents to MAU today complaining contractions q 2-5 minutes since Sunday 10/18/2020. She endorses vaginal bleeding. She denies LOF. She reports normal fetal movement.    O: BP (!) 145/98   Pulse 96   Temp 98.6 F (37 C) (Oral)   Resp 20   Ht 5\' 4"  (1.626 m)   Wt 125.2 kg   LMP 12/24/2019   SpO2 99%   BMI 47.38 kg/m  GENERAL: Well-developed, well-nourished female in no acute distress.  HEAD: Normocephalic, atraumatic.  CHEST: Normal effort of breathing, regular heart rate ABDOMEN: Soft, nontender, gravid  Cervical exam:  Dilation: Fingertip Effacement (%): 50 Cervical Position: Posterior Station: -3 Exam by:: jolynn   Fetal Monitoring: Baseline: 135 Variability: Mod Accelerations: 15 x 15 Decelerations: None Contractions: Irregular q 4-7 min  Patient Vitals for the past 24 hrs:  BP Temp Temp src Pulse Resp SpO2 Height Weight  10/19/20 1101 123/73 -- -- 93 -- -- -- --  10/19/20 1046 (!) 145/72 -- -- 83 -- -- -- --  10/19/20 1018 131/80 -- -- 82 -- -- -- --  10/19/20 1001 (!) 145/64 -- -- 82 -- 98 % -- --  10/19/20 0925 -- -- -- -- -- 99 % -- --  10/19/20 0919 (!) 145/98 -- -- 96 -- -- -- --  10/19/20 0900 -- -- -- -- -- 98 % -- --  10/19/20 0858 133/68 -- -- 93 -- -- -- --  10/19/20 0856 (!) 161/123 -- -- 96 -- -- -- --  10/19/20 0835 -- -- -- -- -- 99 % -- --  10/19/20 0832 140/86 98.6 F (37 C) Oral 100 20 -- -- --  10/19/20 0825 -- -- -- -- -- 99 % -- --  10/19/20 0822 -- -- -- -- -- -- 5\' 4"  (1.626 m) 276 lb (125.2 kg)    A: SIUP at [redacted]w[redacted]d  A2DM on Metformin and Glyburide Hx scheduled cesarean for fetal macrosomia in 2009 For repeat cesarean, NPO since last night EFW 99.8%, extrapolates to > 4500g at 39 weeks FT/thick/posterior on initial exam by RN New onset elevated BP, will cycle pressures, send P:Cr, prep for  OR Discussed with Dr. [redacted]w[redacted]d, who is in agreement with plan CNM at bedside to discuss plan of care with patient and husband Nubain ordered for increasing pain score   P: Prep for OR   2010, CNM 10/19/2020 11:05 AM

## 2020-10-19 NOTE — Transfer of Care (Signed)
Immediate Anesthesia Transfer of Care Note  Patient: Alvin Critchley  Procedure(s) Performed: REPEAT CESAREAN SECTION (N/A )  Patient Location: PACU  Anesthesia Type:Spinal  Level of Consciousness: awake, alert  and oriented  Airway & Oxygen Therapy: Patient Spontanous Breathing  Post-op Assessment: Report given to RN  Post vital signs: Reviewed and stable  Last Vitals:  Vitals Value Taken Time  BP 136/82 10/19/20 1345  Temp 36.8 C 10/19/20 1345  Pulse 89 10/19/20 1350  Resp 25 10/19/20 1350  SpO2 97 % 10/19/20 1350  Vitals shown include unvalidated device data.  Last Pain:  Vitals:   10/19/20 1345  TempSrc:   PainSc: 0-No pain         Complications: No complications documented.

## 2020-10-19 NOTE — Anesthesia Preprocedure Evaluation (Addendum)
Anesthesia Evaluation  Patient identified by MRN, date of birth, ID band Patient awake    Reviewed: Allergy & Precautions, NPO status , Patient's Chart, lab work & pertinent test results  Airway Mallampati: III  TM Distance: >3 FB Neck ROM: Full    Dental no notable dental hx. (+) Teeth Intact, Dental Advisory Given   Pulmonary  Covid PND   Pulmonary exam normal breath sounds clear to auscultation       Cardiovascular Exercise Tolerance: Good negative cardio ROS Normal cardiovascular exam Rhythm:Regular Rate:Normal     Neuro/Psych    GI/Hepatic negative GI ROS, Neg liver ROS,   Endo/Other  diabetes, Gestational  Renal/GU negative Renal ROS     Musculoskeletal negative musculoskeletal ROS (+)   Abdominal (+) + obese,   Peds  Hematology Lab Results      Component                Value               Date                      WBC                      8.1                 10/19/2020                HGB                      11.8 (L)            10/19/2020                HCT                      35.8 (L)            10/19/2020                MCV                      94.0                10/19/2020                PLT                      195                 10/19/2020             T& S pnd   Anesthesia Other Findings   Reproductive/Obstetrics (+) Pregnancy                             Anesthesia Physical Anesthesia Plan  ASA: III and emergent  Anesthesia Plan: Spinal   Post-op Pain Management:    Induction:   PONV Risk Score and Plan: Treatment may vary due to age or medical condition, Ondansetron and Dexamethasone  Airway Management Planned: Nasal Cannula and Natural Airway  Additional Equipment:   Intra-op Plan:   Post-operative Plan:   Informed Consent: I have reviewed the patients History and Physical, chart, labs and discussed the procedure including the risks, benefits and  alternatives for the proposed anesthesia with the patient or authorized representative who has  indicated his/her understanding and acceptance.     Dental advisory given and Interpreter used for interveiw  Plan Discussed with: CRNA and Anesthesiologist  Anesthesia Plan Comments: (39.1 wk g2P1 for repeat C/S)        Anesthesia Quick Evaluation

## 2020-10-19 NOTE — H&P (Signed)
LABOR AND DELIVERY ADMISSION HISTORY AND PHYSICAL NOTE  Colleen Arnold is a 35 y.o. female G2P1001 with IUP at [redacted]w[redacted]d by 9wk [place] presenting for labor check.   She reports positive fetal movement. She denies leakage of fluid or vaginal bleeding.  On arrival reporting painful contractions. Noted to have consistently elevated BP's, labs normal, c/w gestational HTN.   She plans on breast and bottle feeding. She requests vasectomy for birth control.  Prenatal History/Complications: PNC at [facility]  Sono:  $Remo'@[redacted]w[redacted]d'EaIBJ$ , CWD, normal anatomy, cephalic presentation, posterior placenta, 99%ile, EFW 3995 g  Pregnancy complications:  - GDMA2 - Rubella non immune - prior cesarean for macrosomia  Past Medical History: Past Medical History:  Diagnosis Date  . Bile duct stone   . Cholecystitis, acute with cholelithiasis   . Cyst, ovarian   . Elevated LFTs   . Gestational diabetes     Past Surgical History: Past Surgical History:  Procedure Laterality Date  . CESAREAN SECTION    . CHOLECYSTECTOMY    . ERCP N/A 10/06/2017   Procedure: ENDOSCOPIC RETROGRADE CHOLANGIOPANCREATOGRAPHY (ERCP);  Surgeon: Gatha Mayer, MD;  Location: [facility] ENDOSCOPY;  Service: Endoscopy;  Laterality: N/A;  . LAPAROSCOPIC UNILATERAL SALPINGO OOPHERECTOMY Left 11/28/2017   Procedure: LAPAROSCOPIC LEFT SALPINGO OOPHORECTOMY ;  Surgeon: Jonnie Kind, MD;  Location: [facility] ORS;  Service: Gynecology;  Laterality: Left;    Obstetrical History: OB History    Gravida  2   Para  1   Term  1   Preterm      AB      Living  1     SAB      IAB      Ectopic      Multiple      Live Births  1        Obstetric Comments  #1- "big baby"- scheduled        Social History: Social History   Socioeconomic History  . Marital status: Single    Spouse name: Not on file  . Number of children: 1  . Years of education: Not on file  . Highest education level: Not on file  Occupational History  . Occupation:  homemaker  Tobacco Use  . Smoking status: Never Smoker  . Smokeless tobacco: Never Used  Vaping Use  . Vaping Use: Never used  Substance and Sexual Activity  . Alcohol use: Not Currently  . Drug use: No  . Sexual activity: Yes    Birth control/protection: None  Other Topics Concern  . Not on file  Social History Narrative  . Not on file   Social Determinants of Health   Financial Resource Strain: High Risk  . Difficulty of Paying Living Expenses: Hard  Food Insecurity: Food Insecurity Present  . Worried About Charity fundraiser in the Last Year: Never true  . Ran Out of Food in the Last Year: Sometimes true  Transportation Needs: No Transportation Needs  . Lack of Transportation (Medical): No  . Lack of Transportation (Non-Medical): No  Physical Activity: Insufficiently Active  . Days of Exercise per Week: 2 days  . Minutes of Exercise per Session: 20 min  Stress: No Stress Concern Present  . Feeling of Stress : Not at all  Social Connections: Moderately Integrated  . Frequency of Communication with Friends and Family: More than three times a week  . Frequency of Social Gatherings with Friends and Family: Twice a week  . Attends Religious Services: More than 4 times  per year  . Active Member of Clubs or Organizations: No  . Attends Archivist Meetings: Never  . Marital Status: Living with partner    Family History: Family History  Problem Relation Age of Onset  . Hyperlipidemia Mother   . Diabetes Father   . Hypertension Brother   . Colon cancer Neg Hx     Allergies: Allergies  Allergen Reactions  . Rocephin [Ceftriaxone Sodium In Dextrose] Rash    Medications Prior to Admission  Medication Sig Dispense Refill Last Dose  . acetaminophen (TYLENOL) 325 MG tablet Take 2 tablets (650 mg total) by mouth every 6 (six) hours as needed for up to 30 doses for mild pain or fever. 30 tablet 0 Past Week at Unknown time  . glyBURIDE (DIABETA) 2.5 MG tablet Take  1 tablet (2.5 mg total) by mouth at bedtime. 30 tablet 3 10/18/2020 at Unknown time  . metFORMIN (GLUCOPHAGE) 1000 MG tablet Take 1 tablet (1,000 mg total) by mouth 2 (two) times daily with a meal. 30 tablet 3 10/18/2020 at Unknown time  . pantoprazole (PROTONIX) 20 MG tablet Take 1 tablet (20 mg total) by mouth daily. 30 tablet 6 10/18/2020 at Unknown time  . prenatal vitamin w/FE, FA (PRENATAL 1 + 1) 27-1 MG TABS tablet Take 1 tablet by mouth daily at 12 noon. 30 tablet 11 10/18/2020 at Unknown time  . Accu-Chek Softclix Lancets lancets Use as instructed to check blood sugar 4 times daily 100 each 12   . Blood Glucose Monitoring Suppl (ACCU-CHEK GUIDE ME) w/Device KIT 1 each by Does not apply route 4 (four) times daily. 1 kit 0   . Blood Pressure Monitoring (BLOOD PRESSURE CUFF) MISC 1 Device by Does not apply route daily. (Patient not taking: No sig reported) 1 each 0   . glucose blood (ACCU-CHEK GUIDE) test strip Use as instructed to check blood sugar 4 times daily 50 each 12      Review of Systems  All systems reviewed and negative except as stated in HPI  Physical Exam Blood pressure 118/71, pulse (!) 118, temperature 98.6 F (37 C), temperature source Oral, resp. rate 20, height $RemoveBe'5\' 4"'AOyEXeUJA$  (1.626 m), weight 125.2 kg, last menstrual period 12/24/2019, SpO2 98 %. General appearance: alert, oriented, NAD Lungs: normal respiratory effort Heart: regular rate Abdomen: soft, non-tender; gravid Extremities: No calf swelling or tenderness FHR: baseline 140, moderate variability, +accels, no decels, ctx q4-5 min, Cat I  Prenatal labs: ABO, Rh: --/--/O POS (01/03 1114) Antibody: NEG (01/03 1114) Rubella: <0.90 (07/12 1224) RPR: Non Reactive (10/11 0846)  HBsAg: Negative (07/12 1224)  HIV: Non Reactive (10/11 0846)  GC/Chlamydia: neg/neg  GBS: Negative/-- (12/13 1428)  2-hr GTT: abnormal Genetic screening:  Low risk NIPT Anatomy US: normal, macrosomia  Prenatal Transfer Tool  Maternal Diabetes:  Yes:  Diabetes Type:  Insulin/Medication controlled Genetic Screening: Normal Maternal Ultrasounds/Referrals: Normal Fetal Ultrasounds or other Referrals:  None Maternal Substance Abuse:  No Significant Maternal Medications:  Meds include: Other:  metformina, glyburide  Significant Maternal Lab Results: Group B Strep negative  Results for orders placed or performed during the hospital encounter of 10/19/20 (from the past 24 hour(s))  Resp Panel by RT-PCR (Flu A&B, Covid) Nasopharyngeal Swab   Collection Time: 10/19/20 10:18 AM   Specimen: Nasopharyngeal Swab; Nasopharyngeal(NP) swabs in vial transport medium  Result Value Ref Range   SARS Coronavirus 2 by RT PCR NEGATIVE NEGATIVE   Influenza A by PCR NEGATIVE NEGATIVE   Influenza B  by PCR NEGATIVE NEGATIVE  Protein / creatinine ratio, urine   Collection Time: 10/19/20 10:18 AM  Result Value Ref Range   Creatinine, Urine 88.57 mg/dL   Total Protein, Urine 14 mg/dL   Protein Creatinine Ratio 0.16 (H) 0.00 - 0.15 mg/mg[Cre]  CBC   Collection Time: 10/19/20 10:18 AM  Result Value Ref Range   WBC 8.1 4.0 - 10.5 K/uL   RBC 3.81 (L) 3.87 - 5.11 MIL/uL   Hemoglobin 11.8 (L) 12.0 - 15.0 g/dL   HCT 35.8 (L) 36.0 - 46.0 %   MCV 94.0 80.0 - 100.0 fL   MCH 31.0 26.0 - 34.0 pg   MCHC 33.0 30.0 - 36.0 g/dL   RDW 14.2 11.5 - 15.5 %   Platelets 195 150 - 400 K/uL   nRBC 0.0 0.0 - 0.2 %  Comprehensive metabolic panel   Collection Time: 10/19/20 10:18 AM  Result Value Ref Range   Sodium 135 135 - 145 mmol/L   Potassium 4.5 3.5 - 5.1 mmol/L   Chloride 104 98 - 111 mmol/L   CO2 19 (L) 22 - 32 mmol/L   Glucose, Bld 83 70 - 99 mg/dL   BUN 11 6 - 20 mg/dL   Creatinine, Ser 0.51 0.44 - 1.00 mg/dL   Calcium 8.9 8.9 - 10.3 mg/dL   Total Protein 6.1 (L) 6.5 - 8.1 g/dL   Albumin 2.6 (L) 3.5 - 5.0 g/dL   AST 45 (H) 15 - 41 U/L   ALT 28 0 - 44 U/L   Alkaline Phosphatase 103 38 - 126 U/L   Total Bilirubin 0.3 0.3 - 1.2 mg/dL   GFR, Estimated >60  >60 mL/min   Anion gap 12 5 - 15  Type and screen   Collection Time: 10/19/20 11:14 AM  Result Value Ref Range   ABO/RH(D) PENDING    Antibody Screen PENDING    Sample Expiration      10/22/2020,2359 Performed at [facility], [address] [place], [place] [address]   Glucose, capillary   Collection Time: 10/19/20 11:51 AM  Result Value Ref Range   Glucose-Capillary 88 70 - 99 mg/dL    Patient Active Problem List   Diagnosis Date Noted  . Gestational diabetes mellitus, class A2 07/30/2020  . Asymptomatic bacteriuria during pregnancy in second trimester 05/04/2020  . Rubella non-immune status, antepartum 04/28/2020  . Previous cesarean section 04/27/2020  . Supervision of high-risk pregnancy 04/16/2020  . Ovarian mass, left 10/06/2017  . Biliary obstruction 10/06/2017  . Choledocholithiasis with obstruction     Assessment: Colleen Arnold is a 35 y.o. G2P1001 at [redacted]w[redacted]d here for scheduled CS.  #RCS:  Patient scheduled for two days from now but presenting with new onset of elevated blood pressures, labs c/w gestational HTN.   The risks of cesarean section discussed with the patient included but were not limited to: bleeding which may require transfusion or reoperation; infection which may require antibiotics; injury to bowel, bladder, ureters or other surrounding organs; injury to the fetus; need for additional procedures including hysterectomy in the event of a life-threatening hemorrhage; placental abnormalities with subsequent pregnancies, incisional problems, thromboembolic phenomenon and other postoperative/anesthesia complications. The patient concurred with the proposed plan, giving informed written consent for the procedure. Patient has been NPO since last night she will remain NPO for procedure. Anesthesia and OR aware. Preoperative prophylactic antibiotics and SCDs ordered on call to the OR. To OR when ready.   #Anesthesia: Spinal #FWB: FHR Cat I #GBS/ID:  neg #COVID: swab neg on 10/19/2020 #MOF: breast and bottle #MOC: condoms (vasectomy?) #Circ: n/a  #gHTN: monitor BP's, will likely need anti-HTN meds after delivery  #GDMA2: check PP fasting glucose  #Rubella NI: MMR post partum  Clarnce Flock 10/19/2020, 12:11 PM

## 2020-10-19 NOTE — MAU Note (Signed)
OB or charge nurse and OB anesthesia called and report given

## 2020-10-19 NOTE — MAU Note (Signed)
Started contracting yesterday, called from desk 'wanting to push'.  Noted blood/mucous when used bathroom this morning.  Pt for c/s on 1/5

## 2020-10-19 NOTE — Anesthesia Procedure Notes (Signed)
Spinal  Patient location during procedure: OB Start time: 10/19/2020 12:17 PM End time: 10/19/2020 12:23 PM Staffing Performed: anesthesiologist  Anesthesiologist: Trevor Iha, MD Preanesthetic Checklist Completed: patient identified, IV checked, risks and benefits discussed, surgical consent, monitors and equipment checked, pre-op evaluation and timeout performed Spinal Block Patient position: sitting Prep: DuraPrep and site prepped and draped Patient monitoring: heart rate, cardiac monitor, continuous pulse ox and blood pressure Approach: midline Location: L3-4 Injection technique: single-shot Needle Needle type: Pencan  Needle gauge: 24 G Needle length: 10 cm Needle insertion depth: 8 cm Assessment Sensory level: T4 Additional Notes 2 Attempt (s). Pt tolerated procedure well.

## 2020-10-20 DIAGNOSIS — D649 Anemia, unspecified: Secondary | ICD-10-CM

## 2020-10-20 DIAGNOSIS — Z98891 History of uterine scar from previous surgery: Secondary | ICD-10-CM

## 2020-10-20 DIAGNOSIS — Z8759 Personal history of other complications of pregnancy, childbirth and the puerperium: Secondary | ICD-10-CM

## 2020-10-20 DIAGNOSIS — O9903 Anemia complicating the puerperium: Secondary | ICD-10-CM

## 2020-10-20 LAB — CBC
HCT: 28.8 % — ABNORMAL LOW (ref 36.0–46.0)
Hemoglobin: 9.6 g/dL — ABNORMAL LOW (ref 12.0–15.0)
MCH: 31.5 pg (ref 26.0–34.0)
MCHC: 33.3 g/dL (ref 30.0–36.0)
MCV: 94.4 fL (ref 80.0–100.0)
Platelets: 155 10*3/uL (ref 150–400)
RBC: 3.05 MIL/uL — ABNORMAL LOW (ref 3.87–5.11)
RDW: 14.4 % (ref 11.5–15.5)
WBC: 8 10*3/uL (ref 4.0–10.5)
nRBC: 0 % (ref 0.0–0.2)

## 2020-10-20 LAB — RPR: RPR Ser Ql: NONREACTIVE

## 2020-10-20 MED ORDER — FERROUS SULFATE 325 (65 FE) MG PO TABS
325.0000 mg | ORAL_TABLET | ORAL | Status: DC
  Administered 2020-10-20: 325 mg via ORAL
  Filled 2020-10-20: qty 1

## 2020-10-20 NOTE — Lactation Note (Signed)
This note was copied from a baby's chart. Lactation Consultation Note  Patient Name: Colleen Arnold DCVUD'T Date: 10/20/2020   Age:34 hours  Telephone call to RN, [name].  Left message to see if mom wants to see Lactation.    [name] 10/20/2020, 4:38 PM

## 2020-10-20 NOTE — Progress Notes (Addendum)
POSTPARTUM PROGRESS NOTE  Subjective: Colleen Arnold is a 35 y.o. G2P2002 s/p rLTCS at [redacted]w[redacted]d.  She reports she doing well. No acute events overnight. She denies any problems with ambulating or po intake. Denies nausea or vomiting. She has passed flatus. Pain is well controlled.  Lochia is minimal.  Objective: Blood pressure 115/63, pulse 86, temperature 98 F (36.7 C), temperature source Oral, resp. rate 18, height 5\' 4"  (1.626 m), weight 125.2 kg, last menstrual period 12/24/2019, SpO2 98 %, unknown if currently breastfeeding.  Physical Exam:  General: alert, cooperative and no distress Chest: no respiratory distress Abdomen: soft, non-tender  Uterine Fundus: firm and at level of umbilicus Extremities: No calf swelling or tenderness  no LE edema  Recent Labs    10/19/20 1018 10/20/20 0541  HGB 11.8* 9.6*  HCT 35.8* 28.8*    Assessment/Plan: Colleen Arnold is a 35 y.o. G2P2002 s/p s/p rLTCS at [redacted]w[redacted]d for gHTN.  Routine Postpartum Care: Doing well, pain well-controlled.  -- Continue routine care, lactation support  -- Contraception: condoms & vasectomy -- Feeding: breast & bottle -- gHTN: blood pressures low normal s/p delivery. Asymptomatic. Continue to monitor. -- Anemia: Hgb 9.6 down from 11.8 >start po iron  Dispo: Plan for discharge POD#2.  [redacted]w[redacted]d, MD OB Fellow, [facility] 10/20/2020 7:44 AM

## 2020-10-21 ENCOUNTER — Encounter (HOSPITAL_COMMUNITY): Payer: Self-pay | Admitting: Family Medicine

## 2020-10-21 DIAGNOSIS — Z8632 Personal history of gestational diabetes: Secondary | ICD-10-CM

## 2020-10-21 LAB — GLUCOSE, CAPILLARY: Glucose-Capillary: 105 mg/dL — ABNORMAL HIGH (ref 70–99)

## 2020-10-21 MED ORDER — IBUPROFEN 800 MG PO TABS: 800.0000 mg | ORAL_TABLET | Freq: Three times a day (TID) | ORAL | 0 refills | Status: DC

## 2020-10-21 MED ORDER — COCONUT OIL OIL: 1.0000 "application " | TOPICAL_OIL | 0 refills | Status: DC | PRN

## 2020-10-21 MED ORDER — OXYCODONE HCL 5 MG PO TABS: 5.0000 mg | ORAL_TABLET | Freq: Four times a day (QID) | ORAL | 0 refills | Status: DC | PRN

## 2020-10-21 MED ORDER — ACETAMINOPHEN 325 MG PO TABS: 650.0000 mg | ORAL_TABLET | Freq: Four times a day (QID) | ORAL | 0 refills | Status: AC

## 2020-10-21 MED ORDER — FERROUS SULFATE 325 (65 FE) MG PO TABS: 325.0000 mg | ORAL_TABLET | ORAL | 1 refills | Status: DC

## 2020-10-21 NOTE — Discharge Instructions (Signed)

## 2020-10-23 ENCOUNTER — Ambulatory Visit (INDEPENDENT_AMBULATORY_CARE_PROVIDER_SITE_OTHER): Payer: Self-pay | Admitting: *Deleted

## 2020-10-23 ENCOUNTER — Other Ambulatory Visit: Payer: Self-pay

## 2020-10-23 DIAGNOSIS — Z87898 Personal history of other specified conditions: Secondary | ICD-10-CM

## 2020-10-23 NOTE — Progress Notes (Signed)
   NURSE VISIT- Incision CHECK  SUBJECTIVE:  Colleen Arnold is a 35 y.o. G31P2002 female here for incision check. She is postpartum, delivery date 10/19/20. States "there is blood on the dressing".    OBJECTIVE:  There were no vitals taken for this visit.  Appearance alert, well appearing, and in no distress and oriented to person, place, and time.  ASSESSMENT: Postpartum incision check.  Some dark brown blood noted on dressing.  Advised to remove dressing on Monday as planned with [name] at visit.  PLAN: Discussed with Dr. Elonda Husky   Recommendations: no changes needed   Follow-up: as scheduled   [name]  10/23/2020 11:35 AM  Attestation of Attending Supervision of Nursing Visit Encounter: Evaluation and management procedures were performed by the nursing staff under my supervision and collaboration.  I have reviewed the nurse's note and chart, and I agree with the management and plan.  Jacelyn Grip MD Attending Physician for the [facility] 11/10/2020 9:14 AM

## 2020-10-26 ENCOUNTER — Encounter: Payer: Self-pay | Admitting: Women's Health

## 2020-10-26 ENCOUNTER — Ambulatory Visit (INDEPENDENT_AMBULATORY_CARE_PROVIDER_SITE_OTHER): Payer: Medicaid Other | Admitting: Women's Health

## 2020-10-26 ENCOUNTER — Other Ambulatory Visit: Payer: Self-pay

## 2020-10-26 VITALS — BP 120/74 | HR 82 | Ht 63.0 in | Wt 259.5 lb

## 2020-10-26 DIAGNOSIS — Z0130 Encounter for examination of blood pressure without abnormal findings: Secondary | ICD-10-CM

## 2020-10-26 DIAGNOSIS — Z4889 Encounter for other specified surgical aftercare: Secondary | ICD-10-CM

## 2020-10-26 NOTE — Progress Notes (Signed)
   GYN VISIT Patient name: Colleen Arnold MRN [id number]  Date of birth: [birth year] Chief Complaint:   Routine Post Op (BP check also; feels like a "boil" on left side)  History of Present Illness:   Colleen Arnold is a 35 y.o. G36P2002 Hispanic female 7d s/p RLTCS being seen today for bp and incision check. Feels like has a boil Lt side of abd. Bottlefeeding.     Depression screen [facility] 2/9 08/04/2020 07/27/2020 04/27/2020  Decreased Interest 0 0 0  Down, Depressed, Hopeless 0 0 0  PHQ - 2 Score 0 0 0  Altered sleeping - 1 1  Tired, decreased energy - 1 1  Change in appetite - 0 0  Feeling bad or failure about yourself  - 0 0  Trouble concentrating - 0 0  Moving slowly or fidgety/restless - 0 0  Suicidal thoughts - 0 0  PHQ-9 Score - 2 2    No LMP recorded. Review of Systems:   Pertinent items are noted in HPI Denies fever/chills, dizziness, headaches, visual disturbances, fatigue, shortness of breath, chest pain, abdominal pain, vomiting, abnormal vaginal discharge/itching/odor/irritation, problems with periods, bowel movements, urination, or intercourse unless otherwise stated above.  Pertinent History Reviewed:  Reviewed past medical,surgical, social, obstetrical and family history.  Reviewed problem list, medications and allergies. Physical Assessment:   Vitals:   10/26/20 1434  BP: 120/74  Pulse: 82  Weight: 259 lb 8 oz (117.7 kg)  Height: 5\' 3"  (1.6 m)  Body mass index is 45.97 kg/m.       Physical Examination:   General appearance: alert, well appearing, and in no distress  Mental status: alert, oriented to person, place, and time  Skin: warm & dry   Cardiovascular: normal heart rate noted  Respiratory: normal respiratory effort, no distress  Abdomen: soft, non-tender, honeycomb dressing removed, incision healing well, no s/s infection. No evidence of boil  Pelvic: examination not indicated  Extremities: no edema   Chaperone: N/A    No results found for this or  any previous visit (from the past 24 hour(s)).  Assessment & Plan:  1) 7d s/p RLTCS> incision healing well  2) BP check> bp normal  Meds: No orders of the defined types were placed in this encounter.   No orders of the defined types were placed in this encounter.   Return for As scheduled for pp visit.  [place], [facility] 10/26/2020 2:55 PM

## 2020-10-27 ENCOUNTER — Other Ambulatory Visit: Payer: Self-pay | Admitting: Obstetrics & Gynecology

## 2020-10-27 ENCOUNTER — Telehealth: Payer: Self-pay | Admitting: Women's Health

## 2020-10-27 MED ORDER — OXYCODONE HCL 5 MG PO TABS: 5.0000 mg | ORAL_TABLET | Freq: Four times a day (QID) | ORAL | 0 refills | Status: DC | PRN

## 2020-10-27 NOTE — Telephone Encounter (Signed)
Pt aware Percocet was refilled. [place]

## 2020-10-27 NOTE — Telephone Encounter (Signed)
Pt called to request refill on her Oxycodone Hcl 5mg  tab States it's from her recent c-section  Please advise & notify pt     [facility]

## 2020-10-27 NOTE — Telephone Encounter (Signed)
Percocet #15 refilled

## 2020-10-28 ENCOUNTER — Encounter: Payer: Self-pay | Admitting: Advanced Practice Midwife

## 2020-11-05 ENCOUNTER — Other Ambulatory Visit: Payer: Self-pay | Admitting: Women's Health

## 2020-11-05 MED ORDER — IBUPROFEN 800 MG PO TABS: 800.0000 mg | ORAL_TABLET | Freq: Three times a day (TID) | ORAL | 0 refills | Status: DC

## 2020-11-05 MED ORDER — AMOXICILLIN-POT CLAVULANATE 875-125 MG PO TABS: 1.0000 | ORAL_TABLET | Freq: Two times a day (BID) | ORAL | 0 refills | Status: DC

## 2020-11-23 ENCOUNTER — Other Ambulatory Visit: Payer: Self-pay

## 2020-11-23 ENCOUNTER — Ambulatory Visit (INDEPENDENT_AMBULATORY_CARE_PROVIDER_SITE_OTHER): Payer: Self-pay | Admitting: Women's Health

## 2020-11-23 ENCOUNTER — Encounter: Payer: Self-pay | Admitting: Women's Health

## 2020-11-23 DIAGNOSIS — Z30011 Encounter for initial prescription of contraceptive pills: Secondary | ICD-10-CM

## 2020-11-23 DIAGNOSIS — Z98891 History of uterine scar from previous surgery: Secondary | ICD-10-CM

## 2020-11-23 DIAGNOSIS — Z8632 Personal history of gestational diabetes: Secondary | ICD-10-CM

## 2020-11-23 MED ORDER — NORGESTIMATE-ETH ESTRADIOL 0.25-35 MG-MCG PO TABS: 1.0000 | ORAL_TABLET | Freq: Every day | ORAL | 3 refills | Status: DC

## 2020-11-23 NOTE — Patient Instructions (Signed)

## 2020-11-23 NOTE — Progress Notes (Signed)
POSTPARTUM VISIT Patient name: Colleen Arnold MRN [id number]  Date of birth: 1985-12-31 Chief Complaint:   Postpartum Care  History of Present Illness:   Colleen Arnold is a 35 y.o. G43P2002 Hispanic female being seen today for a postpartum visit. She is 5 weeks postpartum following a repeat cesarean section, low transverse incision at 39.1 gestational weeks. IOL: No, for n/a. Anesthesia: spinal.  Laceration: n/a.  Complications: none. Inpatient contraception: no.   Pregnancy complicated by A0OK, GHTN on admission. Tobacco use: no. Substance use disorder: no. Last pap smear: 11/11/19 at [facility] and results were neg. Next pap smear due: 2024 No LMP recorded.  Postpartum course has been uncomplicated. Bleeding no bleeding. Bowel function is some constipation. Bladder function is normal. Urinary incontinence? No, fecal incontinence? No Patient is not sexually active. Last sexual activity: prior to birth of baby.  Desired contraception: OCPs. Does not smoke, no h/o HTN, DVT/PE, CVA, MI, or migraines w/ aura. Patient does want a pregnancy in the future.  Desired family size is 3 children.   [place] - 11/23/20 1511      Pregnancy Intention Screening   Does the patient want to become pregnant in the next year? No    Does the patient's partner want to become pregnant in the next year? No    Would the patient like to discuss contraceptive options today? Yes      Contraception Wrap Up   Current Method No Method - Other Reason    End Method Oral Contraceptive    Contraception Counseling Provided Yes          The pregnancy intention screening data noted above was reviewed. Potential methods of contraception were discussed. The patient elected to proceed with Oral Contraceptive.   Edinburgh Postpartum Depression Screening: Negative  Edinburgh Postnatal Depression Scale - 11/23/20 1513      Edinburgh Postnatal Depression Scale:  In the Past 7 Days   I have been able to laugh and see the funny side  of things. 0    I have looked forward with enjoyment to things. 0    I have blamed myself unnecessarily when things went wrong. 2    I have been anxious or worried for no good reason. 0    I have felt scared or panicky for no good reason. 1    Things have been getting on top of me. 1    I have been so unhappy that I have had difficulty sleeping. 0    I have felt sad or miserable. 0    I have been so unhappy that I have been crying. 0    The thought of harming myself has occurred to me. 0    Edinburgh Postnatal Depression Scale Total 4          Baby's course has been uncomplicated. Baby is feeding by bottle. Infant has a pediatrician/family doctor? Yes.  Childcare strategy if returning to work/school: n/a.  Pt has material needs met for her and baby: Yes.   Review of Systems:   Pertinent items are noted in HPI Denies Abnormal vaginal discharge w/ itching/odor/irritation, headaches, visual changes, shortness of breath, chest pain, abdominal pain, severe nausea/vomiting, or problems with urination or bowel movements. Pertinent History Reviewed:  Reviewed past medical,surgical, obstetrical and family history.  Reviewed problem list, medications and allergies. OB History  Gravida Para Term Preterm AB Living  _0 SAB IAB Ectopic Multiple Live Births  0 2    # Outcome Date GA Lbr Len/2nd Weight Sex Delivery Anes PTL Lv  2 Term 10/19/20 [redacted]w[redacted]d 9 lb 2.7 oz (4.159 kg) F CS-LTranv Spinal  LIV  1 Term 07/19/08   8 lb 6 oz (3.799 kg) F CS-LTranv  N LIV    Obstetric Comments  #1- "big baby"- scheduled   Physical Assessment:   Vitals:   11/23/20 1506  BP: 137/63  Pulse: 64  Weight: 255 lb (115.7 kg)  Height: _0  (1.6 m)  Body mass index is 45.17 kg/m.       Physical Examination:   General appearance: alert, well appearing, and in no distress  Mental status: alert, oriented to person, place, and time  Skin: warm & dry   Cardiovascular: normal heart rate noted    Respiratory: normal respiratory effort, no distress   Breasts: deferred, no complaints   Abdomen: soft, non-tender, c/s incision well-healed   Pelvic: examination not indicated  Rectal: not examined  Extremities: no edema  Chaperone: N/A         No results found for this or any previous visit (from the past 24 hour(s)).  Assessment & Plan:  1) Postpartum exam 2) 5 wks s/p repeat cesarean section, low transverse incision 3) bottle feeding 4) Depression screening 5) Contraception management, rx sprintec (pt self-pay), f/u 377ms 6) Constipation> gave printed prevention/relief measures   Essential components of care per ACOG recommendations:  1.  Mood and well being:  . If positive depression screen, discussed and plan developed.  . If using tobacco we discussed reduction/cessation and risk of relapse . If current substance abuse, we discussed and referral to local resources was offered.   2. Infant care and feeding:  . If breastfeeding, discussed returning to work, pumping, breastfeeding-associated pain, guidance regarding return to fertility while lactating if not using another method. If needed, patient was provided with a letter to be allowed to pump q 2-3hrs to support lactation in a private location with access to a refrigerator to store breastmilk.   . Recommended that all caregivers be immunized for flu, pertussis and other preventable communicable diseases . If pt does not have material needs met for her/baby, referred to local resources for help obtaining these.  3. Sexuality, contraception and birth spacing . Provided guidance regarding sexuality, management of dyspareunia, and resumption of intercourse . Discussed avoiding interpregnancy interval <48m29m and recommended birth spacing of 18 months  4. Sleep and fatigue . Discussed coping options for fatigue and sleep disruption . Encouraged family/partner/community support of 4 hrs of uninterrupted sleep to help with mood  and fatigue  5. Physical recovery  . If pt had a C/S, assessed incisional pain and providing guidance on normal vs prolonged recovery . If pt had a laceration, perineal healing and pain reviewed.  . If urinary or fecal incontinence, discussed management and referred to PT or uro/gyn if indicated  . Patient is safe to resume physical activity. Discussed attainment of healthy weight.  6.  Chronic disease management . Discussed pregnancy complications if any, and their implications for future childbearing and long-term maternal health. . Review recommendations for prevention of recurrent pregnancy complications, such as 17 hydroxyprogesterone caproate to reduce risk for recurrent PTB not applicable, or aspirin to reduce risk of preeclampsia yes. . Pt had GDM: Yes. If yes, 2hr GTT scheduled: yes. . Reviewed medications and non-pregnant dosing including consideration of whether pt is breastfeeding using a reliable resource such as LactMed: not applicable .  Referred for f/u w/ PCP or subspecialist providers as indicated: not applicable  7. Health maintenance . Mammogram at 34yo or earlier if indicated . Pap smears as indicated  Meds:  Meds ordered this encounter  Medications  . norgestimate-ethinyl estradiol (ORTHO-CYCLEN) 0.25-35 MG-MCG tablet    Sig: Take 1 tablet by mouth daily.    Dispense:  90 tablet    Refill:  3    Order Specific Question:   Supervising Provider    Answer:   Florian Buff [[id number]]    Follow-up: Return for schedule 2hr sugar test (no visit), then 2mhs from now for med f/u in person.   No orders of the defined types were placed in this encounter.   [facility]2/04/2021 3:45 PM

## 2020-11-30 ENCOUNTER — Other Ambulatory Visit: Payer: Self-pay

## 2020-11-30 DIAGNOSIS — Z131 Encounter for screening for diabetes mellitus: Secondary | ICD-10-CM

## 2020-11-30 DIAGNOSIS — Z8759 Personal history of other complications of pregnancy, childbirth and the puerperium: Secondary | ICD-10-CM

## 2020-11-30 DIAGNOSIS — Z8632 Personal history of gestational diabetes: Secondary | ICD-10-CM

## 2020-12-01 ENCOUNTER — Other Ambulatory Visit: Payer: Self-pay | Admitting: Women's Health

## 2020-12-01 DIAGNOSIS — R7302 Impaired glucose tolerance (oral): Secondary | ICD-10-CM

## 2020-12-01 LAB — GLUCOSE TOLERANCE, 2 HOURS W/ 1HR
Glucose, 1 hour: 205 mg/dL — ABNORMAL HIGH (ref 65–179)
Glucose, 2 hour: 120 mg/dL (ref 65–152)
Glucose, Fasting: 114 mg/dL — ABNORMAL HIGH (ref 65–91)

## 2020-12-02 ENCOUNTER — Telehealth: Payer: Self-pay | Admitting: Women's Health

## 2020-12-02 NOTE — Telephone Encounter (Signed)
I spoke with patient regarding the resulted of her A1C level and informed her that it is a little elevated from the last time it was checked, Patient states that she think it might be because she had a little bit of coffee the night before. I informed patient that her next appointment to get her A1C checked will be be 03/01/2021 @ 8:[facility] am at [facility]. Patient acknowledge understanding.

## 2020-12-28 ENCOUNTER — Other Ambulatory Visit: Payer: Self-pay

## 2020-12-29 ENCOUNTER — Other Ambulatory Visit: Payer: Self-pay | Admitting: Obstetrics & Gynecology

## 2020-12-29 ENCOUNTER — Telehealth: Payer: Self-pay

## 2020-12-29 DIAGNOSIS — K219 Gastro-esophageal reflux disease without esophagitis: Secondary | ICD-10-CM

## 2020-12-29 MED ORDER — PANTOPRAZOLE SODIUM 20 MG PO TBEC: 20.0000 mg | DELAYED_RELEASE_TABLET | Freq: Every day | ORAL | 6 refills | Status: DC

## 2020-12-30 NOTE — Telephone Encounter (Signed)
Pt was called and informed

## 2021-02-11 ENCOUNTER — Other Ambulatory Visit: Payer: Self-pay

## 2021-02-11 ENCOUNTER — Encounter: Payer: Self-pay | Admitting: Women's Health

## 2021-02-11 ENCOUNTER — Ambulatory Visit (INDEPENDENT_AMBULATORY_CARE_PROVIDER_SITE_OTHER): Payer: Self-pay | Admitting: Women's Health

## 2021-02-11 VITALS — BP 131/88 | HR 71 | Ht 62.0 in | Wt 266.0 lb

## 2021-02-11 DIAGNOSIS — Z3041 Encounter for surveillance of contraceptive pills: Secondary | ICD-10-CM

## 2021-02-11 MED ORDER — NORETHIN ACE-ETH ESTRAD-FE 1-20 MG-MCG PO TABS: 1.0000 | ORAL_TABLET | Freq: Every day | ORAL | 3 refills | Status: DC

## 2021-02-11 NOTE — Progress Notes (Signed)
   GYN VISIT Patient name: Colleen Arnold MRN [id number]  Date of birth: [birth year] Chief Complaint:   Follow-up (On birth control pill; pt feels like she's gaining weight with pill)  History of Present Illness:   Colleen Arnold is a 35 y.o. G40P2002 Hispanic female being seen today for f/u on Sprintec rx'd 11/23/20.   Feels like she is gaining wt w/ pill. Has gained 11lb since 2/7.  Depression screen [facility] 2/9 08/04/2020 07/27/2020 04/27/2020  Decreased Interest 0 0 0  Down, Depressed, Hopeless 0 0 0  PHQ - 2 Score 0 0 0  Altered sleeping - 1 1  Tired, decreased energy - 1 1  Change in appetite - 0 0  Feeling bad or failure about yourself  - 0 0  Trouble concentrating - 0 0  Moving slowly or fidgety/restless - 0 0  Suicidal thoughts - 0 0  PHQ-9 Score - 2 2    Patient's last menstrual period was 02/10/2021. The current method of family planning is OCP (estrogen/progesterone).  Last pap 11/11/19. Results were: negative per pt report at [facility] Review of Systems:   Pertinent items are noted in HPI Denies fever/chills, dizziness, headaches, visual disturbances, fatigue, shortness of breath, chest pain, abdominal pain, vomiting, abnormal vaginal discharge/itching/odor/irritation, problems with periods, bowel movements, urination, or intercourse unless otherwise stated above.  Pertinent History Reviewed:  Reviewed past medical,surgical, social, obstetrical and family history.  Reviewed problem list, medications and allergies. Physical Assessment:   Vitals:   02/11/21 0924  BP: 131/88  Pulse: 71  Weight: 266 lb (120.7 kg)  Height: 5\' 2"  (1.575 m)  Body mass index is 48.65 kg/m.       Physical Examination:   General appearance: alert, well appearing, and in no distress  Mental status: alert, oriented to person, place, and time  Skin: warm & dry   Cardiovascular: normal heart rate noted  Respiratory: normal respiratory effort, no distress  Abdomen: soft, non-tender   Pelvic: examination  not indicated  Extremities: no edema   Chaperone: N/A    No results found for this or any previous visit (from the past 24 hour(s)).  Assessment & Plan:  1) Contraception surveillance> has gained 11lb on sprintec (rx'd this b/c she is self-pay). Discussed trying something different, but may be more expensive, wants to try. Rx generic Junel, gave GoodRx card  Meds:  Meds ordered this encounter  Medications  . norethindrone-ethinyl estradiol (JUNEL FE 1/20) 1-20 MG-MCG tablet    Sig: Take 1 tablet by mouth daily.    Dispense:  90 tablet    Refill:  3    Order Specific Question:   Supervising Provider    Answer:   Elonda Husky, LUTHER H [[id number]]    No orders of the defined types were placed in this encounter.   Return in about 1 year (around 02/11/2022) for Physical.  [place], [facility] 02/11/2021 9:54 AM

## 2021-02-22 ENCOUNTER — Ambulatory Visit: Payer: Self-pay | Admitting: Women's Health

## 2021-03-01 ENCOUNTER — Other Ambulatory Visit: Payer: Self-pay

## 2021-03-01 DIAGNOSIS — R7302 Impaired glucose tolerance (oral): Secondary | ICD-10-CM

## 2021-04-09 ENCOUNTER — Other Ambulatory Visit (HOSPITAL_COMMUNITY): Payer: Self-pay | Admitting: *Deleted

## 2021-04-09 ENCOUNTER — Other Ambulatory Visit: Payer: Self-pay | Admitting: *Deleted

## 2021-04-09 DIAGNOSIS — R748 Abnormal levels of other serum enzymes: Secondary | ICD-10-CM

## 2021-04-16 ENCOUNTER — Other Ambulatory Visit: Payer: Self-pay

## 2021-04-16 ENCOUNTER — Ambulatory Visit (HOSPITAL_COMMUNITY)
Admission: RE | Admit: 2021-04-16 | Discharge: 2021-04-16 | Disposition: A | Discharge status: Home / Self Care | Payer: Self-pay | Source: Ambulatory Visit | Attending: *Deleted | Admitting: *Deleted

## 2021-04-16 DIAGNOSIS — R748 Abnormal levels of other serum enzymes: Secondary | ICD-10-CM | POA: Insufficient documentation

## 2021-06-18 ENCOUNTER — Ambulatory Visit (INDEPENDENT_AMBULATORY_CARE_PROVIDER_SITE_OTHER): Payer: Self-pay | Admitting: Women's Health

## 2021-06-18 ENCOUNTER — Other Ambulatory Visit (HOSPITAL_COMMUNITY)
Admission: RE | Admit: 2021-06-18 | Discharge: 2021-06-18 | Disposition: A | Discharge status: Home / Self Care | Payer: Self-pay | Source: Ambulatory Visit | Attending: Women's Health | Admitting: Women's Health

## 2021-06-18 ENCOUNTER — Other Ambulatory Visit: Payer: Self-pay

## 2021-06-18 ENCOUNTER — Encounter: Payer: Self-pay | Admitting: Women's Health

## 2021-06-18 VITALS — BP 135/83 | HR 90 | Wt 265.0 lb

## 2021-06-18 DIAGNOSIS — Z124 Encounter for screening for malignant neoplasm of cervix: Secondary | ICD-10-CM | POA: Insufficient documentation

## 2021-06-18 DIAGNOSIS — Z113 Encounter for screening for infections with a predominantly sexual mode of transmission: Secondary | ICD-10-CM | POA: Insufficient documentation

## 2021-06-18 DIAGNOSIS — R1032 Left lower quadrant pain: Secondary | ICD-10-CM | POA: Insufficient documentation

## 2021-06-18 NOTE — Progress Notes (Signed)
   GYN VISIT Patient name: Colleen Arnold MRN [id number]  Date of birth: November 29, 1985 Chief Complaint:   Abdominal Pain (Left lower )  History of Present Illness:   Colleen Arnold is a 35 y.o. G30P2002 Hispanic female being seen today for report of LLQ pain x 28mh. Sharp and constant, worse w/ sex.  APAP helps some. Denies abnormal discharge, itching/odor/irritation.  BMs normal, 2-3x/day.  Periods regular on COCs until last month, has been on and off. Wants to get pregnant again soon.  Patient's last menstrual period was 06/01/2021. The current method of family planning is OCP (estrogen/progesterone).  Last pap she thinks about 340yrago. Results were:  normal  [facility] 08/04[facility]2020 07/27/2020 04/27/2020  Decreased Interest 0 0 0  Down, Depressed, Hopeless 0 0 0  PHQ - 2 Score 0 0 0  Altered sleeping - 1 1  Tired, decreased energy - 1 1  Change in appetite - 0 0  Feeling bad or failure about yourself  - 0 0  Trouble concentrating - 0 0  Moving slowly or fidgety/restless - 0 0  Suicidal thoughts - 0 0  PHQ-9 Score - 2 2     GAD 7 : Generalized Anxiety Score 07/27/2020 04/27/2020  Nervous, Anxious, on Edge 0 0  Control/stop worrying 1 0  Worry too much - different things 1 0  Trouble relaxing 0 0  Restless 0 0  Easily annoyed or irritable 0 0  Afraid - awful might happen 1 0  Total GAD 7 Score 3 0     Review of Systems:   Pertinent items are noted in HPI Denies fever/chills, dizziness, headaches, visual disturbances, fatigue, shortness of breath, chest pain, abdominal pain, vomiting, abnormal vaginal discharge/itching/odor/irritation, problems with periods, bowel movements, urination, or intercourse unless otherwise stated above.  Pertinent History Reviewed:  Reviewed past medical,surgical, social, obstetrical and family history.  Reviewed problem list, medications and allergies. Physical Assessment:   Vitals:   06/18/21 1157  BP: 135/83  Pulse: 90  Weight: 265  lb (120.2 kg)  Body mass index is 48.47 kg/m.       Physical Examination:   General appearance: alert, well appearing, and in no distress  Mental status: alert, oriented to person, place, and time  Skin: warm & dry   Cardiovascular: normal heart rate noted  Respiratory: normal respiratory effort, no distress  Abdomen: soft, non-tender   Pelvic: VULVA: normal appearing vulva with no masses, tenderness or lesions, VAGINA: normal appearing vagina with normal color and discharge, no lesions, CERVIX: normal appearing cervix without discharge or lesions, UTERUS: difficult to assess d/t habitus, ADNEXA, difficult to assess d/t habitus, diffuse tenderness bilaterally, L>R  Extremities: no edema   Chaperone: Latisha Cresenzo    No results found for this or any previous visit (from the past 24 hour(s)).  Assessment & Plan:  1) LLQ pain> due for pap, so pap obtained- will check gc/ct, and see if any bv/yeast/trich. If neg, will get u/s. Alternate apap/ibuprofen, heating pad. If becomes severe/meds not helping go to ED  Meds: No orders of the defined types were placed in this encounter.   No orders of the defined types were placed in this encounter.   Return for prn; will call w/ results.  [facility]onWH[facility]/11/2020 12:23 PM

## 2021-06-18 NOTE — Addendum Note (Signed)
Addended by: Octaviano Glow on: 06/18/2021 01:01 PM   Modules accepted: Orders

## 2021-06-24 ENCOUNTER — Telehealth: Payer: Self-pay | Admitting: Women's Health

## 2021-06-24 NOTE — Telephone Encounter (Signed)
Patient called to see if her results were in from her appointment on 06/18/2021.

## 2021-06-29 LAB — CYTOLOGY - PAP
Chlamydia: NEGATIVE
Comment: NEGATIVE
Comment: NEGATIVE
Comment: NORMAL
Diagnosis: NEGATIVE
High risk HPV: NEGATIVE
Neisseria Gonorrhea: NEGATIVE

## 2021-06-30 ENCOUNTER — Telehealth: Payer: Self-pay

## 2021-06-30 NOTE — Telephone Encounter (Signed)
Pt called wanting results of pap smear. Pt stated that she was still in a lot of pain and was planning on going to the hospital, but wanted to call us first. Two identifiers used. Informed pt that pap was normal and read her [name] Booker's msg asking if she wanted to do the pelvic US. Pt agreed and she was told we would call her back with more information. Pt confirmed understanding.

## 2021-06-30 NOTE — Telephone Encounter (Signed)
Pt called back to inquire about [place]. Knute Neu consulted, made aware of pt's desire for [place]. Pt c/o severe pain and she began period-like bleeding at approx 10 am, but not heavy. Pt advised to go to ED because she would not get an [place] scheduled for today, regardless. If pt gets an US done at the hospital, there shouldn't be a need for another. Pt instructed to call us tomorrow to schedule if one not done today at hospital. Pt confirmed understanding.

## 2021-07-12 ENCOUNTER — Other Ambulatory Visit: Payer: Self-pay | Admitting: Women's Health

## 2021-07-12 ENCOUNTER — Telehealth: Payer: Self-pay | Admitting: Women's Health

## 2021-07-12 DIAGNOSIS — R102 Pelvic and perineal pain: Secondary | ICD-10-CM

## 2021-07-12 NOTE — Telephone Encounter (Signed)
Patient called stating that she has not had an ultrasound done, patient would like to know if kim could schedule an [place] for her at the hospital or if she could make an appointment here in the office. Pt is self pay. Please send me a message back and I can call the patient with the information. She speaks spanish

## 2021-08-09 ENCOUNTER — Ambulatory Visit (INDEPENDENT_AMBULATORY_CARE_PROVIDER_SITE_OTHER): Payer: Self-pay | Admitting: Women's Health

## 2021-08-09 ENCOUNTER — Ambulatory Visit (INDEPENDENT_AMBULATORY_CARE_PROVIDER_SITE_OTHER): Payer: Self-pay

## 2021-08-09 ENCOUNTER — Other Ambulatory Visit: Payer: Self-pay

## 2021-08-09 ENCOUNTER — Encounter: Payer: Self-pay | Admitting: Women's Health

## 2021-08-09 VITALS — BP 116/74 | HR 68 | Ht 63.0 in | Wt 262.0 lb

## 2021-08-09 DIAGNOSIS — Z319 Encounter for procreative management, unspecified: Secondary | ICD-10-CM

## 2021-08-09 DIAGNOSIS — R102 Pelvic and perineal pain: Secondary | ICD-10-CM

## 2021-08-09 DIAGNOSIS — R1032 Left lower quadrant pain: Secondary | ICD-10-CM

## 2021-08-09 MED ORDER — PRENATAL PLUS 27-1 MG PO TABS: 1.0000 | ORAL_TABLET | Freq: Every day | ORAL | 3 refills | Status: DC

## 2021-08-09 NOTE — Progress Notes (Signed)
PELVIC US TA/TV: homogeneous anteverted uterus,wnl,EEC 7.1 mm,simple right ovarian cyst 2.9 x 2.8 x 2.2 cm,left oophorectomy,left adnexa WNL,no free fluid,right adnexal pain during ultrasound  Chaperone Murlean Iba

## 2021-08-09 NOTE — Progress Notes (Signed)
GYN VISIT Patient name: Colleen Arnold MRN [id number]  Date of birth: 13-Jul-1986 Chief Complaint:   discuss [place] results  History of Present Illness:   Colleen Arnold is a 35 y.o. G36P2002 Hispanic female being seen today for f/u after u/s done for LLQ pain, worse w/ sex. 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  Irregular period last 2 months. 06/18/21: pap neg, gc/ct neg, no mention of trich/bv/yeast on pap. Wants to get pregnant again soon, but husband wanted to make sure she was ok first.  Patient's last menstrual period was 07/22/2021. The current method of family planning is OCP (estrogen/progesterone).  Last pap 06/18/21. Results were: NILM w/ HRHPV negative  Depression screen [facility] 2/9 08/04/2020 07/27/2020 04/27/2020  Decreased Interest 0 0 0  Down, Depressed, Hopeless 0 0 0  PHQ - 2 Score 0 0 0  Altered sleeping - 1 1  Tired, decreased energy - 1 1  Change in appetite - 0 0  Feeling bad or failure about yourself  - 0 0  Trouble concentrating - 0 0  Moving slowly or fidgety/restless - 0 0  Suicidal thoughts - 0 0  PHQ-9 Score - 2 2     GAD 7 : Generalized Anxiety Score 07/27/2020 04/27/2020  Nervous, Anxious, on Edge 0 0  Control/stop worrying 1 0  Worry too much - different things 1 0  Trouble relaxing 0 0  Restless 0 0  Easily annoyed or irritable 0 0  Afraid - awful might happen 1 0  Total GAD 7 Score 3 0     Review of Systems:   Pertinent items are noted in HPI Denies fever/chills, dizziness, headaches, visual disturbances, fatigue, shortness of breath, chest pain, abdominal pain, vomiting, abnormal vaginal discharge/itching/odor/irritation, problems with periods, bowel movements, urination, or intercourse unless otherwise stated above.  Pertinent History Reviewed:  Reviewed past medical,surgical, social, obstetrical and family history.  Reviewed problem list, medications and allergies. Physical Assessment:   Vitals:   08/09/21 1143  BP:  116/74  Pulse: 68  Weight: 262 lb (118.8 kg)  Height: 5\' 3"  (1.6 m)  Body mass index is 46.41 kg/m.       Physical Examination:   General appearance: alert, well appearing, and in no distress  Mental status: alert, oriented to person, place, and time  Skin: warm & dry   Cardiovascular: normal heart rate noted  Respiratory: normal respiratory effort, no distress  Abdomen: soft, non-tender   Pelvic: examination not indicated  Extremities: no edema   Chaperone: N/A    Today's Pelvic U/S:  Colleen Arnold is a 35 y.o. [id number] Patient's last menstrual period was 07/22/2021. for a pelvic sonogram for left lower quadrant pain.   Uterus                      5.5 x 4.2 x 5.7 cm, Total uterine volume 68 cc, homogeneous anteverted uterus,wnl Endometrium          7.1 mm, symmetrical, wnl Right ovary             3.3 x 3.3 x 3.4 cm, simple right ovarian cyst 2.9 x 2.8 x 2.2 cm Left ovary                left oophorectomy,left adnexa WNL No free fluid  Technician Comments: PELVIC US TA/TV: homogeneous anteverted uterus,wnl,EEC 7.1 mm,simple right ovarian cyst 2.9 x 2.8 x 2.2 cm,left oophorectomy,left adnexa WNL,no free fluid,right adnexal pain during ultrasound Chaperone  Elvin So 08/09/2021 11:49 AM  No results found for this or any previous visit (from the past 24 hour(s)).  Assessment & Plan:  1) LLQ pain> normal pelvic u/s, has 2.9cm Rt simple ovarian cyst and Rt adnexal pain during u/s, otherwise normal. Discussed cyst is not cause of her LLQ pain. Could be scar tissue/adhesions from 2019 surgery, possible endometriosis, hard to tell for sure. Wants to get pregnant again, ok to go ahead and start trying. Rx pnv.   Meds:  Meds ordered this encounter  Medications   prenatal vitamin w/FE, FA (PRENATAL 1 + 1) 27-1 MG TABS tablet    Sig: Take 1 tablet by mouth daily at 12 noon.    Dispense:  90 tablet    Refill:  3    Order Specific Question:   Supervising Provider     Answer:   Elonda Husky, LUTHER H [[id number]]    No orders of the defined types were placed in this encounter.   Return in about 1 year (around 08/09/2022) for Physical.  [place], [facility] 08/09/2021 12:27 PM

## 2021-08-10 NOTE — Progress Notes (Signed)
Thanks for pointing out my error.  It has been amended

## 2021-09-13 ENCOUNTER — Encounter: Payer: Self-pay | Admitting: Women's Health

## 2021-09-13 ENCOUNTER — Other Ambulatory Visit (HOSPITAL_COMMUNITY)
Admission: RE | Admit: 2021-09-13 | Discharge: 2021-09-13 | Disposition: A | Discharge status: Home / Self Care | Payer: Self-pay | Source: Ambulatory Visit | Attending: Women's Health | Admitting: Women's Health

## 2021-09-13 ENCOUNTER — Ambulatory Visit (INDEPENDENT_AMBULATORY_CARE_PROVIDER_SITE_OTHER): Payer: Self-pay | Admitting: Women's Health

## 2021-09-13 ENCOUNTER — Other Ambulatory Visit: Payer: Self-pay

## 2021-09-13 VITALS — BP 136/88 | HR 74 | Ht 63.0 in | Wt 266.0 lb

## 2021-09-13 DIAGNOSIS — N898 Other specified noninflammatory disorders of vagina: Secondary | ICD-10-CM

## 2021-09-13 DIAGNOSIS — R102 Pelvic and perineal pain: Secondary | ICD-10-CM | POA: Insufficient documentation

## 2021-09-13 DIAGNOSIS — K5900 Constipation, unspecified: Secondary | ICD-10-CM

## 2021-09-13 DIAGNOSIS — R300 Dysuria: Secondary | ICD-10-CM

## 2021-09-13 DIAGNOSIS — R42 Dizziness and giddiness: Secondary | ICD-10-CM

## 2021-09-13 LAB — POCT URINALYSIS DIPSTICK OB
Glucose, UA: NEGATIVE
Ketones, UA: NEGATIVE
Nitrite, UA: POSITIVE
POC,PROTEIN,UA: NEGATIVE

## 2021-09-13 LAB — POCT HEMOGLOBIN: Hemoglobin: 13.1 g/dL (ref 11–14.6)

## 2021-09-13 LAB — POCT URINE PREGNANCY: Preg Test, Ur: NEGATIVE

## 2021-09-13 MED ORDER — NITROFURANTOIN MONOHYD MACRO 100 MG PO CAPS: 100.0000 mg | ORAL_CAPSULE | Freq: Two times a day (BID) | ORAL | 0 refills | Status: DC

## 2021-09-13 NOTE — Addendum Note (Signed)
Addended by: Dorita Sciara, Allia Wiltsey A on: 09/13/2021 02:53 PM   Modules accepted: Orders

## 2021-09-13 NOTE — Progress Notes (Signed)
GYN VISIT Patient name: Colleen Arnold MRN [id number]  Date of birth: [birth year] Chief Complaint:   Menstrual Problem and Vaginal Pain  History of Present Illness:   Colleen Arnold is a 35 y.o. G73P2002 Hispanic female being seen today for report of dysuria, feeling like needs to pee, but only a little comes out. Pelvic pain, different than before, this is generalized x 1wk w/ vaginal d/c and itching. Dizziness w/ position changes for about a month. Has been going long times w/o eating trying to lose weight. Constipation, last bm on Thursday. Stopped COCs after our visit 37mth ago, no period yet, is trying for pregnancy and taking pnv.     No LMP recorded (lmp unknown). The current method of family planning is none.  Last pap 06/18/21. Results were: NILM w/ HRHPV negative  Depression screen [facility] 2/9 08/04/2020 07/27/2020 04/27/2020  Decreased Interest 0 0 0  Down, Depressed, Hopeless 0 0 0  PHQ - 2 Score 0 0 0  Altered sleeping - 1 1  Tired, decreased energy - 1 1  Change in appetite - 0 0  Feeling bad or failure about yourself  - 0 0  Trouble concentrating - 0 0  Moving slowly or fidgety/restless - 0 0  Suicidal thoughts - 0 0  PHQ-9 Score - 2 2     GAD 7 : Generalized Anxiety Score 07/27/2020 04/27/2020  Nervous, Anxious, on Edge 0 0  Control/stop worrying 1 0  Worry too much - different things 1 0  Trouble relaxing 0 0  Restless 0 0  Easily annoyed or irritable 0 0  Afraid - awful might happen 1 0  Total GAD 7 Score 3 0     Review of Systems:   Pertinent items are noted in HPI Denies fever/chills, dizziness, headaches, visual disturbances, fatigue, shortness of breath, chest pain, abdominal pain, vomiting, abnormal vaginal discharge/itching/odor/irritation, problems with periods, bowel movements, urination, or intercourse unless otherwise stated above.  Pertinent History Reviewed:  Reviewed past medical,surgical, social, obstetrical and family history.  Reviewed problem list,  medications and allergies. Physical Assessment:   Vitals:   09/13/21 1144  BP: 136/88  Pulse: 74  Weight: 266 lb (120.7 kg)  Height: 5\' 3"  (1.6 m)  Body mass index is 47.12 kg/m.       Physical Examination:   General appearance: alert, well appearing, and in no distress  Mental status: alert, oriented to person, place, and time  Skin: warm & dry   Cardiovascular: normal heart rate noted  Respiratory: normal respiratory effort, no distress  Abdomen: soft, non-tender   Pelvic: VULVA: normal appearing vulva with no masses, tenderness or lesions, VAGINA: normal appearing vagina with normal color and discharge, no lesions, CERVIX: normal appearing cervix without discharge or lesions  Extremities: no edema   Chaperone: Marcelino Scot    Results for orders placed or performed in visit on 09/13/21 (from the past 24 hour(s))  POCT urine pregnancy   Collection Time: 09/13/21 11:49 AM  Result Value Ref Range   Preg Test, Ur Negative Negative  POCT hemoglobin   Collection Time: 09/13/21 12:16 PM  Result Value Ref Range   Hemoglobin 13.1 11 - 14.6 g/dL    Assessment & Plan:  1) Dysuria/hesitnancy> +nitrates, rx macrobid, send cx  2) Generalized pelvic pain/vag d/c and itching> CV swab  3) Trying for pregnancy> no period yet since stopping COCs 60mth ago, UPT neg today, continue taking HPT weekly until period starts or +HPT. If 39mths w/o period  and neg HPT, let us know  4) Dizziness w/ position changes> fingerstick hgb 13.1. Increase po water intake, change positions slowly, do not go long periods of time w/o eating  5) Constipation> gave printed prevention/relief measures   Meds:  Meds ordered this encounter  Medications   nitrofurantoin, macrocrystal-monohydrate, (MACROBID) 100 MG capsule    Sig: Take 1 capsule (100 mg total) by mouth 2 (two) times daily. X 7 days    Dispense:  14 capsule    Refill:  0    Order Specific Question:   Supervising Provider    Answer:   Tania Ade H  [[id number]]    Orders Placed This Encounter  Procedures   Urine Culture   POCT urine pregnancy   POCT hemoglobin    Return for prn.  [place], [facility] 09/13/2021 12:22 PM

## 2021-09-13 NOTE — Patient Instructions (Signed)

## 2021-09-14 ENCOUNTER — Telehealth: Payer: Self-pay | Admitting: Women's Health

## 2021-09-14 LAB — CERVICOVAGINAL ANCILLARY ONLY
Bacterial Vaginitis (gardnerella): POSITIVE — AB
Candida Glabrata: NEGATIVE
Candida Vaginitis: NEGATIVE
Chlamydia: NEGATIVE
Comment: NEGATIVE
Comment: NEGATIVE
Comment: NEGATIVE
Comment: NEGATIVE
Comment: NEGATIVE
Comment: NORMAL
Neisseria Gonorrhea: NEGATIVE
Trichomonas: NEGATIVE

## 2021-09-14 MED ORDER — METRONIDAZOLE 500 MG PO TABS: 500.0000 mg | ORAL_TABLET | Freq: Two times a day (BID) | ORAL | 0 refills | Status: DC

## 2021-09-14 NOTE — Addendum Note (Signed)
Addended by: Wells Guiles R on: 09/14/2021 03:01 PM   Modules accepted: Orders

## 2021-09-14 NOTE — Telephone Encounter (Signed)
Pt requesting Daisy call her to explain her lab results See result note on her recent labs

## 2021-09-16 LAB — URINE CULTURE

## 2021-10-31 ENCOUNTER — Emergency Department (HOSPITAL_COMMUNITY)
Admission: EM | Admit: 2021-10-31 | Discharge: 2021-10-31 | Disposition: A | Discharge status: Home / Self Care | Payer: Self-pay | Attending: Student | Admitting: Student

## 2021-10-31 ENCOUNTER — Encounter (HOSPITAL_COMMUNITY): Payer: Self-pay | Admitting: Emergency Medicine

## 2021-10-31 ENCOUNTER — Emergency Department (HOSPITAL_COMMUNITY): Payer: Self-pay

## 2021-10-31 DIAGNOSIS — R102 Pelvic and perineal pain: Secondary | ICD-10-CM | POA: Insufficient documentation

## 2021-10-31 DIAGNOSIS — R52 Pain, unspecified: Secondary | ICD-10-CM

## 2021-10-31 DIAGNOSIS — Z3A00 Weeks of gestation of pregnancy not specified: Secondary | ICD-10-CM | POA: Insufficient documentation

## 2021-10-31 DIAGNOSIS — Z3490 Encounter for supervision of normal pregnancy, unspecified, unspecified trimester: Secondary | ICD-10-CM

## 2021-10-31 DIAGNOSIS — O26899 Other specified pregnancy related conditions, unspecified trimester: Secondary | ICD-10-CM | POA: Insufficient documentation

## 2021-10-31 LAB — URINALYSIS, ROUTINE W REFLEX MICROSCOPIC
Bilirubin Urine: NEGATIVE
Glucose, UA: NEGATIVE mg/dL
Ketones, ur: NEGATIVE mg/dL
Leukocytes,Ua: NEGATIVE
Nitrite: NEGATIVE
Protein, ur: NEGATIVE mg/dL
Specific Gravity, Urine: 1.015 (ref 1.005–1.030)
pH: 6 (ref 5.0–8.0)

## 2021-10-31 LAB — COMPREHENSIVE METABOLIC PANEL
ALT: 150 U/L — ABNORMAL HIGH (ref 0–44)
AST: 100 U/L — ABNORMAL HIGH (ref 15–41)
Albumin: 3.7 g/dL (ref 3.5–5.0)
Alkaline Phosphatase: 52 U/L (ref 38–126)
Anion gap: 5 (ref 5–15)
BUN: 11 mg/dL (ref 6–20)
CO2: 24 mmol/L (ref 22–32)
Calcium: 8.8 mg/dL — ABNORMAL LOW (ref 8.9–10.3)
Chloride: 105 mmol/L (ref 98–111)
Creatinine, Ser: 0.48 mg/dL (ref 0.44–1.00)
GFR, Estimated: >60 mL/min (ref >60)
Glucose, Bld: 108 mg/dL — ABNORMAL HIGH (ref 70–99)
Potassium: 4.2 mmol/L (ref 3.5–5.1)
Sodium: 134 mmol/L — ABNORMAL LOW (ref 135–145)
Total Bilirubin: 0.4 mg/dL (ref 0.3–1.2)
Total Protein: 7.5 g/dL (ref 6.5–8.1)

## 2021-10-31 LAB — CBC WITH DIFFERENTIAL/PLATELET
Abs Immature Granulocytes: 0.03 10*3/uL (ref 0.00–0.07)
Basophils Absolute: 0 10*3/uL (ref 0.0–0.1)
Basophils Relative: 1 %
Eosinophils Absolute: 0.1 10*3/uL (ref 0.0–0.5)
Eosinophils Relative: 1 %
HCT: 41.4 % (ref 36.0–46.0)
Hemoglobin: 14.2 g/dL (ref 12.0–15.0)
Immature Granulocytes: 0 %
Lymphocytes Relative: 38 %
Lymphs Abs: 2.6 10*3/uL (ref 0.7–4.0)
MCH: 32.6 pg (ref 26.0–34.0)
MCHC: 34.3 g/dL (ref 30.0–36.0)
MCV: 95 fL (ref 80.0–100.0)
Monocytes Absolute: 0.6 10*3/uL (ref 0.1–1.0)
Monocytes Relative: 9 %
Neutro Abs: 3.5 10*3/uL (ref 1.7–7.7)
Neutrophils Relative %: 51 %
Platelets: 227 10*3/uL (ref 150–400)
RBC: 4.36 MIL/uL (ref 3.87–5.11)
RDW: 12.4 % (ref 11.5–15.5)
WBC: 6.7 10*3/uL (ref 4.0–10.5)
nRBC: 0 % (ref 0.0–0.2)

## 2021-10-31 LAB — LIPASE, BLOOD: Lipase: 33 U/L (ref 11–51)

## 2021-10-31 LAB — HCG, QUANTITATIVE, PREGNANCY: hCG, Beta Chain, Quant, S: 185 m[IU]/mL — ABNORMAL HIGH (ref <5)

## 2021-10-31 LAB — URINALYSIS, MICROSCOPIC (REFLEX)

## 2021-10-31 LAB — PREGNANCY, URINE: Preg Test, Ur: POSITIVE — AB

## 2021-10-31 MED ORDER — SODIUM CHLORIDE 0.9 % IV BOLUS
1000.0000 mL | Freq: Once | INTRAVENOUS | Status: AC
  Administered 2021-10-31: 1000 mL via INTRAVENOUS

## 2021-10-31 MED ORDER — MORPHINE SULFATE (PF) 4 MG/ML IV SOLN
4.0000 mg | Freq: Once | INTRAVENOUS | Status: AC
  Administered 2021-10-31: 4 mg via INTRAVENOUS
  Filled 2021-10-31: qty 1

## 2021-10-31 MED ORDER — ONDANSETRON HCL 4 MG/2ML IJ SOLN
4.0000 mg | Freq: Once | INTRAMUSCULAR | Status: AC
  Administered 2021-10-31: 4 mg via INTRAVENOUS
  Filled 2021-10-31: qty 2

## 2021-10-31 NOTE — ED Notes (Signed)
Pt d/c home with spouse per MD order. Discharge summary reviewed, verbalize understanding. Ambulatory off unit. No s/s of acute distress noted. Pt spouse is discharge ride home.

## 2021-10-31 NOTE — Discharge Instructions (Signed)
Your work-up today in the emergency room was reassuring against any serious cause of abdominal pain.  Ultrasound was done which you are pregnant which did not show any concern for ectopic pregnancy.  Your pregnancy count was low but it is normal for not to show up on ultrasound.  I did discuss this with your GYN office and they recommend following up Tuesday or Wednesday to have lab draw which they will reach out to you and schedule.  If you do not hear from them on Tuesday please call them to get this scheduled.

## 2021-10-31 NOTE — ED Triage Notes (Signed)
Pt to the ED with lower left abdominal that radiates to her lower back.  The pain began a month ago.

## 2021-10-31 NOTE — ED Provider Notes (Signed)
[facility] EMERGENCY DEPARTMENT Provider Note   CSN: [id number] Arrival date & time: 10/31/21  1035     History  Chief Complaint  Patient presents with   Abdominal Pain    Colleen Arnold is a 36 y.o. female.  36 year old female presents today for evaluation of 1 month duration of bilateral lower abdominal pain that has significantly worsened over the past few days associated with worsening nausea, lightheadedness, flank pain.  She is without vomiting.  Patient reports she has been trying to get pregnant in the past couple months and her last menstrual period was early December.  She denies vaginal discharge, vaginal bleeding, dysuria, fever, or chills.  Tolerating p.o. intake without difficulty.  The history is provided by the patient and the spouse. No language interpreter was used.      Home Medications Prior to Admission medications   Medication Sig Start Date End Date Taking? Authorizing Provider  ibuprofen (ADVIL) 200 MG tablet Take 200 mg by mouth every 6 (six) hours as needed.    [provider]  metroNIDAZOLE (FLAGYL) 500 MG tablet Take 1 tablet (500 mg total) by mouth 2 (two) times daily. 09/14/21   Roma Schanz, CNM  nitrofurantoin, macrocrystal-monohydrate, (MACROBID) 100 MG capsule Take 1 capsule (100 mg total) by mouth 2 (two) times daily. X 7 days 09/13/21   Roma Schanz, CNM  Pantoprazole Sodium (PROTONIX PO) Take by mouth as needed.    [provider]  prenatal vitamin w/FE, FA (PRENATAL 1 + 1) 27-1 MG TABS tablet Take 1 tablet by mouth daily at 12 noon. Patient not taking: Reported on 09/13/2021 08/09/21   Roma Schanz, CNM      Allergies    Rocephin [ceftriaxone sodium in dextrose]    Review of Systems   Review of Systems  Constitutional:  Negative for chills and fever.  Respiratory:  Negative for shortness of breath.   Gastrointestinal:  Positive for abdominal pain and nausea. Negative for vomiting.  Genitourinary:   Positive for flank pain. Negative for dysuria, vaginal bleeding and vaginal discharge.  Neurological:  Negative for weakness and light-headedness.  All other systems reviewed and are negative.  Physical Exam Updated Vital Signs BP 126/73    Pulse 71    Temp 98.4 F (36.9 C) (Oral)    Resp 16    Ht $R'5\' 3"'rT$  (1.6 m)    Wt 120.7 kg    LMP 09/28/2021 (Approximate)    SpO2 95%    BMI 47.14 kg/m  Physical Exam Vitals and nursing note reviewed.  Constitutional:      General: She is not in acute distress.    Appearance: Normal appearance. She is not ill-appearing.  HENT:     Head: Normocephalic and atraumatic.     Nose: Nose normal.  Eyes:     General: No scleral icterus.    Extraocular Movements: Extraocular movements intact.     Conjunctiva/sclera: Conjunctivae normal.  Cardiovascular:     Rate and Rhythm: Normal rate and regular rhythm.     Pulses: Normal pulses.     Heart sounds: Normal heart sounds.  Pulmonary:     Effort: Pulmonary effort is normal. No respiratory distress.     Breath sounds: Normal breath sounds. No wheezing or rales.  Abdominal:     General: There is no distension.     Palpations: Abdomen is soft.     Tenderness: There is abdominal tenderness. There is right CVA tenderness. There is no left CVA tenderness  or guarding.  Musculoskeletal:        General: Normal range of motion.     Cervical back: Normal range of motion.  Skin:    General: Skin is warm and dry.  Neurological:     General: No focal deficit present.     Mental Status: She is alert. Mental status is at baseline.    ED Results / Procedures / Treatments   Labs (all labs ordered are listed, but only abnormal results are displayed) Labs Reviewed  CBC WITH DIFFERENTIAL/PLATELET  COMPREHENSIVE METABOLIC PANEL  LIPASE, BLOOD  URINALYSIS, ROUTINE W REFLEX MICROSCOPIC  PREGNANCY, URINE    EKG None  Radiology No results found.  Procedures Procedures    Medications Ordered in ED Medications   morphine 4 MG/ML injection 4 mg (has no administration in time range)  ondansetron (ZOFRAN) injection 4 mg (has no administration in time range)  sodium chloride 0.9 % bolus 1,000 mL (has no administration in time range)    ED Course/ Medical Decision Making/ A&P Clinical Course as of 10/31/21 1610  Sun Oct 31, 2021  1414 Patient's initial blood work significant for mild transaminitis, UA without concern for UTI, urine pregnancy test is positive, CBC without leukocytosis, or anemia.  Improvement of abdominal pain from level 8 to level 5.  We will cancel CT abdomen and pelvis and perform OB ultrasound to rule out ectopic. [AA]    Clinical Course User Index [AA] Evlyn Courier, PA-C                           Medical Decision Making  Medical Decision Making / ED Course   This patient presents to the ED for concern of pelvic pain of 1 month duration, this involves an extensive number of treatment options, and is a complaint that carries with it a high risk of complications and morbidity.  The differential diagnosis includes ectopic pregnancy, ovarian torsion, appendicitis  MDM: 36 year old female presents today for evaluation of pelvic pain of 1 month duration.  Patient reports is bilateral lower abdomen however when asked to point to one spot that hurts the most she points to right lower quadrant.  She reports associated nausea without vomiting.  Per chart review appears she has had GYN visits in fall 2022 for pelvic pain as well.  Patient denies fever or chills.  Work-up today is without leukocytosis, anemia.  CMP does show mild transaminitis without hyperbilirubinemia or alk phos elevation.  Patient's UA does not show signs of UTI however she does have a positive urine pregnancy.  Patient has been attempting to get pregnant now for couple months.  Patient follows with GYN for this reason.  Ultrasound was done in the emergency room and does not show intrauterine pregnancy.  Patient's quant is 185  which is also too low to be seen on ultrasound.  Discussed with GYN who will schedule her a follow-up lab visit for Tuesday or Wednesday to have repeat quant.  Pain significantly improved after pain medicine.  Abdomen nontender to palpation on repeat evaluation.  Return precautions discussed with patient and husband.  Discussed importance of follow-up with PCP for further work-up of pain persist.  They voiced understanding and are in agreement with plan.  Lab Tests: -I ordered, reviewed, and interpreted labs.   The pertinent results include:   Labs Reviewed  COMPREHENSIVE METABOLIC PANEL - Abnormal; Notable for the following components:      Result Value  Sodium 134 (*)    Glucose, Bld 108 (*)    Calcium 8.8 (*)    AST 100 (*)    ALT 150 (*)    All other components within normal limits  URINALYSIS, ROUTINE W REFLEX MICROSCOPIC - Abnormal; Notable for the following components:   Hgb urine dipstick MODERATE (*)    All other components within normal limits  PREGNANCY, URINE - Abnormal; Notable for the following components:   Preg Test, Ur POSITIVE (*)    All other components within normal limits  URINALYSIS, MICROSCOPIC (REFLEX) - Abnormal; Notable for the following components:   Bacteria, UA RARE (*)    All other components within normal limits  HCG, QUANTITATIVE, PREGNANCY - Abnormal; Notable for the following components:   hCG, Beta Chain, Quant, S 185 (*)    All other components within normal limits  CBC WITH DIFFERENTIAL/PLATELET  LIPASE, BLOOD      EKG  EKG Interpretation  Date/Time:    Ventricular Rate:    PR Interval:    QRS Duration:   QT Interval:    QTC Calculation:   R Axis:     Text Interpretation:           Imaging Studies ordered: I ordered imaging studies including OB ultrasound I independently visualized and interpreted imaging. I agree with the radiologist interpretation   Medicines ordered and prescription drug management: Meds ordered this  encounter  Medications   morphine 4 MG/ML injection 4 mg   ondansetron (ZOFRAN) injection 4 mg   sodium chloride 0.9 % bolus 1,000 mL    -I have reviewed the patients home medicines and have made adjustments as needed  Consultations Obtained: I requested consultation with the GYN,  and discussed lab and imaging findings as well as pertinent plan - they recommend: Follow-up in clinic for repeat quant.  No acute concerns from their standpoint with the work-up so far.  Reevaluation: After the interventions noted above, I reevaluated the patient and found that they have :improved  Co morbidities that complicate the patient evaluation  Past Medical History:  Diagnosis Date   Bile duct stone    Cholecystitis, acute with cholelithiasis    Cyst, ovarian    Elevated LFTs    Gestational diabetes      Dispostion: Patient is appropriate for discharge.  Follow-up with GYN discussed.  Return precautions discussed.  Patient and husband voiced understanding and are in agreement with plan.   Final Clinical Impression(s) / ED Diagnoses Final diagnoses:  Pelvic pain in female  Pregnancy, unspecified gestational age    Rx / DC Orders ED Discharge Orders     None         Evlyn Courier, PA-C 10/31/21 1617    [place], Debe Coder, MD 10/31/21 1659

## 2021-11-01 ENCOUNTER — Encounter: Payer: Self-pay | Admitting: Women's Health

## 2021-11-01 ENCOUNTER — Ambulatory Visit (INDEPENDENT_AMBULATORY_CARE_PROVIDER_SITE_OTHER): Payer: Self-pay | Admitting: Women's Health

## 2021-11-01 ENCOUNTER — Other Ambulatory Visit: Payer: Self-pay

## 2021-11-01 VITALS — BP 119/78 | HR 67 | Ht 63.0 in | Wt 262.5 lb

## 2021-11-01 DIAGNOSIS — O3680X0 Pregnancy with inconclusive fetal viability, not applicable or unspecified: Secondary | ICD-10-CM

## 2021-11-01 DIAGNOSIS — Z3A01 Less than 8 weeks gestation of pregnancy: Secondary | ICD-10-CM

## 2021-11-01 NOTE — Progress Notes (Signed)
GYN VISIT Patient name: Colleen Arnold MRN [id number]  Date of birth: 1986/09/05 Chief Complaint:   Follow-up (ER follow up; + pregnancy test)  History of Present Illness:   Colleen Arnold is a 36 y.o. G42P2002 Hispanic female being seen today for f/u after ER visit yesterday for lower abdominal pain x 12mth, also nausea. Has been trying to get pregnant. Had +HPT, and yesterday BHCG was 185 (~2-3wks). LMP 12/9, which would make her [redacted]w[redacted]d.  Taking pnv.  Patient's last menstrual period was 09/24/2021. The current method of family planning is none.  Last pap 06/18/21. Results were: NILM w/ HRHPV negative  Depression screen [facility] 2/9 08/04/2020 07/27/2020 04/27/2020  Decreased Interest 0 0 0  Down, Depressed, Hopeless 0 0 0  PHQ - 2 Score 0 0 0  Altered sleeping - 1 1  Tired, decreased energy - 1 1  Change in appetite - 0 0  Feeling bad or failure about yourself  - 0 0  Trouble concentrating - 0 0  Moving slowly or fidgety/restless - 0 0  Suicidal thoughts - 0 0  PHQ-9 Score - 2 2     GAD 7 : Generalized Anxiety Score 07/27/2020 04/27/2020  Nervous, Anxious, on Edge 0 0  Control/stop worrying 1 0  Worry too much - different things 1 0  Trouble relaxing 0 0  Restless 0 0  Easily annoyed or irritable 0 0  Afraid - awful might happen 1 0  Total GAD 7 Score 3 0     Review of Systems:   Pertinent items are noted in HPI Denies fever/chills, dizziness, headaches, visual disturbances, fatigue, shortness of breath, chest pain, abdominal pain, vomiting, abnormal vaginal discharge/itching/odor/irritation, problems with periods, bowel movements, urination, or intercourse unless otherwise stated above.  Pertinent History Reviewed:  Reviewed past medical,surgical, social, obstetrical and family history.  Reviewed problem list, medications and allergies. Physical Assessment:   Vitals:   11/01/21 1014  BP: 119/78  Pulse: 67  Weight: 262 lb 8 oz (119.1 kg)  Height: 5\' 3"  (1.6 m)  Body mass  index is 46.5 kg/m.       Physical Examination:   General appearance: alert, well appearing, and in no distress  Mental status: alert, oriented to person, place, and time  Skin: warm & dry   Cardiovascular: normal heart rate noted  Respiratory: normal respiratory effort, no distress  Abdomen: soft, non-tender   Pelvic: examination not indicated  Extremities: no edema   Chaperone: N/A    Results for orders placed or performed during the hospital encounter of 10/31/21 (from the past 24 hour(s))  CBC with Differential   Collection Time: 10/31/21 12:22 PM  Result Value Ref Range   WBC 6.7 4.0 - 10.5 K/uL   RBC 4.36 3.87 - 5.11 MIL/uL   Hemoglobin 14.2 12.0 - 15.0 g/dL   HCT 41.4 36.0 - 46.0 %   MCV 95.0 80.0 - 100.0 fL   MCH 32.6 26.0 - 34.0 pg   MCHC 34.3 30.0 - 36.0 g/dL   RDW 12.4 11.5 - 15.5 %   Platelets 227 150 - 400 K/uL   nRBC 0.0 0.0 - 0.2 %   Neutrophils Relative % 51 %   Neutro Abs 3.5 1.7 - 7.7 K/uL   Lymphocytes Relative 38 %   Lymphs Abs 2.6 0.7 - 4.0 K/uL   Monocytes Relative 9 %   Monocytes Absolute 0.6 0.1 - 1.0 K/uL   Eosinophils Relative 1 %   Eosinophils Absolute 0.1 0.0 -  0.5 K/uL   Basophils Relative 1 %   Basophils Absolute 0.0 0.0 - 0.1 K/uL   Immature Granulocytes 0 %   Abs Immature Granulocytes 0.03 0.00 - 0.07 K/uL  Comprehensive metabolic panel   Collection Time: 10/31/21 12:22 PM  Result Value Ref Range   Sodium 134 (L) 135 - 145 mmol/L   Potassium 4.2 3.5 - 5.1 mmol/L   Chloride 105 98 - 111 mmol/L   CO2 24 22 - 32 mmol/L   Glucose, Bld 108 (H) 70 - 99 mg/dL   BUN 11 6 - 20 mg/dL   Creatinine, Ser 0.48 0.44 - 1.00 mg/dL   Calcium 8.8 (L) 8.9 - 10.3 mg/dL   Total Protein 7.5 6.5 - 8.1 g/dL   Albumin 3.7 3.5 - 5.0 g/dL   AST 100 (H) 15 - 41 U/L   ALT 150 (H) 0 - 44 U/L   Alkaline Phosphatase 52 38 - 126 U/L   Total Bilirubin 0.4 0.3 - 1.2 mg/dL   GFR, Estimated >60 >60 mL/min   Anion gap 5 5 - 15  Lipase, blood   Collection Time:  10/31/21 12:22 PM  Result Value Ref Range   Lipase 33 11 - 51 U/L  hCG, quantitative, pregnancy   Collection Time: 10/31/21 12:22 PM  Result Value Ref Range   hCG, Beta Chain, Quant, S 185 (H) <5 mIU/mL  Urinalysis, Routine w reflex microscopic   Collection Time: 10/31/21 12:31 PM  Result Value Ref Range   Color, Urine YELLOW YELLOW   APPearance CLEAR CLEAR   Specific Gravity, Urine 1.015 1.005 - 1.030   pH 6.0 5.0 - 8.0   Glucose, UA NEGATIVE NEGATIVE mg/dL   Hgb urine dipstick MODERATE (A) NEGATIVE   Bilirubin Urine NEGATIVE NEGATIVE   Ketones, ur NEGATIVE NEGATIVE mg/dL   Protein, ur NEGATIVE NEGATIVE mg/dL   Nitrite NEGATIVE NEGATIVE   Leukocytes,Ua NEGATIVE NEGATIVE  Pregnancy, urine   Collection Time: 10/31/21 12:31 PM  Result Value Ref Range   Preg Test, Ur POSITIVE (A) NEGATIVE  Urinalysis, Microscopic (reflex)   Collection Time: 10/31/21 12:31 PM  Result Value Ref Range   RBC / HPF 0-5 0 - 5 RBC/hpf   WBC, UA 0-5 0 - 5 WBC/hpf   Bacteria, UA RARE (A) NONE SEEN   Squamous Epithelial / LPF 0-5 0 - 5   Budding Yeast PRESENT     Assessment & Plan:  1) Early pregnancy of unknown location> repeat bhcg tomorrow at 1330, will call her w/ results Wed as she does not use mychart. Continue pnv. Reviewed ectopic warning s/s, reasons to seek care.   Meds: No orders of the defined types were placed in this encounter.   Orders Placed This Encounter  Procedures   Beta hCG quant (ref lab)    Return for tomorrow at 1:30pm for labs.  [place], [facility] 11/01/2021 10:28 AM

## 2021-11-02 ENCOUNTER — Other Ambulatory Visit: Payer: Self-pay

## 2021-11-02 ENCOUNTER — Telehealth: Payer: Self-pay | Admitting: Women's Health

## 2021-11-02 NOTE — Telephone Encounter (Signed)
Patient came into the office stating that she for got to ask [name] regarding the [place] that was done at the hospital, did they find everything okay or was there something else wrong. Please contact pt

## 2021-11-03 ENCOUNTER — Telehealth: Payer: Self-pay | Admitting: Women's Health

## 2021-11-03 LAB — BETA HCG QUANT (REF LAB): hCG Quant: 342 m[IU]/mL

## 2021-11-03 NOTE — Telephone Encounter (Signed)
Patient called stating that she would like to know the results of her blood work she had done yesterday and she would also like to know regarding the message she left yesterday. Please call patient.

## 2021-11-03 NOTE — Telephone Encounter (Signed)
Pt aware quant is rising but not doubling. Need to repeat quant in 1 week. If she has heavy bleeding or severe pain, call us or go to [facility]. Daisy spoke with pt. [place]

## 2021-11-03 NOTE — Telephone Encounter (Signed)
Spoke with patient regarding the message she has left for Kim in [place]. I informed patient regarding the Korea at the hospital that it was to early for her to see anything. I also explained to her that her blood work did not show her numbers doubling so she would need to do a repeat quant to check her numbers again. Patient verbalized understanding.

## 2021-11-03 NOTE — Telephone Encounter (Signed)
Pt aware Korea didn't show anything due to being so early. Colleen Arnold spoke with pt. Colleen Arnold

## 2021-11-09 ENCOUNTER — Other Ambulatory Visit: Payer: Self-pay

## 2021-11-09 ENCOUNTER — Other Ambulatory Visit (HOSPITAL_COMMUNITY)
Admission: RE | Admit: 2021-11-09 | Discharge: 2021-11-09 | Disposition: A | Discharge status: Home / Self Care | Payer: Self-pay | Source: Ambulatory Visit | Attending: Women's Health | Admitting: Women's Health

## 2021-11-09 DIAGNOSIS — O3680X0 Pregnancy with inconclusive fetal viability, not applicable or unspecified: Secondary | ICD-10-CM | POA: Insufficient documentation

## 2021-11-10 ENCOUNTER — Telehealth: Payer: Self-pay | Admitting: Women's Health

## 2021-11-10 LAB — BETA HCG QUANT (REF LAB): hCG Quant: 3176 m[IU]/mL

## 2021-11-10 NOTE — Telephone Encounter (Signed)
Patient called stating that she went to the hospital yesterday and had her bloodwork done and she would like to know the results. Patient speaks spanish.

## 2021-12-06 ENCOUNTER — Other Ambulatory Visit: Payer: Self-pay | Admitting: Obstetrics & Gynecology

## 2021-12-06 DIAGNOSIS — O3680X0 Pregnancy with inconclusive fetal viability, not applicable or unspecified: Secondary | ICD-10-CM

## 2021-12-07 ENCOUNTER — Ambulatory Visit (INDEPENDENT_AMBULATORY_CARE_PROVIDER_SITE_OTHER): Payer: Self-pay

## 2021-12-07 ENCOUNTER — Other Ambulatory Visit: Payer: Self-pay

## 2021-12-07 DIAGNOSIS — Z3A10 10 weeks gestation of pregnancy: Secondary | ICD-10-CM

## 2021-12-07 DIAGNOSIS — O3680X0 Pregnancy with inconclusive fetal viability, not applicable or unspecified: Secondary | ICD-10-CM

## 2021-12-07 NOTE — Progress Notes (Addendum)
[place] 9+3 wks,single IUP with YS,FHR 173 bpm,subchorionic hemorrhage 3.2 x 2.1 x 1.5 cm ,normal right ovary,limited view of left ovary,CRL 26.83 mm

## 2021-12-13 DIAGNOSIS — O0990 Supervision of high risk pregnancy, unspecified, unspecified trimester: Secondary | ICD-10-CM | POA: Insufficient documentation

## 2021-12-13 DIAGNOSIS — Z3490 Encounter for supervision of normal pregnancy, unspecified, unspecified trimester: Secondary | ICD-10-CM | POA: Insufficient documentation

## 2021-12-24 ENCOUNTER — Other Ambulatory Visit: Payer: Self-pay | Admitting: Obstetrics & Gynecology

## 2021-12-24 DIAGNOSIS — Z3682 Encounter for antenatal screening for nuchal translucency: Secondary | ICD-10-CM

## 2021-12-27 ENCOUNTER — Other Ambulatory Visit (HOSPITAL_COMMUNITY)
Admission: RE | Admit: 2021-12-27 | Discharge: 2021-12-27 | Disposition: A | Discharge status: Home / Self Care | Payer: Self-pay | Source: Ambulatory Visit | Attending: Women's Health | Admitting: Women's Health

## 2021-12-27 ENCOUNTER — Ambulatory Visit: Payer: Self-pay | Admitting: *Deleted

## 2021-12-27 ENCOUNTER — Ambulatory Visit (INDEPENDENT_AMBULATORY_CARE_PROVIDER_SITE_OTHER): Payer: Self-pay | Admitting: Women's Health

## 2021-12-27 ENCOUNTER — Ambulatory Visit (INDEPENDENT_AMBULATORY_CARE_PROVIDER_SITE_OTHER): Payer: Self-pay

## 2021-12-27 ENCOUNTER — Encounter: Payer: Self-pay | Admitting: Women's Health

## 2021-12-27 ENCOUNTER — Other Ambulatory Visit: Payer: Self-pay

## 2021-12-27 VITALS — BP 107/68 | HR 89 | Wt 269.0 lb

## 2021-12-27 DIAGNOSIS — N898 Other specified noninflammatory disorders of vagina: Secondary | ICD-10-CM

## 2021-12-27 DIAGNOSIS — Z3480 Encounter for supervision of other normal pregnancy, unspecified trimester: Secondary | ICD-10-CM

## 2021-12-27 DIAGNOSIS — Z113 Encounter for screening for infections with a predominantly sexual mode of transmission: Secondary | ICD-10-CM

## 2021-12-27 DIAGNOSIS — Z3481 Encounter for supervision of other normal pregnancy, first trimester: Secondary | ICD-10-CM

## 2021-12-27 DIAGNOSIS — Z3682 Encounter for antenatal screening for nuchal translucency: Secondary | ICD-10-CM

## 2021-12-27 DIAGNOSIS — Z3A12 12 weeks gestation of pregnancy: Secondary | ICD-10-CM

## 2021-12-27 DIAGNOSIS — Z8759 Personal history of other complications of pregnancy, childbirth and the puerperium: Secondary | ICD-10-CM

## 2021-12-27 DIAGNOSIS — Z8632 Personal history of gestational diabetes: Secondary | ICD-10-CM

## 2021-12-27 LAB — POCT URINALYSIS DIPSTICK OB
Blood, UA: NEGATIVE
Glucose, UA: NEGATIVE
Ketones, UA: NEGATIVE
Leukocytes, UA: NEGATIVE
Nitrite, UA: NEGATIVE
POC,PROTEIN,UA: NEGATIVE

## 2021-12-27 MED ORDER — PANTOPRAZOLE SODIUM 20 MG PO TBEC: 20.0000 mg | DELAYED_RELEASE_TABLET | Freq: Every day | ORAL | 6 refills | Status: DC

## 2021-12-27 MED ORDER — ASPIRIN 81 MG PO TBEC: 162.0000 mg | DELAYED_RELEASE_TABLET | Freq: Every day | ORAL | 2 refills | Status: DC

## 2021-12-27 NOTE — Progress Notes (Signed)
? ? ?INITIAL OBSTETRICAL VISIT ?Patient name: Colleen Arnold MRN [id number]  Date of birth: May 31, 1986 ?Chief Complaint:   ?Initial Prenatal Visit (Diarrhea all weekend, vaginal itching) ? ?History of Present Illness:   ?Colleen Arnold is a 36 y.o. G13P2002 Hispanic female at 15w2dby [place] 9 weeks with an Estimated Date of Delivery: 07/09/22 being seen today for her initial obstetrical visit.   ?Patient's last menstrual period was 09/24/2021 (exact date). ?Her obstetrical history is significant for  term C/S x 2 .  H/O GDM and GHTN ?Today she reports  diarrhea over the weekend, none right now. Vaginal itching, no odor . Requests refill on protonix.  ?Last pap 06/18/21. Results were: NILM w/ HRHPV negative ? ?[facility] 12/27/2021 08/04/2020 07/27/2020 04/27/2020  ?Decreased Interest 0 0 0 0  ?Down, Depressed, Hopeless 0 0 0 0  ?PHQ - 2 Score 0 0 0 0  ?Altered sleeping 1 - 1 1  ?Tired, decreased energy 1 - 1 1  ?Change in appetite 0 - 0 0  ?Feeling bad or failure about yourself  0 - 0 0  ?Trouble concentrating 0 - 0 0  ?Moving slowly or fidgety/restless 0 - 0 0  ?Suicidal thoughts 0 - 0 0  ?PHQ-9 Score 2 - 2 2  ? ?  ?GAD 7 : Generalized Anxiety Score 12/27/2021 07/27/2020 04/27/2020  ?Nervous, Anxious, on Edge 0 0 0  ?Control/stop worrying 0 1 0  ?Worry too much - different things 0 1 0  ?Trouble relaxing 0 0 0  ?Restless 0 0 0  ?Easily annoyed or irritable 0 0 0  ?Afraid - awful might happen 0 1 0  ?Total GAD 7 Score 0 3 0  ? ? ? ?Review of Systems:   ?Pertinent items are noted in HPI ?Denies cramping/contractions, leakage of fluid, vaginal bleeding, abnormal vaginal discharge w/ itching/odor/irritation, headaches, visual changes, shortness of breath, chest pain, abdominal pain, severe nausea/vomiting, or problems with urination or bowel movements unless otherwise stated above.  ?Pertinent History Reviewed:  ?Reviewed past medical,surgical, social, obstetrical and family history.  ?Reviewed problem list,  medications and allergies. ?OB History  ?Gravida Para Term Preterm AB Living  ?'3 2 2     2  '$ ?SAB IAB Ectopic Multiple Live Births  ?      0 2  ?  ?# Outcome Date GA Lbr Len/2nd Weight Sex Delivery Anes PTL Lv  ?3 Current           ?2 Term 10/19/20 347w1d9 lb 2.7 oz (4.159 kg) F CS-LTranv Spinal  LIV  ?   Complications: Gestational diabetes  ?1 Term 07/19/08   8 lb 6 oz (3.799 kg) F CS-LTranv  N LIV  ?   Complications: Gestational diabetes  ?  ?Obstetric Comments  ?#1- "big baby"- scheduled  ? ?Physical Assessment:  ? ?Vitals:  ? 12/27/21 1421  ?BP: 107/68  ?Pulse: 89  ?Weight: 269 lb (122 kg)  ?Body mass index is 47.65 kg/m?. ? ?     Physical Examination: ? General appearance - well appearing, and in no distress ? Mental status - alert, oriented to person, place, and time ? Psych:  She has a normal mood and affect ? Skin - warm and dry, normal color, no suspicious lesions noted ? Chest - effort normal, all lung fields clear to auscultation bilaterally ? Heart - normal rate and regular rhythm ? Abdomen - soft, nontender ? Extremities:  No swelling or varicosities noted ? Pelvic -  VULVA: normal appearing vulva with no masses, tenderness or lesions  VAGINA: normal appearing vagina with normal color and discharge, no lesions, CV swab collected  CERVIX: normal appearing cervix without discharge or lesions ? Thin prep pap is not done ? ?Chaperone: Glenard Haring [place]   ? ?TODAY'S NT [place] 12+2 wks,measurements c/w dates,CRL 66.59 mm,NB present,NT 2.5 mm,normal right ovary,left ovary not visualized,FHR 159 bpm ? ?Results for orders placed or performed in visit on 12/27/21 (from the past 24 hour(s))  ?POC Urinalysis Dipstick OB  ? Collection Time: 12/27/21  3:47 PM  ?Result Value Ref Range  ? Color, UA    ? Clarity, UA    ? Glucose, UA Negative Negative  ? Bilirubin, UA    ? Ketones, UA neg   ? Spec Grav, UA    ? Blood, UA neg   ? pH, UA    ? POC,PROTEIN,UA Negative Negative, Trace, Small (1+), Moderate (2+), Large (3+), 4+  ?  Urobilinogen, UA    ? Nitrite, UA neg   ? Leukocytes, UA Negative Negative  ? Appearance    ? Odor    ?  ?Assessment & Plan:  ?1) Low-Risk Pregnancy G3P2002 at 57w2dwith an Estimated Date of Delivery: 07/09/22  ? ?2) Initial OB visit ? ?3) Recent diarrhea> none now, if returns can take imodium, GI probiotic, stay hydrated ? ?4) Prev c/s x 2> for RCS ? ?5) H/O GDM> A1C today ? ?6) H/O GHTN> baseline labs today, start ASA '162mg'$  ? ?Meds:  ?Meds ordered this encounter  ?Medications  ? pantoprazole (PROTONIX) 20 MG tablet  ?  Sig: Take 1 tablet (20 mg total) by mouth daily.  ?  Dispense:  30 tablet  ?  Refill:  6  ?  Order Specific Question:   Supervising Provider  ?  Answer:   ETania AdeH [[id number]]  ? aspirin 81 MG EC tablet  ?  Sig: Take 2 tablets (162 mg total) by mouth daily. Swallow whole.  ?  Dispense:  180 tablet  ?  Refill:  2  ?  Order Specific Question:   Supervising Provider  ?  Answer:   ETania AdeH [[id number]]  ? ? ?Initial labs obtained ?Continue prenatal vitamins ?Reviewed n/v relief measures and warning s/s to report ?Reviewed recommended weight gain based on pre-gravid BMI ?Encouraged well-balanced diet ?Genetic & carrier screening discussed: requests Panorama and NT/IT, declines Horizon  ?Ultrasound discussed; fetal survey: requested ?CCNC completed> form faxed if has or is planning to apply for medicaid ?[facility] and other [facility] Providers was explained to patient; also emphasized that fellows, residents, and students are part of our team. ? ?Follow-up: Return in about 4 weeks (around 01/24/2022) for [place] 2[place] IT, CNM, in person.  ? ?Orders Placed This Encounter  ?Procedures  ? Urine Culture  ? Integrated 1  ? Hemoglobin A1c  ? Genetic Screening  ? CBC/D/Plt+RPR+Rh+ABO+RubIgG...  ? Comprehensive metabolic panel  ? Protein / creatinine ratio, urine  ? POC Urinalysis Dipstick OB  ? ? ?[place] WHNP-BC ?12/27/2021 ?4:09  PM  ?

## 2021-12-27 NOTE — Progress Notes (Signed)
[place] 12+2 wks,measurements c/w dates,CRL 66.59 mm,NB present,NT 2.5 mm,normal right ovary,left ovary not visualized,FHR 159 bpm ?

## 2021-12-27 NOTE — Patient Instructions (Signed)
Colleen Arnold, thank you for choosing our office today! We appreciate the opportunity to meet your healthcare needs. You may receive a short survey by mail, e-mail, or through [employer]. If you are happy with your care we would appreciate if you could take just a few minutes to complete the survey questions. We read all of your comments and take your feedback very seriously. Thank you again for choosing our office.  [facility] [employer] [facility] at [place] at [facility] ([place], [place] [id number]) Entrance C, located off of [address] parking   Nausea & Vomiting Have saltine crackers or pretzels by your bed and eat a few bites before you raise your head out of bed in the morning Eat small frequent meals throughout the day instead of large meals Drink plenty of fluids throughout the day to stay hydrated, just don't drink a lot of fluids with your meals.  This can make your stomach fill up faster making you feel sick Do not brush your teeth right after you eat Products with real ginger are good for nausea, like ginger ale and ginger hard candy Make sure it says made with real ginger! Sucking on sour candy like lemon heads is also good for nausea If your prenatal vitamins make you nauseated, take them at night so you will sleep through the nausea Sea Bands If you feel like you need medicine for the nausea & vomiting please let us know If you are unable to keep any fluids or food down please let us know   Constipation Drink plenty of fluid, preferably water, throughout the day Eat foods high in fiber such as fruits, vegetables, and grains Exercise, such as walking, is a good way to keep your bowels regular Drink warm fluids, especially warm prune juice, or decaf coffee Eat a 1/2 cup of real oatmeal (not instant), 1/2 cup applesauce, and 1/2-1 cup warm prune juice every day If needed, you may take Colace (docusate sodium) stool softener  once or twice a day to help keep the stool soft.  If you still are having problems with constipation, you may take Miralax once daily as needed to help keep your bowels regular.   Home Blood Pressure Monitoring for Patients   Your provider has recommended that you check your blood pressure (BP) at least once a week at home. If you do not have a blood pressure cuff at home, one will be provided for you. Contact your provider if you have not received your monitor within 1 week.   Helpful Tips for Accurate Home Blood Pressure Checks  Don't smoke, exercise, or drink caffeine 30 minutes before checking your BP Use the restroom before checking your BP (a full bladder can raise your pressure) Relax in a comfortable upright chair Feet on the ground Left arm resting comfortably on a flat surface at the level of your heart Legs uncrossed Back supported Sit quietly and don't talk Place the cuff on your bare arm Adjust snuggly, so that only two fingertips can fit between your skin and the top of the cuff Check 2 readings separated by at least one minute Keep a log of your BP readings For a visual, please reference this diagram: http://ccnc.care/bpdiagram  Provider Name: [facility]     Phone: [phone]  Zone 1: ALL CLEAR  Continue to monitor your symptoms:  BP reading is less than 140 (top number) or less than 90 (bottom  number)  No right upper stomach pain No headaches or seeing spots No feeling nauseated or throwing up No swelling in face and hands  Zone 2: CAUTION Call your doctor's office for any of the following:  BP reading is greater than 140 (top number) or greater than 90 (bottom number)  Stomach pain under your ribs in the middle or right side Headaches or seeing spots Feeling nauseated or throwing up Swelling in face and hands  Zone 3: EMERGENCY  Seek immediate medical care if you have any of the following:  BP reading is greater than160 (top number) or greater than  110 (bottom number) Severe headaches not improving with Tylenol Serious difficulty catching your breath Any worsening symptoms from Zone 2    First Trimester of Pregnancy The first trimester of pregnancy is from week 1 until the end of week 12 (months 1 through 3). A week after a sperm fertilizes an egg, the egg will implant on the wall of the uterus. This embryo will begin to develop into a baby. Genes from you and your partner are forming the baby. The female genes determine whether the baby is a boy or a girl. At 6-8 weeks, the eyes and face are formed, and the heartbeat can be seen on ultrasound. At the end of 12 weeks, all the baby's organs are formed.  Now that you are pregnant, you will want to do everything you can to have a healthy baby. Two of the most important things are to get good prenatal care and to follow your health care provider's instructions. Prenatal care is all the medical care you receive before the baby's birth. This care will help prevent, find, and treat any problems during the pregnancy and childbirth. BODY CHANGES Your body goes through many changes during pregnancy. The changes vary from woman to woman.  You may gain or lose a couple of pounds at first. You may feel sick to your stomach (nauseous) and throw up (vomit). If the vomiting is uncontrollable, call your health care provider. You may tire easily. You may develop headaches that can be relieved by medicines approved by your health care provider. You may urinate more often. Painful urination may mean you have a bladder infection. You may develop heartburn as a result of your pregnancy. You may develop constipation because certain hormones are causing the muscles that push waste through your intestines to slow down. You may develop hemorrhoids or swollen, bulging veins (varicose veins). Your breasts may begin to grow larger and become tender. Your nipples may stick out more, and the tissue that surrounds them  (areola) may become darker. Your gums may bleed and may be sensitive to brushing and flossing. Dark spots or blotches (chloasma, mask of pregnancy) may develop on your face. This will likely fade after the baby is born. Your menstrual periods will stop. You may have a loss of appetite. You may develop cravings for certain kinds of food. You may have changes in your emotions from day to day, such as being excited to be pregnant or being concerned that something may go wrong with the pregnancy and baby. You may have more vivid and strange dreams. You may have changes in your hair. These can include thickening of your hair, rapid growth, and changes in texture. Some women also have hair loss during or after pregnancy, or hair that feels dry or thin. Your hair will most likely return to normal after your baby is born. WHAT TO EXPECT AT YOUR PRENATAL   VISITS During a routine prenatal visit: You will be weighed to make sure you and the baby are growing normally. Your blood pressure will be taken. Your abdomen will be measured to track your baby's growth. The fetal heartbeat will be listened to starting around week 10 or 12 of your pregnancy. Test results from any previous visits will be discussed. Your health care provider may ask you: How you are feeling. If you are feeling the baby move. If you have had any abnormal symptoms, such as leaking fluid, bleeding, severe headaches, or abdominal cramping. If you have any questions. Other tests that may be performed during your first trimester include: Blood tests to find your blood type and to check for the presence of any previous infections. They will also be used to check for low iron levels (anemia) and Rh antibodies. Later in the pregnancy, blood tests for diabetes will be done along with other tests if problems develop. Urine tests to check for infections, diabetes, or protein in the urine. An ultrasound to confirm the proper growth and development  of the baby. An amniocentesis to check for possible genetic problems. Fetal screens for spina bifida and Down syndrome. You may need other tests to make sure you and the baby are doing well. HOME CARE INSTRUCTIONS  Medicines Follow your health care provider's instructions regarding medicine use. Specific medicines may be either safe or unsafe to take during pregnancy. Take your prenatal vitamins as directed. If you develop constipation, try taking a stool softener if your health care provider approves. Diet Eat regular, well-balanced meals. Choose a variety of foods, such as meat or vegetable-based protein, fish, milk and low-fat dairy products, vegetables, fruits, and whole grain breads and cereals. Your health care provider will help you determine the amount of weight gain that is right for you. Avoid raw meat and uncooked cheese. These carry germs that can cause birth defects in the baby. Eating four or five small meals rather than three large meals a day may help relieve nausea and vomiting. If you start to feel nauseous, eating a few soda crackers can be helpful. Drinking liquids between meals instead of during meals also seems to help nausea and vomiting. If you develop constipation, eat more high-fiber foods, such as fresh vegetables or fruit and whole grains. Drink enough fluids to keep your urine clear or pale yellow. Activity and Exercise Exercise only as directed by your health care provider. Exercising will help you: Control your weight. Stay in shape. Be prepared for labor and delivery. Experiencing pain or cramping in the lower abdomen or low back is a good sign that you should stop exercising. Check with your health care provider before continuing normal exercises. Try to avoid standing for long periods of time. Move your legs often if you must stand in one place for a long time. Avoid heavy lifting. Wear low-heeled shoes, and practice good posture. You may continue to have sex  unless your health care provider directs you otherwise. Relief of Pain or Discomfort Wear a good support bra for breast tenderness.   Take warm sitz baths to soothe any pain or discomfort caused by hemorrhoids. Use hemorrhoid cream if your health care provider approves.   Rest with your legs elevated if you have leg cramps or low back pain. If you develop varicose veins in your legs, wear support hose. Elevate your feet for 15 minutes, 3-4 times a day. Limit salt in your diet. Prenatal Care Schedule your prenatal visits by the   twelfth week of pregnancy. They are usually scheduled monthly at first, then more often in the last 2 months before delivery. Write down your questions. Take them to your prenatal visits. Keep all your prenatal visits as directed by your health care provider. Safety Wear your seat belt at all times when driving. Make a list of emergency phone numbers, including numbers for family, friends, the hospital, and police and fire departments. General Tips Ask your health care provider for a referral to a local prenatal education class. Begin classes no later than at the beginning of month 6 of your pregnancy. Ask for help if you have counseling or nutritional needs during pregnancy. Your health care provider can offer advice or refer you to specialists for help with various needs. Do not use hot tubs, steam rooms, or saunas. Do not douche or use tampons or scented sanitary pads. Do not cross your legs for long periods of time. Avoid cat litter boxes and soil used by cats. These carry germs that can cause birth defects in the baby and possibly loss of the fetus by miscarriage or stillbirth. Avoid all smoking, herbs, alcohol, and medicines not prescribed by your health care provider. Chemicals in these affect the formation and growth of the baby. Schedule a dentist appointment. At home, brush your teeth with a soft toothbrush and be gentle when you floss. SEEK MEDICAL CARE IF:   You have dizziness. You have mild pelvic cramps, pelvic pressure, or nagging pain in the abdominal area. You have persistent nausea, vomiting, or diarrhea. You have a bad smelling vaginal discharge. You have pain with urination. You notice increased swelling in your face, hands, legs, or ankles. SEEK IMMEDIATE MEDICAL CARE IF:  You have a fever. You are leaking fluid from your vagina. You have spotting or bleeding from your vagina. You have severe abdominal cramping or pain. You have rapid weight gain or loss. You vomit blood or material that looks like coffee grounds. You are exposed to German measles and have never had them. You are exposed to fifth disease or chickenpox. You develop a severe headache. You have shortness of breath. You have any kind of trauma, such as from a fall or a car accident. Document Released: 09/27/2001 Document Revised: 02/17/2014 Document Reviewed: 08/13/2013 ExitCare Patient Information 2015 [employer]. This information is not intended to replace advice given to you by your health care provider. Make sure you discuss any questions you have with your health care provider.  

## 2021-12-29 ENCOUNTER — Encounter: Payer: Self-pay | Admitting: Women's Health

## 2021-12-29 LAB — CERVICOVAGINAL ANCILLARY ONLY
Bacterial Vaginitis (gardnerella): POSITIVE — AB
Candida Glabrata: NEGATIVE
Candida Vaginitis: NEGATIVE
Chlamydia: NEGATIVE
Comment: NEGATIVE
Comment: NEGATIVE
Comment: NEGATIVE
Comment: NEGATIVE
Comment: NEGATIVE
Comment: NORMAL
Neisseria Gonorrhea: NEGATIVE
Trichomonas: NEGATIVE

## 2021-12-29 LAB — URINE CULTURE: Organism ID, Bacteria: NO GROWTH

## 2021-12-29 MED ORDER — METRONIDAZOLE 500 MG PO TABS: 500.0000 mg | ORAL_TABLET | Freq: Two times a day (BID) | ORAL | 0 refills | Status: DC

## 2021-12-29 NOTE — Addendum Note (Signed)
Addended by: Roma Schanz on: 12/29/2021 04:54 PM ? ? Modules accepted: Orders ? ?

## 2022-01-03 LAB — INTEGRATED 1
Crown Rump Length: 66.6 mm
Gest. Age on Collection Date: 12.9 weeks
Maternal Age at EDD: 34.8 yr
Nuchal Translucency (NT): 2.5 mm
Number of Fetuses: 1
PAPP-A Value: 1223.7 ng/mL
Weight: 269 [lb_av]

## 2022-01-03 LAB — COMPREHENSIVE METABOLIC PANEL
ALT: 88 IU/L — ABNORMAL HIGH (ref 0–32)
AST: 64 IU/L — ABNORMAL HIGH (ref 0–40)
Albumin/Globulin Ratio: 1.4 (ref 1.2–2.2)
Albumin: 3.7 g/dL — ABNORMAL LOW (ref 3.8–4.8)
Alkaline Phosphatase: 58 IU/L (ref 44–121)
BUN/Creatinine Ratio: 23 (ref 9–23)
BUN: 10 mg/dL (ref 6–20)
Bilirubin Total: <0.2 mg/dL (ref 0.0–1.2)
CO2: 16 mmol/L — ABNORMAL LOW (ref 20–29)
Calcium: 9 mg/dL (ref 8.7–10.2)
Chloride: 104 mmol/L (ref 96–106)
Creatinine, Ser: 0.44 mg/dL — ABNORMAL LOW (ref 0.57–1.00)
Globulin, Total: 2.7 g/dL (ref 1.5–4.5)
Glucose: 122 mg/dL — ABNORMAL HIGH (ref 70–99)
Potassium: 4.1 mmol/L (ref 3.5–5.2)
Sodium: 137 mmol/L (ref 134–144)
Total Protein: 6.4 g/dL (ref 6.0–8.5)
eGFR: 130 mL/min/{1.73_m2} (ref >59)

## 2022-01-03 LAB — PROTEIN / CREATININE RATIO, URINE
Creatinine, Urine: 171.6 mg/dL
Protein, Ur: 16.4 mg/dL
Protein/Creat Ratio: 96 mg/g creat (ref 0–200)

## 2022-01-03 LAB — CBC/D/PLT+RPR+RH+ABO+RUBIGG...
Antibody Screen: NEGATIVE
Basophils Absolute: 0 10*3/uL (ref 0.0–0.2)
Basos: 1 %
EOS (ABSOLUTE): 0.1 10*3/uL (ref 0.0–0.4)
Eos: 2 %
HCV Ab: NONREACTIVE
HIV Screen 4th Generation wRfx: NONREACTIVE
Hematocrit: 37.1 % (ref 34.0–46.6)
Hemoglobin: 12.7 g/dL (ref 11.1–15.9)
Hepatitis B Surface Ag: NEGATIVE
Immature Grans (Abs): 0 10*3/uL (ref 0.0–0.1)
Immature Granulocytes: 0 %
Lymphocytes Absolute: 2.5 10*3/uL (ref 0.7–3.1)
Lymphs: 40 %
MCH: 31.6 pg (ref 26.6–33.0)
MCHC: 34.2 g/dL (ref 31.5–35.7)
MCV: 92 fL (ref 79–97)
Monocytes Absolute: 0.5 10*3/uL (ref 0.1–0.9)
Monocytes: 7 %
Neutrophils Absolute: 3.1 10*3/uL (ref 1.4–7.0)
Neutrophils: 50 %
Platelets: 208 10*3/uL (ref 150–450)
RBC: 4.02 x10E6/uL (ref 3.77–5.28)
RDW: 12.5 % (ref 11.7–15.4)
RPR Ser Ql: NONREACTIVE
Rh Factor: POSITIVE
Rubella Antibodies, IGG: <0.9 index — ABNORMAL LOW (ref >0.99)
WBC: 6.2 10*3/uL (ref 3.4–10.8)

## 2022-01-03 LAB — HEMOGLOBIN A1C
Est. average glucose Bld gHb Est-mCnc: 131 mg/dL
Hgb A1c MFr Bld: 6.2 % — ABNORMAL HIGH (ref 4.8–5.6)

## 2022-01-03 LAB — HCV INTERPRETATION

## 2022-01-04 ENCOUNTER — Telehealth: Payer: Self-pay | Admitting: Women's Health

## 2022-01-04 ENCOUNTER — Encounter: Payer: Self-pay | Admitting: Women's Health

## 2022-01-04 NOTE — Telephone Encounter (Signed)
Patient called stating that she was prescribed a blood pressure medication, because during her last pregnancy she had High blood pressure. Patient states that she does not know who to take the medication. Patient also states that she is not able to get into her [employer] it keeps informing her that her email has already been used. Patient speaks spanish. ?

## 2022-01-04 NOTE — Telephone Encounter (Signed)
Returned patient's call.  Informed patient Colleen Arnold was still in process and can take 7-10 day to result.  Also informed she has not been prescribed bp medication but was advised to take Aspirin '162mg'$  daily as she has had GHTN during a previous pregnancy.  Pt verbalized understanding with no further questions.  ?

## 2022-01-05 ENCOUNTER — Encounter: Payer: Self-pay | Admitting: Women's Health

## 2022-01-10 ENCOUNTER — Encounter: Payer: Self-pay | Admitting: Women's Health

## 2022-01-10 ENCOUNTER — Other Ambulatory Visit: Payer: Self-pay

## 2022-01-10 DIAGNOSIS — Z131 Encounter for screening for diabetes mellitus: Secondary | ICD-10-CM

## 2022-01-11 ENCOUNTER — Other Ambulatory Visit: Payer: Self-pay | Admitting: Women's Health

## 2022-01-11 ENCOUNTER — Other Ambulatory Visit: Payer: Self-pay | Admitting: *Deleted

## 2022-01-11 ENCOUNTER — Encounter: Payer: Self-pay | Admitting: Women's Health

## 2022-01-11 DIAGNOSIS — O24415 Gestational diabetes mellitus in pregnancy, controlled by oral hypoglycemic drugs: Secondary | ICD-10-CM

## 2022-01-11 DIAGNOSIS — O24419 Gestational diabetes mellitus in pregnancy, unspecified control: Secondary | ICD-10-CM | POA: Insufficient documentation

## 2022-01-11 LAB — GLUCOSE TOLERANCE, 2 HOURS W/ 1HR
Glucose, 1 hour: 235 mg/dL — ABNORMAL HIGH (ref 70–179)
Glucose, 2 hour: 155 mg/dL — ABNORMAL HIGH (ref 70–152)
Glucose, Fasting: 129 mg/dL — ABNORMAL HIGH (ref 70–91)

## 2022-01-11 MED ORDER — ACCU-CHEK SOFTCLIX LANCETS MISC: 12 refills | Status: DC

## 2022-01-11 MED ORDER — METFORMIN HCL 500 MG PO TABS: 500.0000 mg | ORAL_TABLET | Freq: Two times a day (BID) | ORAL | 3 refills | Status: DC

## 2022-01-11 MED ORDER — ACCU-CHEK GUIDE VI STRP: ORAL_STRIP | 12 refills | Status: DC

## 2022-01-24 ENCOUNTER — Ambulatory Visit (INDEPENDENT_AMBULATORY_CARE_PROVIDER_SITE_OTHER): Payer: Self-pay | Admitting: Obstetrics & Gynecology

## 2022-01-24 ENCOUNTER — Encounter: Payer: Self-pay | Admitting: Obstetrics & Gynecology

## 2022-01-24 ENCOUNTER — Ambulatory Visit (INDEPENDENT_AMBULATORY_CARE_PROVIDER_SITE_OTHER): Payer: Self-pay | Admitting: Registered"

## 2022-01-24 ENCOUNTER — Encounter: Payer: Self-pay | Attending: Women's Health | Admitting: Registered"

## 2022-01-24 VITALS — BP 112/66 | Wt 272.8 lb

## 2022-01-24 DIAGNOSIS — Z713 Dietary counseling and surveillance: Secondary | ICD-10-CM | POA: Insufficient documentation

## 2022-01-24 DIAGNOSIS — O24415 Gestational diabetes mellitus in pregnancy, controlled by oral hypoglycemic drugs: Secondary | ICD-10-CM | POA: Insufficient documentation

## 2022-01-24 DIAGNOSIS — Z331 Pregnant state, incidental: Secondary | ICD-10-CM

## 2022-01-24 DIAGNOSIS — O24419 Gestational diabetes mellitus in pregnancy, unspecified control: Secondary | ICD-10-CM

## 2022-01-24 DIAGNOSIS — Z1379 Encounter for other screening for genetic and chromosomal anomalies: Secondary | ICD-10-CM

## 2022-01-24 DIAGNOSIS — O24112 Pre-existing diabetes mellitus, type 2, in pregnancy, second trimester: Secondary | ICD-10-CM

## 2022-01-24 DIAGNOSIS — Z1389 Encounter for screening for other disorder: Secondary | ICD-10-CM

## 2022-01-24 DIAGNOSIS — Z3480 Encounter for supervision of other normal pregnancy, unspecified trimester: Secondary | ICD-10-CM

## 2022-01-24 LAB — POCT URINALYSIS DIPSTICK OB
Blood, UA: 2
Glucose, UA: NEGATIVE
Leukocytes, UA: NEGATIVE
Nitrite, UA: NEGATIVE
POC,PROTEIN,UA: NEGATIVE

## 2022-01-24 MED ORDER — METFORMIN HCL 500 MG PO TABS: ORAL_TABLET | ORAL | 4 refills | Status: DC

## 2022-01-24 NOTE — Progress Notes (Signed)
? ?HIGH-RISK PREGNANCY VISIT ?Patient name: Colleen Arnold MRN [id number]  Date of birth: 01-27-1986 ?Chief Complaint:   ?High Risk Gestation and Routine Prenatal Visit (2nd IT) ? ?History of Present Illness:   ?Colleen Arnold is a 36 y.o. G37P2002 female at 61w2dwith an Estimated Date of Delivery: 07/09/22 being seen today for ongoing management of a high-risk pregnancy complicated by: ? ?-class B DM- on metformin '500mg'$  bid ?Brought a few days of sugars- all fastings >100s, PP within normal range ? ?Today she reports no complaints.  ? ?Contractions: Not present.  .  Movement: Absent. denies leaking of fluid.  ? ?Spanish interpreter present ? ? ?  12/27/2021  ?  3:45 PM 08/04/2020  ?  8:36 AM 07/27/2020  ?  9:29 AM 04/27/2020  ? 10:35 AM  ?Depression screen PHQ 2/9  ?Decreased Interest 0 0 0 0  ?Down, Depressed, Hopeless 0 0 0 0  ?PHQ - 2 Score 0 0 0 0  ?Altered sleeping '1  1 1  '$ ?Tired, decreased energy '1  1 1  '$ ?Change in appetite 0  0 0  ?Feeling bad or failure about yourself  0  0 0  ?Trouble concentrating 0  0 0  ?Moving slowly or fidgety/restless 0  0 0  ?Suicidal thoughts 0  0 0  ?PHQ-9 Score '2  2 2  '$ ? ? ? ?Current Outpatient Medications  ?Medication Instructions  ? Accu-Chek Softclix Lancets lancets Check blood sugar four times a day.  ? acetaminophen (TYLENOL) 1,000 mg, As needed  ? aspirin 162 mg, Oral, Daily, Swallow whole.  ? glucose blood (ACCU-CHEK GUIDE) test strip Check blood sugar four times a day.  ? metFORMIN (GLUCOPHAGE) 500 MG tablet One tablet with breakfast, two tablets with dinner  ? metroNIDAZOLE (FLAGYL) 500 mg, Oral, 2 times daily  ? pantoprazole (PROTONIX) 20 mg, Oral, Daily  ? prenatal vitamin w/FE, FA (PRENATAL 1 + 1) 27-1 MG TABS tablet 1 tablet, Oral, Daily  ?  ? ?Review of Systems:   ?Pertinent items are noted in HPI ?Denies abnormal vaginal discharge w/ itching/odor/irritation, headaches, visual changes, shortness of breath, chest pain, abdominal pain, severe nausea/vomiting, or problems  with urination or bowel movements unless otherwise stated above. ?Pertinent History Reviewed:  ?Reviewed past medical,surgical, social, obstetrical and family history.  ?Reviewed problem list, medications and allergies. ?Physical Assessment:  ? ?Vitals:  ? 01/24/22 0924  ?BP: 112/66  ?Weight: 272 lb 12.8 oz (123.7 kg)  ?Body mass index is 48.32 kg/m?. ?     ?     Physical Examination:  ? General appearance: alert, well appearing, and in no distress ? Mental status: normal mood, behavior, speech, dress, motor activity, and thought processes ? Skin: warm & dry  ? Extremities: Edema: None  ?  Cardiovascular: normal heart rate noted ? Respiratory: normal respiratory effort, no distress ? Abdomen: gravid, soft, non-tender ? Pelvic: Cervical exam deferred        ? ?Fetal Status: Fetal Heart Rate (bpm): 155   Movement: Absent   ? ?Fetal Surveillance Testing today: doppler  ? ?Chaperone: N/A   ? ?Results for orders placed or performed in visit on 01/24/22 (from the past 24 hour(s))  ?POC Urinalysis Dipstick OB  ? Collection Time: 01/24/22  9:45 AM  ?Result Value Ref Range  ? Color, UA    ? Clarity, UA    ? Glucose, UA Negative Negative  ? Bilirubin, UA    ? Ketones, UA small   ? Spec Grav,  UA    ? Blood, UA 2   ? pH, UA    ? POC,PROTEIN,UA Negative Negative, Trace, Small (1+), Moderate (2+), Large (3+), 4+  ? Urobilinogen, UA    ? Nitrite, UA neg   ? Leukocytes, UA Negative Negative  ? Appearance    ? Odor    ?  ? ?Assessment & Plan:  ?High-risk pregnancy: G3P2002 at 10w2dwith an Estimated Date of Delivery: 07/09/22  ? ?1) Class B DM- increase metformin to 2 tabs at night, one tab with breakfast ?-pt has appt today in [place] to review sugars ?-reviewed timing of accuchecks ? ?2) Contraception- does not want another pregnancy ?-discussed tubal ligation via salpingectomy ?-pt reports prior unilateral salpingo-oophrectomy ?-reviewed risk/benefit and alternatives- questions and concerns were addressed, she desires to proceed with  salpingectomy at the time of her C-section ? ? ?Meds:  ?Meds ordered this encounter  ?Medications  ? metFORMIN (GLUCOPHAGE) 500 MG tablet  ?  Sig: One tablet with breakfast, two tablets with dinner  ?  Dispense:  90 tablet  ?  Refill:  4  ? ? ?Labs/procedures today: doppler ? ?Treatment Plan:  increased metformin, IT-2 today, anatomy scan next visit ? ?Reviewed: Preterm labor symptoms and general obstetric precautions including but not limited to vaginal bleeding, contractions, leaking of fluid and fetal movement were reviewed in detail with the patient.  All questions were answered.  ? ?Follow-up: Return in about 4 weeks (around 02/21/2022) for HROB visit and anatomy scan,m '[]'$  tubal paperwork. ? ? ?Future Appointments  ?Date Time Provider [place] ?01/24/2022 11:15 AM [facility]  ?02/21/2022  8:30 AM [facility] [place] None  ?02/21/2022  9:30 AM Eure, LMertie Clause MD [facility]  ? ? ?Orders Placed This Encounter  ?Procedures  ? INTEGRATED 2  ? POC Urinalysis Dipstick OB  ? ? ?JJanyth Pupa DO ?[facility]ending [facility]? ? ? ?

## 2022-01-24 NOTE — Progress Notes (Signed)
[place] Contract interpreter Raquel ? ?Patient was seen for Gestational Diabetes self-management on 01/24/22  ?Start time 1118 and End time 1215  ? ?Estimated due date: 07/09/22; [redacted]w[redacted]d? ?Clinical: ?Medications: metformin 500 mg/dL 1 with breakfast & 2 evening meal ?Medical History: GDM with prior pregnancies ?Labs: OGTT [id number] A1c 6.2%  ? ?Dietary and Lifestyle History: ?Pt reports managed GDM with diet first pregnancy and with metformin 2nd pregnancies. Pt states she drinks sodas on the weekends. Pt states she has an air fryer. ? ?Pt reports breakfast and dinner are larger meals, usually something light for lunch. ? ?Physical Activity: not assessed ?Stress: not assessed ?Sleep: not assessed ? ?24 hr Recall:  ?First Meal: egg, avocado, spinach sandwich, coffee with a little hazelnut creamer ?Snack: fruit ?Second meal: something small ?Snack: ?Third meal: Menudo soup with hominy ?Snack: ?Beverages: water, coffee, squirt on the weekeneds ? ?NUTRITION INTERVENTION  ?Nutrition education (E-1) on the following topics:  ? ?Initial Follow-up ? ?'[]'$  '[]'$  Definition of Gestational Diabetes ?'[]'$  '[]'$  Why dietary management is important in controlling blood glucose ?'[x]'$  '[]'$  Effects each nutrient has on blood glucose levels ?'[]'$  '[]'$  Simple carbohydrates vs complex carbohydrates ?'[x]'$  '[]'$  Fluid intake ?'[x]'$  '[]'$  Creating a balanced meal plan ?'[x]'$  '[]'$  Carbohydrate counting  ?'[x]'$  '[]'$  When to check blood glucose levels ?'[x]'$  '[]'$  Proper blood glucose monitoring techniques ?'[]'$  '[]'$  Effect of stress and stress reduction techniques  ?'[]'$  '[]'$  Exercise effect on blood glucose levels, appropriate exercise during pregnancy ?'[x]'$  '[]'$  Importance of limiting caffeine and abstaining from alcohol and smoking ?'[x]'$  '[]'$  Medications used for blood sugar control during pregnancy ?'[]'$  '[]'$  Hypoglycemia and rule of 15 ?'[]'$  '[]'$  Postpartum self care ? ?Blood glucose monitor given: Prodigy ?Lot # [id number]?CBG: 124 mg/dL (fasting) ? ?Patient instructed to monitor  glucose levels: ?FBS: 60 - ? 95 mg/dL (some clinics use 90 for cutoff) ?1 hour: ? 140 mg/dL ?2 hour: ? 120 mg/dL ? ?Patient received handouts: ?Nutrition Diabetes and Pregnancy ?Carbohydrate Counting List ? ?Patient will be seen for follow-up as needed.  ?

## 2022-01-26 LAB — INTEGRATED 2
AFP MoM: 0.94
Alpha-Fetoprotein: 20.1 ng/mL
Crown Rump Length: 66.6 mm
DIA MoM: 0.78
DIA Value: 86.1 pg/mL
Estriol, Unconjugated: 0.82 ng/mL
Gest. Age on Collection Date: 12.9 weeks
Gestational Age: 16.7 weeks
Maternal Age at EDD: 34.8 yr
Nuchal Translucency (NT): 2.5 mm
Nuchal Translucency MoM: 1.67
Number of Fetuses: 1
PAPP-A MoM: 2.21
PAPP-A Value: 1223.7 ng/mL
Test Results:: NEGATIVE
Weight: 269 [lb_av]
Weight: 269 [lb_av]
hCG MoM: 0.38
hCG Value: 7.6 IU/mL
uE3 MoM: 0.93

## 2022-02-02 ENCOUNTER — Encounter (HOSPITAL_COMMUNITY): Payer: Self-pay | Admitting: Emergency Medicine

## 2022-02-02 ENCOUNTER — Emergency Department (HOSPITAL_COMMUNITY): Payer: Self-pay

## 2022-02-02 ENCOUNTER — Emergency Department (HOSPITAL_COMMUNITY)
Admission: EM | Admit: 2022-02-02 | Discharge: 2022-02-02 | Disposition: A | Discharge status: Home / Self Care | Payer: Self-pay | Attending: Emergency Medicine | Admitting: Emergency Medicine

## 2022-02-02 ENCOUNTER — Other Ambulatory Visit: Payer: Self-pay

## 2022-02-02 ENCOUNTER — Telehealth: Payer: Self-pay | Admitting: *Deleted

## 2022-02-02 DIAGNOSIS — R200 Anesthesia of skin: Secondary | ICD-10-CM | POA: Insufficient documentation

## 2022-02-02 DIAGNOSIS — Z3A17 17 weeks gestation of pregnancy: Secondary | ICD-10-CM | POA: Insufficient documentation

## 2022-02-02 DIAGNOSIS — O26892 Other specified pregnancy related conditions, second trimester: Secondary | ICD-10-CM | POA: Insufficient documentation

## 2022-02-02 DIAGNOSIS — O99891 Other specified diseases and conditions complicating pregnancy: Secondary | ICD-10-CM | POA: Insufficient documentation

## 2022-02-02 DIAGNOSIS — M79601 Pain in right arm: Secondary | ICD-10-CM | POA: Insufficient documentation

## 2022-02-02 LAB — BASIC METABOLIC PANEL
Anion gap: 8 (ref 5–15)
BUN: 9 mg/dL (ref 6–20)
CO2: 21 mmol/L — ABNORMAL LOW (ref 22–32)
Calcium: 8.5 mg/dL — ABNORMAL LOW (ref 8.9–10.3)
Chloride: 107 mmol/L (ref 98–111)
Creatinine, Ser: 0.45 mg/dL (ref 0.44–1.00)
GFR, Estimated: >60 mL/min (ref >60)
Glucose, Bld: 124 mg/dL — ABNORMAL HIGH (ref 70–99)
Potassium: 3.6 mmol/L (ref 3.5–5.1)
Sodium: 136 mmol/L (ref 135–145)

## 2022-02-02 LAB — CBC
HCT: 34.6 % — ABNORMAL LOW (ref 36.0–46.0)
Hemoglobin: 11.9 g/dL — ABNORMAL LOW (ref 12.0–15.0)
MCH: 32.7 pg (ref 26.0–34.0)
MCHC: 34.4 g/dL (ref 30.0–36.0)
MCV: 95.1 fL (ref 80.0–100.0)
Platelets: 202 10*3/uL (ref 150–400)
RBC: 3.64 MIL/uL — ABNORMAL LOW (ref 3.87–5.11)
RDW: 13.1 % (ref 11.5–15.5)
WBC: 5.3 10*3/uL (ref 4.0–10.5)
nRBC: 0 % (ref 0.0–0.2)

## 2022-02-02 MED ORDER — PREDNISONE 20 MG PO TABS: 40.0000 mg | ORAL_TABLET | Freq: Every day | ORAL | 0 refills | Status: DC

## 2022-02-02 MED ORDER — KETOROLAC TROMETHAMINE 60 MG/2ML IM SOLN: 60.0000 mg | Freq: Once | INTRAMUSCULAR | Status: DC

## 2022-02-02 NOTE — Discharge Instructions (Signed)
The MRI of your spine was good and showed no signs of a pinched nerve however I suspect that the pain that is in your arm is likely coming from a nerve issue in your arm.  I do want you to follow-up with a neurologist, I have given you their phone number, please call the phone number for Dr. Merlene Laughter and make an appointment for the next week.  Return to the ER for severe worsening pain or weakness.  Please take prednisone daily for the next 5 days ?

## 2022-02-02 NOTE — Telephone Encounter (Signed)
-----   Message from Jari Pigg sent at 02/02/2022  4:08 PM EDT ----- ?Regarding: MRI ?This pt can feel her arm and said she has limited arm movement and wants to know what to do she went to [facility] yesterday and they want to do an mri however shes pregnant [phone] ? ?

## 2022-02-02 NOTE — ED Triage Notes (Signed)
Pt to the ED with complaints of right arm pain with numbness and tingling that became worse last night. Pt states she has dropped a glass because she could no longer feel her hand. ? ? ?The pt is 17weeks and 4 days pregnant. ?

## 2022-02-02 NOTE — ED Provider Notes (Signed)
?[facility] ?Provider Note ? ? ?CSN: [id number] ?Arrival date & time: 02/02/22  0825 ? ?  ? ?History ? ?Chief Complaint  ?Patient presents with  ? Arm Injury  ? ? ?Colleen Arnold is a 36 y.o. female. ? ? ?Arm Injury ? ?This patient is a 36 year old female, she is approximately [redacted] weeks pregnant, this is her third pregnancy.  She has a history of diabetes on metformin and a prenatal vitamin.  She presents to the hospital today after having several days of a numbness and tingling feeling in her right arm, she states it felt like it was asleep, it has been rather constant but at 1:00 in the morning about 7-1/2 hours ago she developed acute onset of pain in the right shoulder radiating all the way down the arm as well.  She noted that this morning she had difficulty holding a glass because of the pain in her arm.  She denies any symptoms in her face or her legs, she has been able to ambulate without difficulty and has not had any fevers or chills.  She does have some neck discomfort.  She does not recall an injury.  She has never had anything like this before. ? ?Home Medications ?Prior to Admission medications   ?Medication Sig Start Date End Date Taking? Authorizing Provider  ?predniSONE (DELTASONE) 20 MG tablet Take 2 tablets (40 mg total) by mouth daily. 02/02/22  Yes Noemi Chapel, MD  ?Accu-Chek Softclix Lancets lancets Check blood sugar four times a day. 01/11/22   Roma Schanz, CNM  ?acetaminophen (TYLENOL) 500 MG tablet Take 1,000 mg by mouth as needed. ?Patient not taking: Reported on 01/24/2022    [provider]  ?aspirin 81 MG EC tablet Take 2 tablets (162 mg total) by mouth daily. Swallow whole. 12/27/21   Roma Schanz, CNM  ?glucose blood (ACCU-CHEK GUIDE) test strip Check blood sugar four times a day. 01/11/22   Roma Schanz, CNM  ?metFORMIN (GLUCOPHAGE) 500 MG tablet One tablet with breakfast, two tablets with dinner 01/24/22   Janyth Pupa, DO  ?metroNIDAZOLE  (FLAGYL) 500 MG tablet Take 1 tablet (500 mg total) by mouth 2 (two) times daily. ?Patient not taking: Reported on 01/24/2022 12/29/21   Roma Schanz, CNM  ?pantoprazole (PROTONIX) 20 MG tablet Take 1 tablet (20 mg total) by mouth daily. 12/27/21   Roma Schanz, CNM  ?prenatal vitamin w/FE, FA (PRENATAL 1 + 1) 27-1 MG TABS tablet Take 1 tablet by mouth daily at 12 noon. 08/09/21   Roma Schanz, CNM  ?   ? ?Allergies    ?Rocephin [ceftriaxone sodium in dextrose]   ? ?Review of Systems   ?Review of Systems  ?All other systems reviewed and are negative. ? ?Physical Exam ?Updated Vital Signs ?BP 129/78   Pulse 79   Temp 98.1 ?F (36.7 ?C) (Oral)   Resp 16   Ht 1.6 m ('5\' 3"'$ )   Wt 122 kg   LMP 09/24/2021 (Exact Date)   SpO2 100%   BMI 47.65 kg/m?  ?Physical Exam ?Vitals and nursing note reviewed.  ?Constitutional:   ?   General: She is not in acute distress. ?   Appearance: She is well-developed.  ?HENT:  ?   Head: Normocephalic and atraumatic.  ?   Mouth/Throat:  ?   Pharynx: No oropharyngeal exudate.  ?Eyes:  ?   General: No scleral icterus.    ?   Right eye: No discharge.     ?  Left eye: No discharge.  ?   Conjunctiva/sclera: Conjunctivae normal.  ?   Pupils: Pupils are equal, round, and reactive to light.  ?Neck:  ?   Thyroid: No thyromegaly.  ?   Vascular: No JVD.  ?Cardiovascular:  ?   Rate and Rhythm: Normal rate and regular rhythm.  ?   Heart sounds: Normal heart sounds. No murmur heard. ?  No friction rub. No gallop.  ?Pulmonary:  ?   Effort: Pulmonary effort is normal. No respiratory distress.  ?   Breath sounds: Normal breath sounds. No wheezing or rales.  ?Abdominal:  ?   General: Bowel sounds are normal. There is no distension.  ?   Palpations: Abdomen is soft. There is no mass.  ?   Tenderness: There is no abdominal tenderness.  ?Musculoskeletal:     ?   General: No tenderness. Normal range of motion.  ?   Cervical back: Normal range of motion and neck supple.  ?Lymphadenopathy:  ?    Cervical: No cervical adenopathy.  ?Skin: ?   General: Skin is warm and dry.  ?   Findings: No erythema or rash.  ?Neurological:  ?   Mental Status: She is alert.  ?   Coordination: Coordination normal.  ?   Comments: Cranial nerves III through XII are normal, there is no asymmetry of the face normal sensation of the face normal speech.  She has totally normal sensation and strength of the bilateral lower extremities, her right upper extremity has normal appearance it is not swollen, the bilateral upper extremities have totally normal strength at the shoulders elbows to both extension and flexion as well as the wrist and the grips.  There is no asymmetry.  She has decreased sensation of the right arm which appears to be more stocking glove and does not seem to involve her dermatomal distribution.  She has normal reflexes at the brachioradialis.  Normal pulses at the right hand, normal capillary refill of the right hand.  ?Psychiatric:     ?   Behavior: Behavior normal.  ? ? ?ED Results / Procedures / Treatments   ?Labs ?(all labs ordered are listed, but only abnormal results are displayed) ?Labs Reviewed  ?CBC - Abnormal; Notable for the following components:  ?    Result Value  ? RBC 3.64 (*)   ? Hemoglobin 11.9 (*)   ? HCT 34.6 (*)   ? All other components within normal limits  ?BASIC METABOLIC PANEL - Abnormal; Notable for the following components:  ? CO2 21 (*)   ? Glucose, Bld 124 (*)   ? Calcium 8.5 (*)   ? All other components within normal limits  ? ? ?EKG ?None ? ?Radiology ?MR CERVICAL SPINE WO CONTRAST ? ?Result Date: 02/02/2022 ?CLINICAL DATA:  Cervical radiculopathy EXAM: MRI CERVICAL SPINE WITHOUT CONTRAST TECHNIQUE: Multiplanar, multisequence MR imaging of the cervical spine was performed. No intravenous contrast was administered. COMPARISON:  None. FINDINGS: Alignment: Physiologic. Vertebrae: No fracture, evidence of discitis, or bone lesion. Cord: Normal signal and morphology. Posterior Fossa,  vertebral arteries, paraspinal tissues: Negative. Disc levels: Diffusely preserved disc height and hydration. No significant facet spurring. Tiny disc protrusions may be present at C6-7 and C5-6 based on left parasagittal images. IMPRESSION: No explanation for radiculopathy. Negative for impingement or inflammation. Electronically Signed   By: Jorje Guild M.D.   On: 02/02/2022 10:25   ? ?Procedures ?Procedures  ? ? ?Medications Ordered in ED ?Medications - No data to display ? ?  ED Course/ Medical Decision Making/ A&P ?  ?                        ?Medical Decision Making ?Amount and/or Complexity of Data Reviewed ?Labs: ordered. ?Radiology: ordered. ? ?Risk ?Prescription drug management. ? ? ?Neck discomfort with right arm pain and paresthesias suggestive of a radiculopathy.  We will obtain MRI of the neck given the progression of the symptoms, there is no weakness on my exam and I suspect that the reason that she dropped the glass this morning was because of the pain in her arm and she does endorse that it caused pain which caused her to drop it.  There is no objective weakness.  She does have objective numbness but no decrease in reflexes.  This is an isolated finding as there is no cranial nerve issues or leg issues.  Patient agreeable to the work-up ? ?Imaging: I personally viewed and interpreted the MRI of the neck, there does not appear to be any signs of radiculopathy or spinal cord injury.  I agree with the radiologist interpretation. ? ?Medication management: The patient was prescribed prednisone for 5 days to help with any potential nerve impingement in the arm, the burning pain with associated tingling in the hand is suggestive of some type of nerve pain however there does not appear to be a surgical cause.  The patient is agreeable and understands that this may cause some potential risk to the pregnancy but is willing to accept that risk. ? ?ED course: Patient is very stable has no numbness or weakness  on my exam, just pain with moving the arm.  There is no signs of trauma and I do not think she needs any other imaging or admission to the hospital. ? ?She was given neurology follow-up information ? ? ? ? ? ? ? ?F

## 2022-02-02 NOTE — Telephone Encounter (Signed)
Pt is having limited arm movement and numbness. Pt was seen at [facility]. Pt had MRI and everything was normal. I spoke with Dr. Nelda Marseille. Pt was advised if her arm gets worse, go to [facility]. Pt voiced understanding. JSY ?

## 2022-02-18 ENCOUNTER — Other Ambulatory Visit: Payer: Self-pay | Admitting: Obstetrics & Gynecology

## 2022-02-18 DIAGNOSIS — Z363 Encounter for antenatal screening for malformations: Secondary | ICD-10-CM

## 2022-02-21 ENCOUNTER — Ambulatory Visit (INDEPENDENT_AMBULATORY_CARE_PROVIDER_SITE_OTHER): Payer: Self-pay | Admitting: Obstetrics & Gynecology

## 2022-02-21 ENCOUNTER — Encounter: Payer: Self-pay | Admitting: Obstetrics & Gynecology

## 2022-02-21 ENCOUNTER — Ambulatory Visit (INDEPENDENT_AMBULATORY_CARE_PROVIDER_SITE_OTHER): Payer: Self-pay

## 2022-02-21 VITALS — BP 139/92 | HR 81 | Wt 272.0 lb

## 2022-02-21 DIAGNOSIS — Z98891 History of uterine scar from previous surgery: Secondary | ICD-10-CM

## 2022-02-21 DIAGNOSIS — Z3A20 20 weeks gestation of pregnancy: Secondary | ICD-10-CM

## 2022-02-21 DIAGNOSIS — O0992 Supervision of high risk pregnancy, unspecified, second trimester: Secondary | ICD-10-CM

## 2022-02-21 DIAGNOSIS — Z363 Encounter for antenatal screening for malformations: Secondary | ICD-10-CM

## 2022-02-21 DIAGNOSIS — O24415 Gestational diabetes mellitus in pregnancy, controlled by oral hypoglycemic drugs: Secondary | ICD-10-CM

## 2022-02-21 DIAGNOSIS — Z3480 Encounter for supervision of other normal pregnancy, unspecified trimester: Secondary | ICD-10-CM

## 2022-02-21 LAB — POCT URINALYSIS DIPSTICK OB
Glucose, UA: NEGATIVE
Leukocytes, UA: NEGATIVE
Nitrite, UA: NEGATIVE
POC,PROTEIN,UA: NEGATIVE

## 2022-02-21 NOTE — Progress Notes (Signed)
? ? ?HIGH-RISK PREGNANCY VISIT ?Patient name: Colleen Arnold MRN [id number]  Date of birth: 1986/05/01 ?Chief Complaint:   ?Routine Prenatal Visit (Recent right arm pain-finished steroids) ? ?History of Present Illness:   ?Colleen Arnold is a 36 y.o. G102P2002 female at 56w2dwith an Estimated Date of Delivery: 07/09/22 being seen today for ongoing management of a high-risk pregnancy complicated by Class AB1/DDM.   ? ?Today she reports no complaints. Contractions: Not present. Vag. Bleeding: None.  Movement: Present. denies leaking of fluid.  ? ? ?  12/27/2021  ?  3:45 PM 08/04/2020  ?  8:36 AM 07/27/2020  ?  9:29 AM 04/27/2020  ? 10:35 AM  ?Depression screen PHQ 2/9  ?Decreased Interest 0 0 0 0  ?Down, Depressed, Hopeless 0 0 0 0  ?PHQ - 2 Score 0 0 0 0  ?Altered sleeping '1  1 1  '$ ?Tired, decreased energy '1  1 1  '$ ?Change in appetite 0  0 0  ?Feeling bad or failure about yourself  0  0 0  ?Trouble concentrating 0  0 0  ?Moving slowly or fidgety/restless 0  0 0  ?Suicidal thoughts 0  0 0  ?PHQ-9 Score '2  2 2  '$ ? ?  ? ?  12/27/2021  ?  3:45 PM 07/27/2020  ?  9:29 AM 04/27/2020  ? 10:35 AM  ?GAD 7 : Generalized Anxiety Score  ?Nervous, Anxious, on Edge 0 0 0  ?Control/stop worrying 0 1 0  ?Worry too much - different things 0 1 0  ?Trouble relaxing 0 0 0  ?Restless 0 0 0  ?Easily annoyed or irritable 0 0 0  ?Afraid - awful might happen 0 1 0  ?Total GAD 7 Score 0 3 0  ? ? ? ?Review of Systems:   ?Pertinent items are noted in HPI ?Denies abnormal vaginal discharge w/ itching/odor/irritation, headaches, visual changes, shortness of breath, chest pain, abdominal pain, severe nausea/vomiting, or problems with urination or bowel movements unless otherwise stated above. ?Pertinent History Reviewed:  ?Reviewed past medical,surgical, social, obstetrical and family history.  ?Reviewed problem list, medications and allergies. ?Physical Assessment:  ? ?Vitals:  ? 02/21/22 0936  ?BP: (!) 139/92  ?Pulse: 81  ?Weight: 272 lb (123.4 kg)  ?Body  mass index is 48.18 kg/m?. ?     ?     Physical Examination:  ? General appearance: alert, well appearing, and in no distress ? Mental status: alert, oriented to person, place, and time ? Skin: warm & dry  ? Extremities: Edema: None  ?  Cardiovascular: normal heart rate noted ? Respiratory: normal respiratory effort, no distress ? Abdomen: gravid, soft, non-tender ? Pelvic: Cervical exam deferred        ? ?Fetal Status:     Movement: Present   ? ?Fetal Surveillance Testing today: sonogram  ? ?Chaperone: N/A   ? ?Results for orders placed or performed in visit on 02/21/22 (from the past 24 hour(s))  ?POC Urinalysis Dipstick OB  ? Collection Time: 02/21/22  9:46 AM  ?Result Value Ref Range  ? Color, UA    ? Clarity, UA    ? Glucose, UA Negative Negative  ? Bilirubin, UA    ? Ketones, UA moderate   ? Spec Grav, UA    ? Blood, UA small   ? pH, UA    ? POC,PROTEIN,UA Negative Negative, Trace, Small (1+), Moderate (2+), Large (3+), 4+  ? Urobilinogen, UA    ? Nitrite, UA neg   ?  Leukocytes, UA Negative Negative  ? Appearance    ? Odor    ?  ?Assessment & Plan:  ?High-risk pregnancy: G3P2002 at 38w2dwith an Estimated Date of Delivery: 07/09/22  ? ? ?  ICD-10-CM   ?1. Supervision of high risk pregnancy in second trimester  O09.92   ?  ?2. 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 suboptimal fasting but takes it 14-16 hours before checking fasting.  Will switch to 1000 mg at bedtime(2200) and am dose at 1000  ?  ?3. Previous cesarean section  Z98.891   ? for repeat + BTL  ?  ?  ? ? ?Meds: No orders of the defined types were placed in this encounter. ? ? ?Orders:  ?Orders Placed This Encounter  ?Procedures  ? POC Urinalysis Dipstick OB  ?  ? ?Labs/procedures today: sonogram ?Treatment Plan:  routine care for A2/B diabetic ? ? ? ?Follow-up: Return in about 4 weeks (around 03/21/2022) for [place] ? ? ?Future Appointments  ?Date Time Provider [place] ?03/21/2022 10:10 AM BRoma Schanz CNM  [facility]  ? ? ?Orders Placed This Encounter  ?Procedures  ? POC Urinalysis Dipstick OB  ? ?LLoma ?Attending Physician for the [facility] ?02/21/2022 ?10:15 AM ? ?

## 2022-02-21 NOTE — Progress Notes (Signed)
[place] 20+2 wks,breech,posterior placenta gr 0,svp of fluid 4.8 cm,normal right ovary,left oophorectomy,limited view of heart because of fetal position,need RVOT,echogenic material within fetal stomach 1.2 x 1.2 x .8 cm,posterior placenta gr 0,FHR 159 bpm,efw 383 g 76%,CX 4.3 cm ?

## 2022-03-21 ENCOUNTER — Encounter: Payer: Self-pay | Admitting: Women's Health

## 2022-03-22 ENCOUNTER — Ambulatory Visit (INDEPENDENT_AMBULATORY_CARE_PROVIDER_SITE_OTHER): Payer: Self-pay | Admitting: Obstetrics & Gynecology

## 2022-03-22 ENCOUNTER — Encounter: Payer: Self-pay | Admitting: Obstetrics & Gynecology

## 2022-03-22 VITALS — BP 128/74 | HR 92

## 2022-03-22 DIAGNOSIS — O0992 Supervision of high risk pregnancy, unspecified, second trimester: Secondary | ICD-10-CM

## 2022-03-22 DIAGNOSIS — O26899 Other specified pregnancy related conditions, unspecified trimester: Secondary | ICD-10-CM

## 2022-03-22 DIAGNOSIS — O24415 Gestational diabetes mellitus in pregnancy, controlled by oral hypoglycemic drugs: Secondary | ICD-10-CM

## 2022-03-22 DIAGNOSIS — R102 Pelvic and perineal pain: Secondary | ICD-10-CM

## 2022-03-22 LAB — POCT URINALYSIS DIPSTICK OB
Blood, UA: NEGATIVE
Glucose, UA: NEGATIVE
Ketones, UA: NEGATIVE
Leukocytes, UA: NEGATIVE
Nitrite, UA: NEGATIVE
POC,PROTEIN,UA: NEGATIVE

## 2022-03-22 MED ORDER — NUVESSA 1.3 % VA GEL: 1.0000 | Freq: Once | VAGINAL | 1 refills | Status: AC

## 2022-03-22 NOTE — Progress Notes (Signed)
HIGH-RISK PREGNANCY VISIT Patient name: Colleen Arnold MRN [id number]  Date of birth: Jan 28, 1986 Chief Complaint:   vaginal pressure ("Baby pushing down")  History of Present Illness:   Colleen Arnold is a 36 y.o. 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    Today she reports pressure. Contractions: Not present. Vag. Bleeding: None.  Movement: Present. denies leaking of fluid.      12/27/2021    3:45 PM 08/04/2020    8:36 AM 07/27/2020    9:29 AM 04/27/2020   10:35 AM  Depression screen PHQ 2/9  Decreased Interest 0 0 0 0  Down, Depressed, Hopeless 0 0 0 0  PHQ - 2 Score 0 0 0 0  Altered sleeping '1  1 1  '$ Tired, decreased energy '1  1 1  '$ Change in appetite 0  0 0  Feeling bad or failure about yourself  0  0 0  Trouble concentrating 0  0 0  Moving slowly or fidgety/restless 0  0 0  Suicidal thoughts 0  0 0  PHQ-9 Score '2  2 2        '$ 12/27/2021    3:45 PM 07/27/2020    9:29 AM 04/27/2020   10:35 AM  GAD 7 : Generalized Anxiety Score  Nervous, Anxious, on Edge 0 0 0  Control/stop worrying 0 1 0  Worry too much - different things 0 1 0  Trouble relaxing 0 0 0  Restless 0 0 0  Easily annoyed or irritable 0 0 0  Afraid - awful might happen 0 1 0  Total GAD 7 Score 0 3 0     Review of Systems:   Pertinent items are noted in HPI Denies abnormal vaginal discharge w/ itching/odor/irritation, headaches, visual changes, shortness of breath, chest pain, abdominal pain, severe nausea/vomiting, or problems with urination or bowel movements unless otherwise stated above. Pertinent History Reviewed:  Reviewed past medical,surgical, social, obstetrical and family history.  Reviewed problem list, medications and allergies. Physical Assessment:   Vitals:   03/22/22 1546  BP: 128/74  Pulse: 92  There is no height or weight on file to calculate BMI.           Physical  Examination:   General appearance: alert, well appearing, and in no distress  Mental status: alert, oriented to person, place, and time  Skin: warm & dry   Extremities: Edema: None    Cardiovascular: normal heart rate noted  Respiratory: normal respiratory effort, no distress  Abdomen: gravid, soft, non-tender  Pelvic: Cervical exam performed         Fetal Status: Fetal Heart Rate (bpm): 160 Fundal Height: 26 cm Movement: Present    Fetal Surveillance Testing today: FHR 160   Chaperone: N/A    Results for orders placed or performed in visit on 03/22/22 (from the past 24 hour(s))  POC Urinalysis Dipstick OB   Collection Time: 03/22/22  3:47 PM  Result Value Ref Range   Color, UA     Clarity, UA     Glucose, UA Negative Negative   Bilirubin, UA     Ketones, UA neg    Spec Grav, UA     Blood, UA neg    pH, UA     POC,PROTEIN,UA Negative Negative, Trace, Small (1+), Moderate (2+), Large (3+), 4+   Urobilinogen, UA     Nitrite, UA neg  Leukocytes, UA Negative Negative   Appearance     Odor      Assessment & Plan:  High-risk pregnancy: G3P2002 at 33w3dwith an Estimated Date of Delivery: 07/09/22      ICD-10-CM   1. Supervision of high risk pregnancy in second trimester  O09.92     2. Gestational diabetes mellitus (GDM) controlled on oral hypoglycemic drug, antepartum  O24.415 POC Urinalysis Dipstick OB    3. Pelvic pressure in pregnancy  O26.899    R10.2    short interval and 3rd baby       Meds:  Meds ordered this encounter  Medications   NUVESSA 1.3 % GEL    Sig: Place 1 applicator vaginally once for 1 dose.    Dispense:  5 g    Refill:  1    Orders:  Orders Placed This Encounter  Procedures   POC Urinalysis Dipstick OB     Labs/procedures today: none  Treatment Plan:  Nuvessa for BV Prenatal cradle if needed    Follow-up: Return for keep scheduled.   Future Appointments  Date Time Provider DCoffey 03/28/2022 10:10 AM BRoma Schanz CNM [facility]    Orders Placed This Encounter  Procedures   POC Urinalysis Dipstick OB   LFlorian Buff Attending Physician for the Center for [facility] 03/22/2022 4:50 PM

## 2022-03-28 ENCOUNTER — Ambulatory Visit (INDEPENDENT_AMBULATORY_CARE_PROVIDER_SITE_OTHER): Payer: Self-pay | Admitting: Women's Health

## 2022-03-28 ENCOUNTER — Encounter: Payer: Self-pay | Admitting: Women's Health

## 2022-03-28 VITALS — BP 136/84 | HR 87 | Wt 272.0 lb

## 2022-03-28 DIAGNOSIS — O24415 Gestational diabetes mellitus in pregnancy, controlled by oral hypoglycemic drugs: Secondary | ICD-10-CM

## 2022-03-28 DIAGNOSIS — O0992 Supervision of high risk pregnancy, unspecified, second trimester: Secondary | ICD-10-CM

## 2022-03-28 DIAGNOSIS — O24112 Pre-existing diabetes mellitus, type 2, in pregnancy, second trimester: Secondary | ICD-10-CM

## 2022-03-28 DIAGNOSIS — Z3A25 25 weeks gestation of pregnancy: Secondary | ICD-10-CM

## 2022-03-28 LAB — POCT URINALYSIS DIPSTICK OB
Glucose, UA: NEGATIVE
Leukocytes, UA: NEGATIVE
Nitrite, UA: NEGATIVE
POC,PROTEIN,UA: NEGATIVE

## 2022-03-28 MED ORDER — METFORMIN HCL 1000 MG PO TABS: ORAL_TABLET | ORAL | 3 refills | Status: DC

## 2022-03-28 MED ORDER — GLYBURIDE 2.5 MG PO TABS: 2.5000 mg | ORAL_TABLET | Freq: Every day | ORAL | 3 refills | Status: DC

## 2022-03-28 NOTE — Patient Instructions (Signed)
Colleen Arnold, thank you for choosing our office today! We appreciate the opportunity to meet your healthcare needs. You may receive a short survey by mail, e-mail, or through [employer]. If you are happy with your care we would appreciate if you could take just a few minutes to complete the survey questions. We read all of your comments and take your feedback very seriously. Thank you again for choosing our office.  Center for [employer] Team at [place] at [facility] ([place], [place] [address]) Entrance C, located off of [place] parking   CLASSES: Go to [email or web address] to register for classes (childbirth, breastfeeding, waterbirth, infant CPR, daddy bootcamp, etc.)  Call the office [phone]) or go to [facility] if: You begin to have strong, frequent contractions Your water breaks.  Sometimes it is a big gush of fluid, sometimes it is just a trickle that keeps getting your panties wet or running down your legs You have vaginal bleeding.  It is normal to have a small amount of spotting if your cervix was checked.  You don't feel your baby moving like normal.  If you don't, get you something to eat and drink and lay down and focus on feeling your baby move.   If your baby is still not moving like normal, you should call the office or go to [facility]  Call the office [phone]) or go to [facility] hospital for these signs of pre-eclampsia: Severe headache that does not go away with Tylenol Visual changes- seeing spots, double, blurred vision Pain under your right breast or upper abdomen that does not go away with Tums or heartburn medicine Nausea and/or vomiting Severe swelling in your hands, feet, and face    [facility] Pediatricians/Family Doctors [facility]): [address] [place], [place]: [address] [place], [place] [facility]): [place], [phone] (call to ask if accepting patients) [facility] Department: [address], [place], [facility]): [address], [place]: [address] [place], [place] [facility] [place]: [place], [facility]): [phone] [facility]: [address], [place]: [address] [place] [facility]: [phone], [phone]  Home Blood Pressure Monitoring for Patients   Your provider has recommended that you check your blood pressure (BP) at least once a week at home. If you do not have a blood pressure cuff at home, one will be provided for you. Contact your provider if you have not received your monitor within 1 week.   Helpful Tips for Accurate Home Blood Pressure Checks  Don't smoke, exercise, or drink caffeine 30 minutes before checking your BP Use the restroom before checking your BP (a full bladder can raise your pressure) Relax in a comfortable upright chair Feet on the ground Left arm resting comfortably on a flat surface at the level of your heart Legs uncrossed Back supported Sit quietly and don't talk Place the cuff on your bare arm Adjust snuggly, so that only two  fingertips can fit between your skin and the top of the cuff Check 2 readings separated by at least one minute Keep a log of your BP readings For a visual, please reference this diagram: http://ccnc.care/bpdiagram  Provider Name: [facility]     Phone: [phone]  Zone 1: ALL CLEAR  Continue to monitor your symptoms:  BP reading is less than 140 (top number) or less than 90 (bottom number)  No right  upper stomach pain No headaches or seeing spots No feeling nauseated or throwing up No swelling in face and hands  Zone 2: CAUTION Call your doctor's office for any of the following:  BP reading is greater than 140 (top number) or greater than 90 (bottom number)  Stomach pain under your ribs in the middle or right side Headaches or seeing spots Feeling nauseated or throwing up Swelling in face and hands  Zone 3: EMERGENCY  Seek immediate medical care if you have any of the following:  BP reading is greater than160 (top number) or greater than 110 (bottom number) Severe headaches not improving with Tylenol Serious difficulty catching your breath Any worsening symptoms from Zone 2   Second Trimester of Pregnancy The second trimester is from week 13 through week 28, months 4 through 6. The second trimester is often a time when you feel your best. Your body has also adjusted to being pregnant, and you begin to feel better physically. Usually, morning sickness has lessened or quit completely, you may have more energy, and you may have an increase in appetite. The second trimester is also a time when the fetus is growing rapidly. At the end of the sixth month, the fetus is about 9 inches long and weighs about 1 pounds. You will likely begin to feel the baby move (quickening) between 18 and 20 weeks of the pregnancy. BODY CHANGES Your body goes through many changes during pregnancy. The changes vary from woman to woman.  Your weight will continue to increase. You will notice your lower abdomen bulging out. You may begin to get stretch marks on your hips, abdomen, and breasts. You may develop headaches that can be relieved by medicines approved by your health care provider. You may urinate more often because the fetus is pressing on your bladder. You may develop or continue to have heartburn as a result of your pregnancy. You may develop constipation because certain hormones are causing the  muscles that push waste through your intestines to slow down. You may develop hemorrhoids or swollen, bulging veins (varicose veins). You may have back pain because of the weight gain and pregnancy hormones relaxing your joints between the bones in your pelvis and as a result of a shift in weight and the muscles that support your balance. Your breasts will continue to grow and be tender. Your gums may bleed and may be sensitive to brushing and flossing. Dark spots or blotches (chloasma, mask of pregnancy) may develop on your face. This will likely fade after the baby is born. A dark line from your belly button to the pubic area (linea nigra) may appear. This will likely fade after the baby is born. You may have changes in your hair. These can include thickening of your hair, rapid growth, and changes in texture. Some women also have hair loss during or after pregnancy, or hair that feels dry or thin. Your hair will most likely return to normal after your baby is born. WHAT TO EXPECT AT YOUR PRENATAL VISITS During a  routine prenatal visit: You will be weighed to make sure you and the fetus are growing normally. Your blood pressure will be taken. Your abdomen will be measured to track your baby's growth. The fetal heartbeat will be listened to. Any test results from the previous visit will be discussed. Your health care provider may ask you: How you are feeling. If you are feeling the baby move. If you have had any abnormal symptoms, such as leaking fluid, bleeding, severe headaches, or abdominal cramping. If you have any questions. Other tests that may be performed during your second trimester include: Blood tests that check for: Low iron levels (anemia). Gestational diabetes (between 24 and 28 weeks). Rh antibodies. Urine tests to check for infections, diabetes, or protein in the urine. An ultrasound to confirm the proper growth and development of the baby. An amniocentesis to check for  possible genetic problems. Fetal screens for spina bifida and Down syndrome. HOME CARE INSTRUCTIONS  Avoid all smoking, herbs, alcohol, and unprescribed drugs. These chemicals affect the formation and growth of the baby. Follow your health care provider's instructions regarding medicine use. There are medicines that are either safe or unsafe to take during pregnancy. Exercise only as directed by your health care provider. Experiencing uterine cramps is a good sign to stop exercising. Continue to eat regular, healthy meals. Wear a good support bra for breast tenderness. Do not use hot tubs, steam rooms, or saunas. Wear your seat belt at all times when driving. Avoid raw meat, uncooked cheese, cat litter boxes, and soil used by cats. These carry germs that can cause birth defects in the baby. Take your prenatal vitamins. Try taking a stool softener (if your health care provider approves) if you develop constipation. Eat more high-fiber foods, such as fresh vegetables or fruit and whole grains. Drink plenty of fluids to keep your urine clear or pale yellow. Take warm sitz baths to soothe any pain or discomfort caused by hemorrhoids. Use hemorrhoid cream if your health care provider approves. If you develop varicose veins, wear support hose. Elevate your feet for 15 minutes, 3-4 times a day. Limit salt in your diet. Avoid heavy lifting, wear low heel shoes, and practice good posture. Rest with your legs elevated if you have leg cramps or low back pain. Visit your dentist if you have not gone yet during your pregnancy. Use a soft toothbrush to brush your teeth and be gentle when you floss. A sexual relationship may be continued unless your health care provider directs you otherwise. Continue to go to all your prenatal visits as directed by your health care provider. SEEK MEDICAL CARE IF:  You have dizziness. You have mild pelvic cramps, pelvic pressure, or nagging pain in the abdominal area. You  have persistent nausea, vomiting, or diarrhea. You have a bad smelling vaginal discharge. You have pain with urination. SEEK IMMEDIATE MEDICAL CARE IF:  You have a fever. You are leaking fluid from your vagina. You have spotting or bleeding from your vagina. You have severe abdominal cramping or pain. You have rapid weight gain or loss. You have shortness of breath with chest pain. You notice sudden or extreme swelling of your face, hands, ankles, feet, or legs. You have not felt your baby move in over an hour. You have severe headaches that do not go away with medicine. You have vision changes. Document Released: 09/27/2001 Document Revised: 10/08/2013 Document Reviewed: 12/04/2012 [facility] Patient Information 2015 [place], [place]. This information is not intended to replace advice  to you by your health care provider. Make sure you discuss any questions you have with your health care provider.  

## 2022-03-28 NOTE — Progress Notes (Signed)
HIGH-RISK PREGNANCY VISIT Patient name: Colleen Arnold MRN [id number]  Date of birth: Apr 03, 1986 Chief Complaint:   Routine Prenatal Visit  History of Present Illness:   Dorsie Sethi is a 36 y.o. 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    Today she reports  fbs 100-136, 2hr pp 86-140 . Trying to cut back on tortillas and rice. Drinks water and juice diluted w/ water. Does not want to go on insulin.  Contractions: Not present. Vag. Bleeding: None.  Movement: Present. denies leaking of fluid.      12/27/2021    3:45 PM 08/04/2020    8:36 AM 07/27/2020    9:29 AM 04/27/2020   10:35 AM  Depression screen PHQ 2/9  Decreased Interest 0 0 0 0  Down, Depressed, Hopeless 0 0 0 0  PHQ - 2 Score 0 0 0 0  Altered sleeping '1  1 1  '$ Tired, decreased energy '1  1 1  '$ Change in appetite 0  0 0  Feeling bad or failure about yourself  0  0 0  Trouble concentrating 0  0 0  Moving slowly or fidgety/restless 0  0 0  Suicidal thoughts 0  0 0  PHQ-9 Score '2  2 2        '$ 12/27/2021    3:45 PM 07/27/2020    9:29 AM 04/27/2020   10:35 AM  GAD 7 : Generalized Anxiety Score  Nervous, Anxious, on Edge 0 0 0  Control/stop worrying 0 1 0  Worry too much - different things 0 1 0  Trouble relaxing 0 0 0  Restless 0 0 0  Easily annoyed or irritable 0 0 0  Afraid - awful might happen 0 1 0  Total GAD 7 Score 0 3 0     Review of Systems:   Pertinent items are noted in HPI Denies abnormal vaginal discharge w/ itching/odor/irritation, headaches, visual changes, shortness of breath, chest pain, abdominal pain, severe nausea/vomiting, or problems with urination or bowel movements unless otherwise stated above. Pertinent History Reviewed:  Reviewed past medical,surgical, social, obstetrical and family history.  Reviewed problem list,  medications and allergies. Physical Assessment:   Vitals:   03/28/22 0929  BP: 136/84  Pulse: 87  Weight: 272 lb (123.4 kg)  Body mass index is 48.18 kg/m.           Physical Examination:   General appearance: alert, well appearing, and in no distress  Mental status: alert, oriented to person, place, and time  Skin: warm & dry   Extremities: Edema: None    Cardiovascular: normal heart rate noted  Respiratory: normal respiratory effort, no distress  Abdomen: gravid, soft, non-tender  Pelvic: Cervical exam deferred         Fetal Status: Fetal Heart Rate (bpm): 153 Fundal Height: 28 cm Movement: Present    Fetal Surveillance Testing today: doppler   Chaperone: N/A    Results for orders placed or performed in visit on 03/28/22 (from the past 24 hour(s))  POC Urinalysis Dipstick OB   Collection Time: 03/28/22  9:40 AM  Result Value Ref Range   Color, UA     Clarity, UA     Glucose, UA Negative Negative   Bilirubin, UA     Ketones, UA moderate    Spec Grav, UA     Blood, UA  trace    pH, UA     POC,PROTEIN,UA Negative Negative, Trace, Small (1+), Moderate (2+), Large (3+), 4+   Urobilinogen, UA     Nitrite, UA neg    Leukocytes, UA Negative Negative   Appearance     Odor      Assessment & Plan:  High-risk pregnancy: G3P2002 at 63w2dwith an Estimated Date of Delivery: 07/09/22   1) 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    Dispense:  30 tablet    Refill:  3    Order Specific Question:   Supervising Provider    Answer:   EElonda Husky LUTHER H [[id number]]   metFORMIN (GLUCOPHAGE) 1000 MG tablet    Sig: 1  tablet in the morning, 1 tablet at bedtime    Dispense:  60 tablet    Refill:  3    Order Specific Question:   Supervising Provider    Answer:   EFlorian Buff[[id number]]    Labs/procedures today: none  Treatment Plan:  AGrowth u/s q4wks       2x/wk testing or weekly BPP @ 32wks    Deliver @ 39wks:_____   Reviewed: Preterm labor symptoms and general obstetric precautions including but not limited to vaginal bleeding, contractions, leaking of fluid and fetal movement were reviewed in detail with the patient.  All questions were answered. Does have home bp cuff. Office bp cuff given: not applicable. Check bp weekly, let uKoreaknow if consistently >140 and/or >90.  Follow-up: Return in about 2 weeks (around 04/11/2022) for HROB, US:OB F/U heart, US:EFW, MD only, in person.   Future Appointments  Date Time Provider DKeokea 04/13/2022 10:30 AM [facility] [place] None  04/13/2022 11:30 AM OJanyth Pupa DO [facility]    Orders Placed This Encounter  Procedures   UKoreaOB Follow Up   POC Urinalysis Dipstick OB   KRoma SchanzCNM, [facility]03/28/2022 11:44 AM

## 2022-04-11 ENCOUNTER — Telehealth: Payer: Self-pay | Admitting: Registered"

## 2022-04-11 NOTE — Telephone Encounter (Signed)
Patient called to ask for more supplies for the Prodigy meter that was given to her in April. Pt states it is not easy to get to [facility] to get supplies and asked if she can get more than a month's supply when she finds a ride to [employer] in [place]. I told her to ask for a 78-month supply at the front desk.  Pt states her fasting blood sugar was elevated even with taking metformin. Pt states she did not want to take insulin so her MD prescribed glyburide. Pt states the additional medication has brought her fasting blood sugar into range.   Pt states she didn't want to take insulin because she is afraid of needles and because she thought it meant that she would have to continue to take insulin even after the baby is born. I explained the use of insulin and patient seemed to be more comfortable with the idea of starting insulin. Pt states she has an ultra sound appointment on the 28th and will discuss with doctor then.

## 2022-04-13 ENCOUNTER — Encounter: Payer: Self-pay | Admitting: Obstetrics & Gynecology

## 2022-04-13 ENCOUNTER — Ambulatory Visit (INDEPENDENT_AMBULATORY_CARE_PROVIDER_SITE_OTHER): Payer: Self-pay

## 2022-04-13 ENCOUNTER — Ambulatory Visit (INDEPENDENT_AMBULATORY_CARE_PROVIDER_SITE_OTHER): Payer: Self-pay | Admitting: Obstetrics & Gynecology

## 2022-04-13 VITALS — BP 133/81 | HR 83 | Wt 268.4 lb

## 2022-04-13 DIAGNOSIS — O0992 Supervision of high risk pregnancy, unspecified, second trimester: Secondary | ICD-10-CM

## 2022-04-13 DIAGNOSIS — O24415 Gestational diabetes mellitus in pregnancy, controlled by oral hypoglycemic drugs: Secondary | ICD-10-CM

## 2022-04-13 DIAGNOSIS — O409XX0 Polyhydramnios, unspecified trimester, not applicable or unspecified: Secondary | ICD-10-CM

## 2022-04-13 DIAGNOSIS — Z3A27 27 weeks gestation of pregnancy: Secondary | ICD-10-CM

## 2022-04-13 LAB — POCT URINALYSIS DIPSTICK OB
Blood, UA: NEGATIVE
Glucose, UA: NEGATIVE
Ketones, UA: NEGATIVE
Leukocytes, UA: NEGATIVE
Nitrite, UA: NEGATIVE
POC,PROTEIN,UA: NEGATIVE

## 2022-04-13 NOTE — Progress Notes (Signed)
HIGH-RISK PREGNANCY VISIT Patient name: Colleen Arnold MRN [id number]  Date of birth: 06-25-1986 Chief Complaint:   Routine Prenatal Visit  History of Present Illness:   Colleen Arnold is a 36 y.o. G76P2002 female at 59w4dwith an Estimated Date of Delivery: 07/09/22 being seen today for ongoing management of a high-risk pregnancy complicated by: -Class B DM Currently on metformin '1000mg'$  bid and glyburide 2.'5mg'$  qhs Sugars improved though still slight elevation in fasting.    Today she reports no complaints.   Contractions: Not present. Vag. Bleeding: None.  Movement: Present. denies leaking of fluid.      12/27/2021    3:45 PM 08/04/2020    8:36 AM 07/27/2020    9:29 AM 04/27/2020   10:35 AM  Depression screen PHQ 2/9  Decreased Interest 0 0 0 0  Down, Depressed, Hopeless 0 0 0 0  PHQ - 2 Score 0 0 0 0  Altered sleeping '1  1 1  '$ Tired, decreased energy '1  1 1  '$ Change in appetite 0  0 0  Feeling bad or failure about yourself  0  0 0  Trouble concentrating 0  0 0  Moving slowly or fidgety/restless 0  0 0  Suicidal thoughts 0  0 0  PHQ-9 Score '2  2 2     '$ Current Outpatient Medications  Medication Instructions   Accu-Chek Softclix Lancets lancets Check blood sugar four times a day.   acetaminophen (TYLENOL) 1,000 mg, As needed   aspirin EC 162 mg, Oral, Daily, Swallow whole.   glucose blood (ACCU-CHEK GUIDE) test strip Check blood sugar four times a day.   glyBURIDE (DIABETA) 2.5 mg, Oral, Daily with supper   metFORMIN (GLUCOPHAGE) 1000 MG tablet 1 tablet in the morning, 1 tablet at bedtime   pantoprazole (PROTONIX) 20 mg, Oral, Daily   prenatal vitamin w/FE, FA (PRENATAL 1 + 1) 27-1 MG TABS tablet 1 tablet, Oral, Daily     Review of Systems:   Pertinent items are noted in HPI Denies abnormal vaginal discharge w/ itching/odor/irritation, headaches, visual changes, shortness of breath, chest pain, abdominal pain, severe nausea/vomiting, or problems with urination or bowel  movements unless otherwise stated above. Pertinent History Reviewed:  Reviewed past medical,surgical, social, obstetrical and family history.  Reviewed problem list, medications and allergies. Physical Assessment:   Vitals:   04/13/22 1135  BP: 133/81  Pulse: 83  Weight: 268 lb 6.4 oz (121.7 kg)  Body mass index is 47.54 kg/m.           Physical Examination:   General appearance: alert, well appearing, and in no distress  Mental status: normal mood, behavior, speech, dress, motor activity, and thought processes  Skin: warm & dry   Extremities: Edema: None    Cardiovascular: normal heart rate noted  Respiratory: normal respiratory effort, no distress  Abdomen: gravid, soft, non-tender  Pelvic: Cervical exam deferred         Fetal Status:     Movement: Present    Fetal Surveillance Testing today: [place]219+1wks,cephalic,cx 4.1 cm,posterior placenta gr 0,polyhydramnios,AFI 25.7 cm,FHR 162 bpm,EFW 1368 g 93%,AC 98%,anatomy of the heart complete,normal stomach   Chaperone: N/A    Results for orders placed or performed in visit on 04/13/22 (from the past 24 hour(s))  POC Urinalysis Dipstick OB   Collection Time: 04/13/22 11:42 AM  Result Value Ref Range   Color, UA     Clarity, UA     Glucose, UA Negative Negative   Bilirubin,  UA     Ketones, UA neg    Spec Grav, UA     Blood, UA neg    pH, UA     POC,PROTEIN,UA Negative Negative, Trace, Small (1+), Moderate (2+), Large (3+), 4+   Urobilinogen, UA     Nitrite, UA neg    Leukocytes, UA Negative Negative   Appearance     Odor       Assessment & Plan:  High-risk pregnancy: G3P2002 at 74w4dwith an Estimated Date of Delivery: 07/09/22   1) Class B DM -last couple of days fastings have improved, will not change medication this visit '[]'$  consider glyburide '5mg'$  next visit if fastings still elevated  -continue growth q 4wk  2) Polyhydramnios -reviewed current AFI -if AFI 30 or higher, plan for BPP weekly (already  scheduled/will cancel if not indicated)  3) Prior C-section -plan for repeat and right salpingectomy  Meds: No orders of the defined types were placed in this encounter.   Labs/procedures today: growth scan  Treatment Plan:  as outlined above  Reviewed: Preterm labor symptoms and general obstetric precautions including but not limited to vaginal bleeding, contractions, leaking of fluid and fetal movement were reviewed in detail with the patient.  All questions were answered. Pt has home bp cuff. Check bp weekly, let uKoreaknow if >140/90.   Follow-up: Return in about 2 weeks (around 04/27/2022) [place], 3-4wk growth then weekly BPP.   Future Appointments  Date Time Provider DAmboy 04/28/2022 11:50 AM EFlorian Buff MD [facility] FTOBGYN  05/12/2022  3:00 PM [facility] - FTOBGYN [place] None  05/12/2022  3:50 PM OJanyth Pupa DO [facility]TO[facility]  05/19/2022  1:30 PM [facility] - FTOBGYN [place] None  05/26/2022  2:15 PM [facility] - FTOBGYN [place] None  05/26/2022  3:30 PM OJanyth Pupa DO [facility]TO[facility]  06/02/2022 10:30 AM [facility] - FTOBGYN [place] None  06/02/2022 11:50 AM CChristin Fudge CNM [facility]TO[facility]  06/09/2022  1:30 PM [place]- FTOBGYN [place]-FTIMG None  06/09/2022  2:30 PM OJanyth Pupa DO [facility]TO[facility]  06/16/2022  2:30 PM OJanyth Pupa DO [facility]  06/23/2022  2:30 PM OJanyth Pupa DO [facility]TO[facility]    Orders Placed This Encounter  Procedures   POC Urinalysis Dipstick OB    JJanyth Pupa DO Attending OAccokeek FRussellfor WDean Foods Company CBaldwinGroup

## 2022-04-13 NOTE — Progress Notes (Signed)
[place] 83+6 wks,cephalic,cx 4.1 cm,posterior placenta gr 0,polyhydramnios,AFI 25.7 cm,FHR 162 bpm,EFW 1368 g 93%,AC 98%,anatomy of the heart complete,normal stomach

## 2022-04-28 ENCOUNTER — Ambulatory Visit (INDEPENDENT_AMBULATORY_CARE_PROVIDER_SITE_OTHER): Payer: Self-pay | Admitting: Obstetrics & Gynecology

## 2022-04-28 ENCOUNTER — Encounter: Payer: Self-pay | Admitting: Obstetrics & Gynecology

## 2022-04-28 VITALS — BP 134/76 | HR 92 | Wt 272.0 lb

## 2022-04-28 DIAGNOSIS — O0990 Supervision of high risk pregnancy, unspecified, unspecified trimester: Secondary | ICD-10-CM

## 2022-04-28 DIAGNOSIS — Z3A29 29 weeks gestation of pregnancy: Secondary | ICD-10-CM

## 2022-04-28 DIAGNOSIS — O24319 Unspecified pre-existing diabetes mellitus in pregnancy, unspecified trimester: Secondary | ICD-10-CM

## 2022-04-28 LAB — POCT URINALYSIS DIPSTICK OB
Blood, UA: NEGATIVE
Glucose, UA: NEGATIVE
Leukocytes, UA: NEGATIVE
Nitrite, UA: NEGATIVE
POC,PROTEIN,UA: NEGATIVE

## 2022-04-28 MED ORDER — GLYBURIDE 5 MG PO TABS: 5.0000 mg | ORAL_TABLET | Freq: Every day | ORAL | 1 refills | Status: DC

## 2022-04-28 NOTE — Progress Notes (Signed)
HIGH-RISK PREGNANCY VISIT Patient name: Colleen Arnold MRN [id number]  Date of birth: 1986-01-12 Chief Complaint:   Routine Prenatal Visit  History of Present Illness:   Colleen Arnold is a 36 y.o. G48P2002 female at 109w5dwith an Estimated Date of Delivery: 07/09/22 being seen today for ongoing management of a high-risk pregnancy complicated by AW4/Odiabetes.    Today she reports no complaints. Contractions: Not present. Vag. Bleeding: None.  Movement: Present. denies leaking of fluid.      12/27/2021    3:45 PM 08/04/2020    8:36 AM 07/27/2020    9:29 AM 04/27/2020   10:35 AM  Depression screen PHQ 2/9  Decreased Interest 0 0 0 0  Down, Depressed, Hopeless 0 0 0 0  PHQ - 2 Score 0 0 0 0  Altered sleeping '1  1 1  '$ Tired, decreased energy '1  1 1  '$ Change in appetite 0  0 0  Feeling bad or failure about yourself  0  0 0  Trouble concentrating 0  0 0  Moving slowly or fidgety/restless 0  0 0  Suicidal thoughts 0  0 0  PHQ-9 Score '2  2 2        '$ 12/27/2021    3:45 PM 07/27/2020    9:29 AM 04/27/2020   10:35 AM  GAD 7 : Generalized Anxiety Score  Nervous, Anxious, on Edge 0 0 0  Control/stop worrying 0 1 0  Worry too much - different things 0 1 0  Trouble relaxing 0 0 0  Restless 0 0 0  Easily annoyed or irritable 0 0 0  Afraid - awful might happen 0 1 0  Total GAD 7 Score 0 3 0     Review of Systems:   Pertinent items are noted in HPI Denies abnormal vaginal discharge w/ itching/odor/irritation, headaches, visual changes, shortness of breath, chest pain, abdominal pain, severe nausea/vomiting, or problems with urination or bowel movements unless otherwise stated above. Pertinent History Reviewed:  Reviewed past medical,surgical, social, obstetrical and family history.  Reviewed problem list, medications and allergies. Physical Assessment:   Vitals:   04/28/22 1153  BP: 134/76  Pulse: 92  Weight: 272 lb (123.4 kg)  Body mass index is 48.18 kg/m.           Physical  Examination:   General appearance: alert, well appearing, and in no distress  Mental status: alert, oriented to person, place, and time  Skin: warm & dry   Extremities: Edema: None    Cardiovascular: normal heart rate noted  Respiratory: normal respiratory effort, no distress  Abdomen: gravid, soft, non-tender  Pelvic: Cervical exam deferred         Fetal Status:     Movement: Present    Fetal Surveillance Testing today: FHR 150   Chaperone: N/A    Results for orders placed or performed in visit on 04/28/22 (from the past 24 hour(s))  POC Urinalysis Dipstick OB   Collection Time: 04/28/22 11:55 AM  Result Value Ref Range   Color, UA     Clarity, UA     Glucose, UA Negative Negative   Bilirubin, UA     Ketones, UA small    Spec Grav, UA     Blood, UA neg    pH, UA     POC,PROTEIN,UA Negative Negative, Trace, Small (1+), Moderate (2+), Large (3+), 4+   Urobilinogen, UA     Nitrite, UA neg    Leukocytes, UA Negative Negative  Appearance     Odor      Assessment & Plan:  High-risk pregnancy: [id number] at 75w5dwith an Estimated Date of Delivery: 07/09/22      ICD-10-CM   1. Supervision of high risk pregnancy, antepartum  O09.90 POC Urinalysis Dipstick OB    2. [redacted] weeks gestation of pregnancy  Z3A.29 POC Urinalysis Dipstick OB    3. Class B DM  O24.319    metformin 1000 mg + glyburide ^5 mg        Meds:  Meds ordered this encounter  Medications   glyBURIDE (DIABETA) 5 MG tablet    Sig: Take 1 tablet (5 mg total) by mouth daily with supper.    Dispense:  30 tablet    Refill:  1    Orders:  Orders Placed This Encounter  Procedures   POC Urinalysis Dipstick OB     Labs/procedures today: none  Treatment Plan:  begin testing 32 weeks    Follow-up: Return for keep scheduled.   Future Appointments  Date Time Provider DHaworth 05/12/2022  3:00 PM [facility] - FTOBGYN [place]-FTIMG None  05/12/2022  3:50 PM OJanyth Pupa DO [facility] FTOBGYN  05/19/2022   1:30 PM [facility] - FTOBGYN [place]IMG None  05/26/2022  2:15 PM CL[place]- FTOBGYN [place]-FTIMG None  05/26/2022  3:30 PM OJanyth Pupa DO [facility] FTO[facility]YN  06/02/2022 10:30 AM [facility] - FTOBGYN [place]-FTIMG None  06/02/2022 11:50 AM Cresenzo-Dishmon, FJoaquim Lai CNM [facility] FTO[facility]YN  06/09/2022  1:30 PM C[place]- FTOBGYN [place]CWH-FTIMG None  06/09/2022  2:30 PM OJanyth Pupa DO [facility] FTO[facility]  06/16/2022  1:30 PM [place]- FTOBGYN [place]WH-FTIMG None  06/16/2022  2:30 PM OJanyth Pupa DO [facility] FTO[facility]  06/23/2022  1:30 PM [place]- FTOBGYN [place]-FTIMG None  06/23/2022  2:30 PM OJanyth Pupa DO [facility] FTO[facility]  06/30/2022  1:30 PM CH[place]- FTOBGYN [place]CWH-FTIMG None  06/30/2022  2:30 PM OJanyth Pupa DO [facility] FTO[facility]  07/07/2022  1:30 PM [place]- FTOBGYN [place]WH-FTIMG None  07/07/2022  2:30 PM Levent Kornegay, LMertie Clause MD [facility] FTOBGYN    Orders Placed This Encounter  Procedures   POC Urinalysis Dipstick OB   LFlorian Buff Attending Physician for the Center for WMound CityGroup 04/28/2022 12:27 PM

## 2022-05-11 ENCOUNTER — Other Ambulatory Visit: Payer: Self-pay | Admitting: Obstetrics & Gynecology

## 2022-05-11 DIAGNOSIS — O24419 Gestational diabetes mellitus in pregnancy, unspecified control: Secondary | ICD-10-CM

## 2022-05-11 DIAGNOSIS — O409XX0 Polyhydramnios, unspecified trimester, not applicable or unspecified: Secondary | ICD-10-CM

## 2022-05-12 ENCOUNTER — Encounter: Payer: Self-pay | Admitting: Obstetrics & Gynecology

## 2022-05-12 ENCOUNTER — Ambulatory Visit (INDEPENDENT_AMBULATORY_CARE_PROVIDER_SITE_OTHER): Payer: Self-pay

## 2022-05-12 ENCOUNTER — Ambulatory Visit (INDEPENDENT_AMBULATORY_CARE_PROVIDER_SITE_OTHER): Payer: Self-pay | Admitting: Obstetrics & Gynecology

## 2022-05-12 VITALS — BP 135/81 | HR 89 | Wt 269.4 lb

## 2022-05-12 DIAGNOSIS — O24919 Unspecified diabetes mellitus in pregnancy, unspecified trimester: Secondary | ICD-10-CM

## 2022-05-12 DIAGNOSIS — O0993 Supervision of high risk pregnancy, unspecified, third trimester: Secondary | ICD-10-CM

## 2022-05-12 DIAGNOSIS — O163 Unspecified maternal hypertension, third trimester: Secondary | ICD-10-CM

## 2022-05-12 DIAGNOSIS — O24415 Gestational diabetes mellitus in pregnancy, controlled by oral hypoglycemic drugs: Secondary | ICD-10-CM

## 2022-05-12 DIAGNOSIS — O409XX0 Polyhydramnios, unspecified trimester, not applicable or unspecified: Secondary | ICD-10-CM

## 2022-05-12 DIAGNOSIS — Z98891 History of uterine scar from previous surgery: Secondary | ICD-10-CM

## 2022-05-12 DIAGNOSIS — Z794 Long term (current) use of insulin: Secondary | ICD-10-CM

## 2022-05-12 DIAGNOSIS — O24419 Gestational diabetes mellitus in pregnancy, unspecified control: Secondary | ICD-10-CM

## 2022-05-12 DIAGNOSIS — Z3A31 31 weeks gestation of pregnancy: Secondary | ICD-10-CM

## 2022-05-12 LAB — POCT URINALYSIS DIPSTICK OB
Blood, UA: NEGATIVE
Glucose, UA: NEGATIVE
Ketones, UA: NEGATIVE
Leukocytes, UA: NEGATIVE
Nitrite, UA: NEGATIVE
POC,PROTEIN,UA: NEGATIVE

## 2022-05-12 MED ORDER — INSULIN GLARGINE 100 UNIT/ML ~~LOC~~ SOLN: 2.0000 [IU] | Freq: Every day | SUBCUTANEOUS | 11 refills | Status: DC

## 2022-05-12 NOTE — Progress Notes (Signed)
HIGH-RISK PREGNANCY VISIT Patient name: Colleen Arnold MRN [id number]  Date of birth: 1986-08-06 Chief Complaint:   Routine Prenatal Visit (Headaches since Monday, seeing spots. Took Tylenol with relief, took a nap but woke up with headache again)  History of Present Illness:   Colleen Arnold is a 36 y.o. G55P2002 female at 85w5dwith an Estimated Date of Delivery: 07/09/22 being seen today for ongoing management of a high-risk pregnancy complicated by:  GUKGU5/KYHCWB DM.   Sugars reviewed majority of fastings elevated ~100  Today she reports occasional cramps  Contractions: Not present. Vag. Bleeding: None.  Movement: Present. denies leaking of fluid.   Spanish interpreter present     12/27/2021    3:45 PM 08/04/2020    8:36 AM 07/27/2020    9:29 AM 04/27/2020   10:35 AM  Depression screen PHQ 2/9  Decreased Interest 0 0 0 0  Down, Depressed, Hopeless 0 0 0 0  PHQ - 2 Score 0 0 0 0  Altered sleeping '1  1 1  '$ Tired, decreased energy '1  1 1  '$ Change in appetite 0  0 0  Feeling bad or failure about yourself  0  0 0  Trouble concentrating 0  0 0  Moving slowly or fidgety/restless 0  0 0  Suicidal thoughts 0  0 0  PHQ-9 Score '2  2 2     '$ Current Outpatient Medications  Medication Instructions   Accu-Chek Softclix Lancets lancets Check blood sugar four times a day.   acetaminophen (TYLENOL) 1,000 mg, Oral, As needed   aspirin EC 162 mg, Oral, Daily, Swallow whole.   glucose blood (ACCU-CHEK GUIDE) test strip Check blood sugar four times a day.   glyBURIDE (DIABETA) 5 mg, Oral, Daily with supper   insulin glargine (SEMGLEE) 2 Units, Subcutaneous, Daily at bedtime   metFORMIN (GLUCOPHAGE) 1000 MG tablet 1 tablet in the morning, 1 tablet at bedtime   pantoprazole (PROTONIX) 20 mg, Oral, Daily   prenatal vitamin w/FE, FA (PRENATAL 1 + 1) 27-1 MG TABS tablet 1 tablet, Oral, Daily     Review of Systems:   Pertinent items are noted in HPI Denies abnormal vaginal discharge w/  itching/odor/irritation, headaches, visual changes, shortness of breath, chest pain, abdominal pain, severe nausea/vomiting, or problems with urination or bowel movements unless otherwise stated above. Pertinent History Reviewed:  Reviewed past medical,surgical, social, obstetrical and family history.  Reviewed problem list, medications and allergies. Physical Assessment:   Vitals:   05/12/22 1545  BP: 135/81  Pulse: 89  Weight: 269 lb 6.4 oz (122.2 kg)  Body mass index is 47.72 kg/m.           Physical Examination:   General appearance: alert, well appearing, and in no distress  Mental status: normal mood, behavior, speech, dress, motor activity, and thought processes  Skin: warm & dry   Extremities: Edema: None    Cardiovascular: normal heart rate noted  Respiratory: normal respiratory effort, no distress  Abdomen: gravid, soft, non-tender  Pelvic: Cervical exam deferred         Fetal Status:     Movement: Present    Fetal Surveillance Testing today: cephalic,BPP 82/3,JSEGBTDVVOHYWV,PXT31.6 cm,FHR 157 bpm,posterior placenta gr 0,EFW 2518 g 99%   Chaperone: N/A    Results for orders placed or performed in visit on 05/12/22 (from the past 24 hour(s))  POC Urinalysis Dipstick OB   Collection Time: 05/12/22  3:49 PM  Result Value Ref Range   Color, UA  Clarity, UA     Glucose, UA Negative Negative   Bilirubin, UA     Ketones, UA neg    Spec Grav, UA     Blood, UA neg    pH, UA     POC,PROTEIN,UA Negative Negative, Trace, Small (1+), Moderate (2+), Large (3+), 4+   Urobilinogen, UA     Nitrite, UA neg    Leukocytes, UA Negative Negative   Appearance     Odor       Assessment & Plan:  High-risk pregnancy: G3P2002 at 60w5dwith an Estimated Date of Delivery: 07/09/22   1) Class B DM/GDMA2 -long discussion with patient regarding her diet and medication -discussed that insulin would be a better option than glyburide.  Additionally based on AFI and growth suggestive  of uncontrolled sugars -offered dietician she declined, reviewed ways to track carbs -continue growth q 4wks and weekly BPP  -plan to start Semglee 2units at night- ok to increase to 4units if stops glyburide  -F/u in 1 week  2) Polyhydramnios -discussed risks, plan to monitor for contractions/preterm labor -discussed amniocentesis but only in certain settings  3) Prior C-section x 2 with BTL  Bilateral tubal ligation reviewed with R&B including but not limited to bleeding, infection, injury to other organs, irreversibility and failure rate of 10/998. Questions answered and desires to proceed   4) Borderline BPs -pt encouraged to purchase home BP cuff and check every couple of days   Meds:  Meds ordered this encounter  Medications   insulin glargine (SEMGLEE) 100 UNIT/ML injection    Sig: Inject 0.02 mLs (2 Units total) into the skin at bedtime.    Dispense:  10 mL    Refill:  11    Labs/procedures today: growth  Treatment Plan:  as outlined above  Reviewed: Preterm labor symptoms and general obstetric precautions including but not limited to vaginal bleeding, contractions, leaking of fluid and fetal movement were reviewed in detail with the patient.  All questions were answered.   Follow-up: Return in about 1 week (around 05/19/2022) for HROB visit-8/3 and continue weekly as scheduled.   Future Appointments  Date Time Provider DAssaria 05/19/2022  1:30 PM [facility] - FTOBGYN [place]-FTIMG None  05/19/2022  3:10 PM EFlorian Buff MD [facility] FTOBGYN  05/26/2022  2:15 PM CH[place]- FTOBGYN [place]CWH-FTIMG None  05/26/2022  3:30 PM OJanyth Pupa DO [facility] FTOBGYN  06/02/2022 10:00 AM [facility] - FTOBGYN [place]-FTIMG None  06/02/2022 10:50 AM CChristin Fudge CNM [facility] FTOBGYN  06/09/2022  1:30 PM [place]- FTOBGYN [place]-FTIMG None  06/09/2022  2:30 PM OJanyth Pupa DO [facility] FTOBGYN  06/16/2022  1:30 PM [place]- FTOBGYN [place]WH-FTIMG None  06/16/2022  2:30 PM OJanyth Pupa DO [facility]  FTOBGYN  06/23/2022  1:30 PM CT[place]- FTOBGYN [place]WH-FTIMG None  06/23/2022  2:30 PM OJanyth Pupa DO [facility] FTOBGYN  06/30/2022  1:30 PM [facility] - FTOBGYN [place]-FTIMG None  06/30/2022  2:30 PM OJanyth Pupa DO [facility] FTOBGYN  07/07/2022  1:30 PM [place]- FTOBGYN [place]CWH-FTIMG None  07/07/2022  2:30 PM Eure, LMertie Clause MD [facility] FTOBGYN    Orders Placed This Encounter  Procedures   POC Urinalysis Dipstick OB    JJanyth Pupa DO Attending OFrankfort FPalmetto Surgery Center LLCfor WDean Foods Company CGreen BayGroup

## 2022-05-12 NOTE — Progress Notes (Signed)
[place] 75+3 wks,cephalic,BPP 0/1,UAUEBVPLWUZRVU,FCZ 31.6 cm,FHR 157 bpm,posterior placenta gr 0,EFW 2518 g 99%

## 2022-05-13 ENCOUNTER — Telehealth: Payer: Self-pay | Admitting: Adult Health

## 2022-05-13 ENCOUNTER — Other Ambulatory Visit: Payer: Self-pay | Admitting: Obstetrics & Gynecology

## 2022-05-13 DIAGNOSIS — O24919 Unspecified diabetes mellitus in pregnancy, unspecified trimester: Secondary | ICD-10-CM

## 2022-05-13 MED ORDER — INSULIN NPH (HUMAN) (ISOPHANE) 100 UNIT/ML ~~LOC~~ SUSP: 2.0000 [IU] | Freq: Every day | SUBCUTANEOUS | 3 refills | Status: DC

## 2022-05-13 MED ORDER — "INSULIN SYRINGE 30G X 1/2"" 0.5 ML MISC": 1.0000 [IU] | Freq: Every evening | 11 refills | Status: DC

## 2022-05-13 NOTE — Progress Notes (Signed)
Due to cost of semglee changed to novolin N- Rx sent in

## 2022-05-13 NOTE — Telephone Encounter (Signed)
Pt is asking if a cheaper insulin could be called into her pharmacy. Please advise.

## 2022-05-13 NOTE — Telephone Encounter (Signed)
Pt aware that a cheaper insulin has been sent in. She will more than likely be on this until she delivers. Pt advised can purchase a BP monitor or can check her BP @ [facility] a few times a week. If she gets high readings, please let us know. Pt voiced understanding. [place]

## 2022-05-18 ENCOUNTER — Other Ambulatory Visit: Payer: Self-pay | Admitting: Obstetrics & Gynecology

## 2022-05-18 DIAGNOSIS — O409XX0 Polyhydramnios, unspecified trimester, not applicable or unspecified: Secondary | ICD-10-CM

## 2022-05-18 DIAGNOSIS — O24419 Gestational diabetes mellitus in pregnancy, unspecified control: Secondary | ICD-10-CM

## 2022-05-19 ENCOUNTER — Ambulatory Visit (INDEPENDENT_AMBULATORY_CARE_PROVIDER_SITE_OTHER): Payer: Self-pay | Admitting: Obstetrics & Gynecology

## 2022-05-19 ENCOUNTER — Ambulatory Visit (INDEPENDENT_AMBULATORY_CARE_PROVIDER_SITE_OTHER): Payer: Self-pay

## 2022-05-19 ENCOUNTER — Encounter: Payer: Self-pay | Admitting: Obstetrics & Gynecology

## 2022-05-19 VITALS — BP 120/75 | HR 86 | Wt 268.0 lb

## 2022-05-19 DIAGNOSIS — Z794 Long term (current) use of insulin: Secondary | ICD-10-CM

## 2022-05-19 DIAGNOSIS — Z3A32 32 weeks gestation of pregnancy: Secondary | ICD-10-CM

## 2022-05-19 DIAGNOSIS — O24319 Unspecified pre-existing diabetes mellitus in pregnancy, unspecified trimester: Secondary | ICD-10-CM

## 2022-05-19 DIAGNOSIS — O409XX0 Polyhydramnios, unspecified trimester, not applicable or unspecified: Secondary | ICD-10-CM

## 2022-05-19 DIAGNOSIS — O24919 Unspecified diabetes mellitus in pregnancy, unspecified trimester: Secondary | ICD-10-CM

## 2022-05-19 DIAGNOSIS — O24419 Gestational diabetes mellitus in pregnancy, unspecified control: Secondary | ICD-10-CM

## 2022-05-19 DIAGNOSIS — O0993 Supervision of high risk pregnancy, unspecified, third trimester: Secondary | ICD-10-CM

## 2022-05-19 LAB — POCT URINALYSIS DIPSTICK OB
Bilirubin, UA: NEGATIVE
Blood, UA: NEGATIVE
Glucose, UA: NEGATIVE
Ketones, UA: NEGATIVE
Leukocytes, UA: NEGATIVE
Nitrite, UA: NEGATIVE
POC,PROTEIN,UA: NEGATIVE
Spec Grav, UA: 1.01 (ref 1.010–1.025)
Urobilinogen, UA: 0.2 E.U./dL
pH, UA: 5.5 (ref 5.0–8.0)

## 2022-05-19 MED ORDER — INSULIN NPH (HUMAN) (ISOPHANE) 100 UNIT/ML ~~LOC~~ SUSP: 10.0000 [IU] | Freq: Every day | SUBCUTANEOUS | 3 refills | Status: DC

## 2022-05-19 NOTE — Progress Notes (Signed)
[place] 81+5 wks,cephalic,BPP 9/4,LMR 615 bpm,posterior placenta gr 1,polyhydramnios 30 cm

## 2022-05-19 NOTE — Progress Notes (Signed)
HIGH-RISK PREGNANCY VISIT Patient name: Colleen Arnold MRN [id number]  Date of birth: [birth year] Chief Complaint:   Routine Prenatal Visit  History of Present Illness:   Colleen Arnold is a 36 y.o. G43P2002 female at 49w5dwith an Estimated Date of Delivery: 07/09/22 being seen today for ongoing management of a high-risk pregnancy complicated by Class AU7/ODM on metformin, .    Today she reports no complaints. Contractions: Not present. Vag. Bleeding: None.  Movement: Present. denies leaking of fluid.      12/27/2021    3:45 PM 08/04/2020    8:36 AM 07/27/2020    9:29 AM 04/27/2020   10:35 AM  Depression screen PHQ 2/9  Decreased Interest 0 0 0 0  Down, Depressed, Hopeless 0 0 0 0  PHQ - 2 Score 0 0 0 0  Altered sleeping '1  1 1  '$ Tired, decreased energy '1  1 1  '$ Change in appetite 0  0 0  Feeling bad or failure about yourself  0  0 0  Trouble concentrating 0  0 0  Moving slowly or fidgety/restless 0  0 0  Suicidal thoughts 0  0 0  PHQ-9 Score '2  2 2        '$ 12/27/2021    3:45 PM 07/27/2020    9:29 AM 04/27/2020   10:35 AM  GAD 7 : Generalized Anxiety Score  Nervous, Anxious, on Edge 0 0 0  Control/stop worrying 0 1 0  Worry too much - different things 0 1 0  Trouble relaxing 0 0 0  Restless 0 0 0  Easily annoyed or irritable 0 0 0  Afraid - awful might happen 0 1 0  Total GAD 7 Score 0 3 0     Review of Systems:   Pertinent items are noted in HPI Denies abnormal vaginal discharge w/ itching/odor/irritation, headaches, visual changes, shortness of breath, chest pain, abdominal pain, severe nausea/vomiting, or problems with urination or bowel movements unless otherwise stated above. Pertinent History Reviewed:  Reviewed past medical,surgical, social, obstetrical and family history.  Reviewed problem list, medications and allergies. Physical Assessment:   Vitals:   05/19/22 1529  BP: 120/75  Pulse: 86  Weight: 268 lb (121.6 kg)  Body mass index is 47.47 kg/m.            Physical Examination:   General appearance: alert, well appearing, and in no distress  Mental status: alert, oriented to person, place, and time  Skin: warm & dry   Extremities: Edema: Trace    Cardiovascular: normal heart rate noted  Respiratory: normal respiratory effort, no distress  Abdomen: gravid, soft, non-tender  Pelvic: Cervical exam deferred         Fetal Status:     Movement: Present    Fetal Surveillance Testing today: BPP 8/8   Chaperone: N/A    Results for orders placed or performed in visit on 05/19/22 (from the past 24 hour(s))  POC Urinalysis Dipstick OB   Collection Time: 05/19/22  3:47 PM  Result Value Ref Range   Color, UA amber    Clarity, UA clear    Glucose, UA Negative Negative   Bilirubin, UA neg    Ketones, UA neg    Spec Grav, UA 1.010 1.010 - 1.025   Blood, UA neg    pH, UA 5.5 5.0 - 8.0   POC,PROTEIN,UA Negative Negative, Trace, Small (1+), Moderate (2+), Large (3+), 4+   Urobilinogen, UA 0.2 0.2 or 1.0 E.U./dL  Nitrite, UA neg    Leukocytes, UA Negative Negative   Appearance dark    Odor      Assessment & Plan:  High-risk pregnancy: G3P2002 at 47w5dwith an Estimated Date of Delivery: 07/09/22      ICD-10-CM   1. Supervision of high risk pregnancy in third trimester  O09.93 POC Urinalysis Dipstick OB    2. Diabetes mellitus during pregnancy treated with insulin (HCC)  O24.919 insulin NPH Human (NOVOLIN N) 100 UNIT/ML injection   Z79.4     3. Class B DM  O24.319         Meds:  Meds ordered this encounter  Medications   insulin NPH Human (NOVOLIN N) 100 UNIT/ML injection    Sig: Inject 0.1 mLs (10 Units total) into the skin at bedtime.    Dispense:  10 mL    Refill:  3    Orders:  Orders Placed This Encounter  Procedures   POC Urinalysis Dipstick OB     Labs/procedures today: U/S  Treatment Plan:  twice weekly surveillance, repeat section around 07/02/22   Follow-up: No follow-ups on file.   Future Appointments   Date Time Provider DWibaux 05/26/2022  2:15 PM [facility] [place] None  05/26/2022  3:30 PM OJanyth Pupa DO [facility] FTOBGYN  06/02/2022 10:00 AM [facility] - FTOBGYN [place]-FTIMG None  06/02/2022 10:50 AM CChristin Fudge CNM [facility] FTOBGYN  06/09/2022  1:30 PM CB[place]- FTOBGYN [place]CWH-FTIMG None  06/09/2022  2:30 PM OJanyth Pupa DO [facility] FTOBGYN  06/16/2022  1:30 PM [place] FTOBGYN [place]-FTIMG None  06/16/2022  2:30 PM OJanyth Pupa DO [facility] FTOBGYN  06/23/2022  1:30 PM [facility] - FTOBGYN [place]-FTIMG None  06/23/2022  2:30 PM OJanyth Pupa DO [facility] FTOBGYN  06/30/2022  1:30 PM [facility] - FTOBGYN [place]-FTIMG None  06/30/2022  2:30 PM OJanyth Pupa DO [facility] FTOBGYN  07/07/2022  1:30 PM [place]- FTOBGYN [place]CWH-FTIMG None  07/07/2022  2:30 PM Airam Runions, LMertie Clause MD [facility] FTOBGYN    Orders Placed This Encounter  Procedures   POC Urinalysis Dipstick OB   LFlorian Buff Attending Physician for the Center for WFarmersvilleGroup 05/19/2022 4:06 PM

## 2022-05-25 ENCOUNTER — Other Ambulatory Visit: Payer: Self-pay | Admitting: Obstetrics & Gynecology

## 2022-05-25 DIAGNOSIS — O24419 Gestational diabetes mellitus in pregnancy, unspecified control: Secondary | ICD-10-CM

## 2022-05-26 ENCOUNTER — Ambulatory Visit (INDEPENDENT_AMBULATORY_CARE_PROVIDER_SITE_OTHER): Payer: Self-pay | Admitting: Obstetrics & Gynecology

## 2022-05-26 ENCOUNTER — Encounter: Payer: Self-pay | Admitting: Obstetrics & Gynecology

## 2022-05-26 ENCOUNTER — Ambulatory Visit (INDEPENDENT_AMBULATORY_CARE_PROVIDER_SITE_OTHER): Payer: Self-pay

## 2022-05-26 VITALS — BP 133/80 | HR 89 | Wt 268.8 lb

## 2022-05-26 DIAGNOSIS — Z98891 History of uterine scar from previous surgery: Secondary | ICD-10-CM

## 2022-05-26 DIAGNOSIS — O24919 Unspecified diabetes mellitus in pregnancy, unspecified trimester: Secondary | ICD-10-CM

## 2022-05-26 DIAGNOSIS — O0993 Supervision of high risk pregnancy, unspecified, third trimester: Secondary | ICD-10-CM

## 2022-05-26 DIAGNOSIS — Z3A33 33 weeks gestation of pregnancy: Secondary | ICD-10-CM

## 2022-05-26 DIAGNOSIS — O24419 Gestational diabetes mellitus in pregnancy, unspecified control: Secondary | ICD-10-CM

## 2022-05-26 DIAGNOSIS — Z794 Long term (current) use of insulin: Secondary | ICD-10-CM

## 2022-05-26 LAB — POCT URINALYSIS DIPSTICK OB
Blood, UA: NEGATIVE
Glucose, UA: NEGATIVE
Ketones, UA: NEGATIVE
Leukocytes, UA: NEGATIVE
Nitrite, UA: NEGATIVE
POC,PROTEIN,UA: NEGATIVE

## 2022-05-26 NOTE — Progress Notes (Signed)
[place] 49+7 wks,cephalic,BPP 0/2,OVZCHYIFOYDXAJ,OIN 33.3 cm,FHR 164 bpm,posterior placenta gr 3

## 2022-05-26 NOTE — Progress Notes (Signed)
HIGH-RISK PREGNANCY VISIT Patient name: Colleen Arnold MRN [id number]  Date of birth: [birth year] Chief Complaint:   High Risk Gestation ([place] today; + pressure)  History of Present Illness:   Colleen Arnold is a 36 y.o. G38P2002 female at 59w5dwith an Estimated Date of Delivery: 07/09/22 being seen today for ongoing management of a high-risk pregnancy complicated by: -class B DM Sugars reviewed fasting above 98, mostly 100s, 2hr PP within normal range  -Polyhydramnios -Prior C-section x 2.    Today she reports  occasional contractions and increased discharge- no itching or odor .   Contractions: Not present. Vag. Bleeding: None.  Movement: Present. denies leaking of fluid.      12/27/2021    3:45 PM 08/04/2020    8:36 AM 07/27/2020    9:29 AM 04/27/2020   10:35 AM  Depression screen PHQ 2/9  Decreased Interest 0 0 0 0  Down, Depressed, Hopeless 0 0 0 0  PHQ - 2 Score 0 0 0 0  Altered sleeping '1  1 1  '$ Tired, decreased energy '1  1 1  '$ Change in appetite 0  0 0  Feeling bad or failure about yourself  0  0 0  Trouble concentrating 0  0 0  Moving slowly or fidgety/restless 0  0 0  Suicidal thoughts 0  0 0  PHQ-9 Score '2  2 2     '$ Current Outpatient Medications  Medication Instructions   Accu-Chek Softclix Lancets lancets Check blood sugar four times a day.   acetaminophen (TYLENOL) 1,000 mg, Oral, As needed   aspirin EC 162 mg, Oral, Daily, Swallow whole.   glucose blood (ACCU-CHEK GUIDE) test strip Check blood sugar four times a day.   glyBURIDE (DIABETA) 5 mg, Oral, Daily with supper   insulin NPH Human (NOVOLIN N) 10 Units, Subcutaneous, Daily at bedtime   INSULIN SYRINGE .5CC/30GX1/2" 1 Units, Does not apply, Nightly   metFORMIN (GLUCOPHAGE) 1000 MG tablet 1 tablet in the morning, 1 tablet at bedtime   pantoprazole (PROTONIX) 20 mg, Oral, Daily   prenatal vitamin w/FE, FA (PRENATAL 1 + 1) 27-1 MG TABS tablet 1 tablet, Oral, Daily     Review of Systems:   Pertinent items  are noted in HPI Denies headaches, visual changes, shortness of breath, chest pain, abdominal pain, severe nausea/vomiting, or problems with urination or bowel movements unless otherwise stated above. Pertinent History Reviewed:  Reviewed past medical,surgical, social, obstetrical and family history.  Reviewed problem list, medications and allergies. Physical Assessment:   Vitals:   05/26/22 1520  BP: 133/80  Pulse: 89  Weight: 268 lb 12.8 oz (121.9 kg)  Body mass index is 47.62 kg/m.           Physical Examination:   General appearance: alert, well appearing, and in no distress  Mental status: normal mood, behavior, speech, dress, motor activity, and thought processes  Skin: warm & dry   Extremities: Edema: Trace    Cardiovascular: normal heart rate noted  Respiratory: normal respiratory effort, no distress  Abdomen: gravid, soft, non-tender  Pelvic: Cervical exam deferred         Fetal Status:     Movement: Present    Fetal Surveillance Testing today: [place]302+4wks,cephalic,BPP 80/9,BDZHGDJMEQASTM,HDQ33.3 cm,FHR 164 bpm,posterior placenta gr 3   Chaperone: N/A    Results for orders placed or performed in visit on 05/26/22 (from the past 24 hour(s))  POC Urinalysis Dipstick OB   Collection Time: 05/26/22  3:13 PM  Result Value Ref Range   Color, UA     Clarity, UA     Glucose, UA Negative Negative   Bilirubin, UA     Ketones, UA neg    Spec Grav, UA     Blood, UA neg    pH, UA     POC,PROTEIN,UA Negative Negative, Trace, Small (1+), Moderate (2+), Large (3+), 4+   Urobilinogen, UA     Nitrite, UA neg    Leukocytes, UA Negative Negative   Appearance     Odor       Assessment & Plan:  High-risk pregnancy: G3P2002 at 31w5dwith an Estimated Date of Delivery: 07/09/22   1) Class B Dm -increased Novolin to 12 u at night -continue with metformin and glyburide -continue weekly testing  2) Polyhydramnios -currently asymptomatic  Discussed plan for delivery @ 38wk  due to polyhydramnios and uncontrolled DM- though sugars have started to improve -Referral created to schedule for 9/9  Meds: No orders of the defined types were placed in this encounter.   Labs/procedures today: BPP  Treatment Plan:  as outlined above  Reviewed: Preterm labor symptoms and general obstetric precautions including but not limited to vaginal bleeding, contractions, leaking of fluid and fetal movement were reviewed in detail with the patient.  All questions were answered. Pt has home bp cuff. Check bp weekly, let uKoreaknow if >140/90.   Follow-up: Return for as scheduled- twice weekly.   Future Appointments  Date Time Provider [place] 06/02/2022 10:00 AM [facility] - FTOBGYN [place]-FTIMG None  06/02/2022 10:50 AM CChristin Fudge CNM [facility]-FT FTOBGYN  06/09/2022  1:30 PM [facility] - FTOBGYN [place]-FTIMG None  06/09/2022  2:30 PM OJanyth Pupa DO [facility]-FT FTOBGYN  06/16/2022  2:10 PM [facility]-FTOBGYN NURSE [facility]-FT FTOBGYN  06/16/2022  2:30 PM OJanyth Pupa DO [facility]-FT FTOBGYN  06/23/2022  1:30 PM [place]- FTOBGYN [place]WH-FTIMG None  06/23/2022  2:30 PM OJanyth Pupa DO [facility]-FT FTOBGYN  06/30/2022  1:30 PM [facility] - FTOBGYN [place]WH-FTIMG None  06/30/2022  2:30 PM OJanyth Pupa DO [facility]-FT FTOBGYN  07/07/2022  1:30 PM [place] FTOBGYN [place]WH-FTIMG None  07/07/2022  2:30 PM Eure, LMertie Clause MD [facility]-FT FTOBGYN    Orders Placed This Encounter  Procedures   POC Urinalysis Dipstick OB    JJanyth Pupa DO Attending OGlencoe FSurgery Center Of Fremont LLCfor WDean Foods Company CLake Pl[place]

## 2022-05-27 ENCOUNTER — Encounter: Payer: Self-pay | Admitting: Obstetrics & Gynecology

## 2022-05-27 ENCOUNTER — Telehealth: Payer: Self-pay | Admitting: *Deleted

## 2022-05-27 NOTE — Telephone Encounter (Signed)
Called patient back. States that she wants to know if there is a payment plan for a tubal? Also wants to know if financial assistance through [employer] would pay for the tubal? Pt then stated that she spoke to [facility] and she told her financial assistance does not cover the tubal and she would need to pay out of pocket. I told her that if this is what she was told that it was not covered. Advised to discuss payment plans with financial staff as I was not able to help with this.

## 2022-06-01 ENCOUNTER — Other Ambulatory Visit: Payer: Self-pay | Admitting: Obstetrics & Gynecology

## 2022-06-01 DIAGNOSIS — O24419 Gestational diabetes mellitus in pregnancy, unspecified control: Secondary | ICD-10-CM

## 2022-06-02 ENCOUNTER — Ambulatory Visit (INDEPENDENT_AMBULATORY_CARE_PROVIDER_SITE_OTHER): Payer: Self-pay | Admitting: Advanced Practice Midwife

## 2022-06-02 ENCOUNTER — Ambulatory Visit (INDEPENDENT_AMBULATORY_CARE_PROVIDER_SITE_OTHER): Payer: Self-pay

## 2022-06-02 ENCOUNTER — Encounter: Payer: Self-pay | Admitting: Advanced Practice Midwife

## 2022-06-02 VITALS — BP 131/84 | HR 85 | Wt 267.0 lb

## 2022-06-02 DIAGNOSIS — Z3A34 34 weeks gestation of pregnancy: Secondary | ICD-10-CM

## 2022-06-02 DIAGNOSIS — O409XX0 Polyhydramnios, unspecified trimester, not applicable or unspecified: Secondary | ICD-10-CM

## 2022-06-02 DIAGNOSIS — O0993 Supervision of high risk pregnancy, unspecified, third trimester: Secondary | ICD-10-CM

## 2022-06-02 DIAGNOSIS — O24415 Gestational diabetes mellitus in pregnancy, controlled by oral hypoglycemic drugs: Secondary | ICD-10-CM

## 2022-06-02 DIAGNOSIS — O24419 Gestational diabetes mellitus in pregnancy, unspecified control: Secondary | ICD-10-CM

## 2022-06-02 LAB — POCT URINALYSIS DIPSTICK OB
Blood, UA: NEGATIVE
Glucose, UA: NEGATIVE
Ketones, UA: NEGATIVE
Leukocytes, UA: NEGATIVE
Nitrite, UA: NEGATIVE

## 2022-06-02 NOTE — Progress Notes (Signed)
HIGH-RISK PREGNANCY VISIT Patient name: Colleen Arnold MRN [id number]  Date of birth: 1985/11/02 Chief Complaint:   Routine Prenatal Visit (BPP/Mild cramping started last night)  History of Present Illness:   Colleen Arnold is a 36 y.o. G81P2002 female at 4w5dwith an Estimated Date of Delivery: 07/09/22 being seen today for ongoing management of a high-risk pregnancy complicated by diabetes mellitus A2/BDM currently on metformin 1gm BID, gly '5mg'$  qhs, novolin 12u q hs .    Today she reports FBS 3/5 >95 2 hr pp all but 1 <120. Contractions: Irritability. Vag. Bleeding: None.  Movement: Present. denies leaking of fluid.      12/27/2021    3:45 PM 08/04/2020    8:36 AM 07/27/2020    9:29 AM 04/27/2020   10:35 AM  Depression screen PHQ 2/9  Decreased Interest 0 0 0 0  Down, Depressed, Hopeless 0 0 0 0  PHQ - 2 Score 0 0 0 0  Altered sleeping '1  1 1  '$ Tired, decreased energy '1  1 1  '$ Change in appetite 0  0 0  Feeling bad or failure about yourself  0  0 0  Trouble concentrating 0  0 0  Moving slowly or fidgety/restless 0  0 0  Suicidal thoughts 0  0 0  PHQ-9 Score '2  2 2        '$ 12/27/2021    3:45 PM 07/27/2020    9:29 AM 04/27/2020   10:35 AM  GAD 7 : Generalized Anxiety Score  Nervous, Anxious, on Edge 0 0 0  Control/stop worrying 0 1 0  Worry too much - different things 0 1 0  Trouble relaxing 0 0 0  Restless 0 0 0  Easily annoyed or irritable 0 0 0  Afraid - awful might happen 0 1 0  Total GAD 7 Score 0 3 0     Review of Systems:   Pertinent items are noted in HPI Denies abnormal vaginal discharge w/ itching/odor/irritation, headaches, visual changes, shortness of breath, chest pain, abdominal pain, severe nausea/vomiting, or problems with urination or bowel movements unless otherwise stated above. Pertinent History Reviewed:  Reviewed past medical,surgical, social, obstetrical and family history.  Reviewed problem list, medications and allergies. Physical Assessment:    Vitals:   06/02/22 1031 06/02/22 1032  BP: (!) 140/86 131/84  Pulse: 85   Weight: 267 lb (121.1 kg)   Body mass index is 47.3 kg/m.           Physical Examination:   General appearance: alert, well appearing, and in no distress  Mental status: alert, oriented to person, place, and time  Skin: warm & dry   Extremities: Edema: None    Cardiovascular: normal heart rate noted  Respiratory: normal respiratory effort, no distress  Abdomen: gravid, soft, non-tender  Pelvic: Cervical exam deferred         Fetal Status:     Movement: Present    Fetal Surveillance Testing today: [place]344+3wks,cephalic,FHR 1154bpm,posterior placenta gr 3,AFI 30 cm,polyhydramnios,BPP 8/8   Chaperone: N/A    Results for orders placed or performed in visit on 06/02/22 (from the past 24 hour(s))  POC Urinalysis Dipstick OB   Collection Time: 06/02/22 10:36 AM  Result Value Ref Range   Color, UA     Clarity, UA     Glucose, UA Negative Negative   Bilirubin, UA     Ketones, UA neg    Spec Grav, UA     Blood,  UA neg    pH, UA     POC,PROTEIN,UA Trace Negative, Trace, Small (1+), Moderate (2+), Large (3+), 4+   Urobilinogen, UA     Nitrite, UA neg    Leukocytes, UA Negative Negative   Appearance     Odor      Assessment & Plan:  High-risk pregnancy: [id number] at 40w5dwith an Estimated Date of Delivery: 07/09/22      ICD-10-CM   1. Supervision of high risk pregnancy in third trimester  O09.93 POC Urinalysis Dipstick OB    2. Gestational diabetes mellitus (GDM) controlled on oral hypoglycemic drug, antepartum  O24.415 POC Urinalysis Dipstick OB   some FBS ^, will ^ pm insulin to 14 mu/min    3. Polyhydramnios affecting pregnancy  O40.9XX0    AFI 31>30 today        Meds: No orders of the defined types were placed in this encounter.   Orders:  Orders Placed This Encounter  Procedures   POC Urinalysis Dipstick OB     Labs/procedures today: none   Reviewed: Preterm labor symptoms and  general obstetric precautions including but not limited to vaginal bleeding, contractions, leaking of fluid and fetal movement were reviewed in detail with the patient.  All questions were answered. Does have home bp cuff. Office bp cuff given: not applicable. Check bp daily, let u[place]ore[place]now if consistently >140 and/or >90.  Follow-up:    Future Appointments  Date Time Provider [place] 06/02/2022 10:50 AM CChristin Fudge CNM [facility]-FT FTOBGYN  06/09/2022  1:30 PM [place]- FTOBGYN [place]CWH-FTIMG None  06/09/2022  2:30 PM OJanyth Pupa DO [facility]-FT FTOBGYN  06/16/2022  2:10 PM [facility]-FTOBGYN NURSE [facility]-FT FTOBGYN  06/16/2022  2:30 PM OJanyth Pupa DO [facility]-FT FTOBGYN  06/23/2022  1:30 PM [facility] - FTOBGYN [place]CWH-FTIMG None  06/23/2022  2:30 PM OJanyth Pupa DO [facility]-FT FTOBGYN  06/30/2022  1:30 PM [facility] - FTOBGYN [place]CWH-FTIMG None  06/30/2022  2:30 PM OJanyth Pupa DO [facility]-FT FTOBGYN  07/07/2022  1:30 PM [place]- FTOBGYN [place]CWH-FTIMG None  07/07/2022  2:30 PM Eure, LMertie Clause MD [facility]-FT FTOBGYN    Orders Placed This Encounter  Procedures   POC Urinalysis Dipstick OB   FChristin Fudge, DNP, CPlattvilleGroup 06/02/2022 10:44 AM

## 2022-06-02 NOTE — Progress Notes (Signed)
[place] 96+8 wks,cephalic,FHR 864 bpm,posterior placenta gr 3,AFI 30 cm,polyhydramnios,BPP 8/8

## 2022-06-08 ENCOUNTER — Other Ambulatory Visit: Payer: Self-pay | Admitting: Obstetrics & Gynecology

## 2022-06-08 DIAGNOSIS — O24419 Gestational diabetes mellitus in pregnancy, unspecified control: Secondary | ICD-10-CM

## 2022-06-09 ENCOUNTER — Encounter: Payer: Self-pay | Admitting: Advanced Practice Midwife

## 2022-06-09 ENCOUNTER — Ambulatory Visit (INDEPENDENT_AMBULATORY_CARE_PROVIDER_SITE_OTHER): Payer: Self-pay | Admitting: Advanced Practice Midwife

## 2022-06-09 ENCOUNTER — Telehealth: Payer: Self-pay | Admitting: Advanced Practice Midwife

## 2022-06-09 ENCOUNTER — Ambulatory Visit (INDEPENDENT_AMBULATORY_CARE_PROVIDER_SITE_OTHER): Payer: Self-pay

## 2022-06-09 ENCOUNTER — Other Ambulatory Visit (HOSPITAL_COMMUNITY)
Admission: RE | Admit: 2022-06-09 | Discharge: 2022-06-09 | Disposition: A | Discharge status: Home / Self Care | Payer: Self-pay | Source: Ambulatory Visit | Attending: Obstetrics & Gynecology | Admitting: Obstetrics & Gynecology

## 2022-06-09 VITALS — BP 122/82 | HR 85 | Wt 272.0 lb

## 2022-06-09 DIAGNOSIS — Z3A35 35 weeks gestation of pregnancy: Secondary | ICD-10-CM | POA: Insufficient documentation

## 2022-06-09 DIAGNOSIS — O24419 Gestational diabetes mellitus in pregnancy, unspecified control: Secondary | ICD-10-CM

## 2022-06-09 DIAGNOSIS — Z1389 Encounter for screening for other disorder: Secondary | ICD-10-CM

## 2022-06-09 DIAGNOSIS — O0993 Supervision of high risk pregnancy, unspecified, third trimester: Secondary | ICD-10-CM

## 2022-06-09 DIAGNOSIS — O24415 Gestational diabetes mellitus in pregnancy, controlled by oral hypoglycemic drugs: Secondary | ICD-10-CM

## 2022-06-09 DIAGNOSIS — O409XX0 Polyhydramnios, unspecified trimester, not applicable or unspecified: Secondary | ICD-10-CM

## 2022-06-09 DIAGNOSIS — Z331 Pregnant state, incidental: Secondary | ICD-10-CM

## 2022-06-09 LAB — POCT URINALYSIS DIPSTICK OB
Blood, UA: NEGATIVE
Glucose, UA: NEGATIVE
Ketones, UA: NEGATIVE
Leukocytes, UA: NEGATIVE
Nitrite, UA: NEGATIVE

## 2022-06-09 NOTE — Telephone Encounter (Signed)
Patient would like her c-section to be moved up to 9/3. Please advise.

## 2022-06-09 NOTE — Progress Notes (Signed)
[place] 79+5 wks,cephalic,BPP 5/8,PRAFOADLK placenta gr 3,AFI 29 cm,polyhydramnios,FHR 146 bpm,EFW 4124 g 99.9%,limited view of head

## 2022-06-09 NOTE — Progress Notes (Signed)
HIGH-RISK PREGNANCY VISIT Patient name: Colleen Arnold MRN [id number]  Date of birth: [birth year] Chief Complaint:   Routine Prenatal Visit, High Risk Gestation, and Pregnancy Ultrasound (Vaginal discharge)  History of Present Illness:   Colleen Arnold is a 36 y.o. G52P2002 female at 47w5dwith an Estimated Date of Delivery: 07/09/22 being seen today for ongoing management of a high-risk pregnancy complicated by diabetes mellitus A2/BDM currently on metformin, glyburide and insulin  and polyhydramnios.    Today she reports FBS:  all normal except 1 since last visit.  All but 2 pp normal.  . Contractions: Irritability.  .  Movement: Present. denies leaking of fluid. Feeling pressure     12/27/2021    3:45 PM 08/04/2020    8:36 AM 07/27/2020    9:29 AM 04/27/2020   10:35 AM  Depression screen PHQ 2/9  Decreased Interest 0 0 0 0  Down, Depressed, Hopeless 0 0 0 0  PHQ - 2 Score 0 0 0 0  Altered sleeping '1  1 1  '$ Tired, decreased energy '1  1 1  '$ Change in appetite 0  0 0  Feeling bad or failure about yourself  0  0 0  Trouble concentrating 0  0 0  Moving slowly or fidgety/restless 0  0 0  Suicidal thoughts 0  0 0  PHQ-9 Score '2  2 2        '$ 12/27/2021    3:45 PM 07/27/2020    9:29 AM 04/27/2020   10:35 AM  GAD 7 : Generalized Anxiety Score  Nervous, Anxious, on Edge 0 0 0  Control/stop worrying 0 1 0  Worry too much - different things 0 1 0  Trouble relaxing 0 0 0  Restless 0 0 0  Easily annoyed or irritable 0 0 0  Afraid - awful might happen 0 1 0  Total GAD 7 Score 0 3 0     Review of Systems:   Pertinent items are noted in HPI Denies abnormal vaginal discharge w/ itching/odor/irritation, headaches, visual changes, shortness of breath, chest pain, abdominal pain, severe nausea/vomiting, or problems with urination or bowel movements unless otherwise stated above. Pertinent History Reviewed:  Reviewed past medical,surgical, social, obstetrical and family history.  Reviewed  problem list, medications and allergies. Physical Assessment:   Vitals:   06/09/22 1410  BP: 122/82  Pulse: 85  Weight: 272 lb (123.4 kg)  Body mass index is 48.18 kg/m.           Physical Examination:   General appearance: alert, well appearing, and in no distress  Mental status: alert, oriented to person, place, and time  Skin: warm & dry   Extremities: Edema: Trace    Cardiovascular: normal heart rate noted  Respiratory: normal respiratory effort, no distress  Abdomen: gravid, soft, non-tender  Pelvic: Cervical exam performed  Dilation: Fingertip Effacement (%): Thick    Fetal Status:     Movement: Present Presentation: VertexUS 340+9wks,cephalic,BPP 88/1,XBJYNWGNFplacenta gr 3,AFI 29 cm,polyhydramnios,FHR 146 bpm,EFW 4124 g 99.9%,limited view of head      Results for orders placed or performed in visit on 06/09/22 (from the past 24 hour(s))  POC Urinalysis Dipstick OB   Collection Time: 06/09/22  2:24 PM  Result Value Ref Range   Color, UA     Clarity, UA     Glucose, UA Negative Negative   Bilirubin, UA     Ketones, UA neg    Spec Grav, UA     Blood,  UA neg    pH, UA     POC,PROTEIN,UA Trace Negative, Trace, Small (1+), Moderate (2+), Large (3+), 4+   Urobilinogen, UA     Nitrite, UA neg    Leukocytes, UA Negative Negative   Appearance     Odor      Assessment & Plan:  High-risk pregnancy: H7W2637 at 5w5dwith an Estimated Date of Delivery: 07/09/22      ICD-10-CM   1. Supervision of high risk pregnancy in third trimester  O09.93 Cervicovaginal ancillary only    Culture, beta strep (group b only)    2. Pregnant state, incidental  Z33.1 POC Urinalysis Dipstick OB    3. Screening for genitourinary condition  Z13.89 POC Urinalysis Dipstick OB    4. Gestational diabetes mellitus (GDM) controlled on oral hypoglycemic drug, antepartum  O24.415    MFM recommends delivery ~ 36 weeks for A2DM/poly--agreed today to deliver early    5. [redacted] weeks gestation of  pregnancy  Z3A.35 Cervicovaginal ancillary only    Culture, beta strep (group b only)    6. Polyhydramnios affecting pregnancy  O40.9XX0         Meds: No orders of the defined types were placed in this encounter.   Orders:  Orders Placed This Encounter  Procedures   Culture, beta strep (group b only)   POC Urinalysis Dipstick OB     Labs/procedures today: GBS, GC/CT, and SVE   Reviewed: Preterm labor symptoms and general obstetric precautions including but not limited to vaginal bleeding, contractions, leaking of fluid and fetal movement were reviewed in detail with the patient.  All questions were answered.   Follow-up:    Future Appointments  Date Time Provider [place] 06/16/2022  2:10 PM [facility]-FTOBGYN NURSE [facility]-FT FTOBGYN  06/16/2022  2:30 PM OJanyth Pupa DO [facility]-FT FTOBGYN  06/23/2022  1:30 PM [place]- FTOBGYN [place]CWH-FTIMG None  06/23/2022  2:30 PM OJanyth Pupa DO [facility]-FT FTOBGYN    Orders Placed This Encounter  Procedures   Culture, beta strep (group b only)   POC Urinalysis Dipstick OB   FChristin Fudge, DNP, CVineyardGroup 06/09/2022 4:22 PM

## 2022-06-10 ENCOUNTER — Encounter (HOSPITAL_COMMUNITY): Payer: Self-pay | Admitting: Obstetrics & Gynecology

## 2022-06-10 ENCOUNTER — Encounter (HOSPITAL_COMMUNITY): Payer: Self-pay

## 2022-06-10 NOTE — Patient Instructions (Signed)
Purva Vessell  06/10/2022   Your procedure is scheduled on:  06/12/2022  Arrive at [facility] at Entrance C on [address] at [address] [address]  and [address]. You are invited to use the FREE valet parking or use the Visitor's parking deck.  Pick up the phone at the desk and dial [phone].  Call this number if you have problems the morning of surgery: [phone]  Remember:   Do not eat food:(After Midnight) Desps de medianoche.  Do not drink clear liquids: (After Midnight) Desps de medianoche.  Take these medicines the morning of surgery with A SIP OF WATER:  Take half of the amount of prescribed insulin the night before surgery.  No medication on day of surgery   Do not wear jewelry, make-up or nail polish.  Do not wear lotions, powders, or perfumes. Do not wear deodorant.  Do not shave 48 hours prior to surgery.  Do not bring valuables to the hospital.  [facility] is not   responsible for any belongings or valuables brought to the hospital.  Contacts, dentures or bridgework may not be worn into surgery.  Leave suitcase in the car. After surgery it may be brought to your room.  For patients admitted to the hospital, checkout time is 11:00 AM the day of              discharge.      Please read over the following fact sheets that you were given:     Preparing for Surgery                                           Instrucciones Para Antes de la Ciruga   Su ciruga est programada para 07/13/2022  (your procedure is scheduled on) [place] por la entrada [facility]  a [place] -(enter through the main entrance at [facility] [place], [place] el [phone] para informarnos de su llegada. (pick up phone, dial [phone] on arrival)     Por favor llame al [phone] si tiene algn problema en la maana de la ciruga (please call this number if you have any problems the morning of surgery.)                  Recuerde:  (Remember)  No coma alimentos. (Do not eat food (After Midnight) Desps de medianoche)    No tome lquidos claros. (Do not drink clear liquids (After Midnight) Desps de medianoche)    No use joyas, maquillaje de ojos, lpiz labial, crema para el cuerpo o esmalte de uas oscuro. (Do not wear jewelry, eye makeup, lipstick, body lotion, or dark fingernail polish). Puede usar desodorante (you may wear deodorant)    No se afeite 48 horas de su ciruga. (Do not shave 48 hours before your surgery)    No traiga objetos de valor al hospital.  [facility] no se hace responsable de ninguna pertenencia, ni objetos de valor que haya trado al hospital. (Do not bring valuable to the hospital.  [facility] is not responsible for any belongings or valuables brought to the hospital)   [facility] medicinas en la maana de la ciruga con un SORBITO de agua 7 units of insulin the night before surgery.  No insulin on day of surgery (take these meds the morning of surgery with  a SIP of water)     Durante la ciruga no se pueden usar lentes de contacto, dentaduras o puentes. (Contacts, dentures or bridgework cannot be worn in surgery).   Si va a ser ingresado despus de la ciruga, deje la [employer] en el carro hasta que se le haya asignado una habitacin. (If you are to be admitted after surgery, leave suitcase in car until your room has been assigned.)   A los pacientes que se les d de alta el mismo da no se les permitir manejar a casa.  (Patients discharged on the day of surgery will not be allowed to drive home)    [place] de telfono del Designer, television/film set. (Name and telephone number of your driver)   Instrucciones especiales Shower using CHG 2 nights before surgery and the night before surgery.  If you shower the day of surgery use CHG.  Use special wash - you have one bottle of CHG for all showers.  You should use approximately 1/3 of the bottle for each shower. (Special Instructions)   Por  favor, lea las hojas informativas que le entregaron. (Please read over the following fact sheets that you were given) Surgical Site Infection Prevention

## 2022-06-12 ENCOUNTER — Inpatient Hospital Stay (HOSPITAL_COMMUNITY): Payer: Medicaid Other

## 2022-06-12 ENCOUNTER — Inpatient Hospital Stay (HOSPITAL_COMMUNITY)
Admission: RE | Admit: 2022-06-12 | Discharge: 2022-06-14 | DRG: 785 | Disposition: A | Discharge status: Home / Self Care | Payer: Medicaid Other | Source: Ambulatory Visit | Attending: Obstetrics & Gynecology | Admitting: Obstetrics & Gynecology

## 2022-06-12 ENCOUNTER — Encounter (HOSPITAL_COMMUNITY): Payer: Self-pay | Admitting: Obstetrics & Gynecology

## 2022-06-12 ENCOUNTER — Other Ambulatory Visit: Payer: Self-pay

## 2022-06-12 ENCOUNTER — Encounter (HOSPITAL_COMMUNITY)
Admission: RE | Disposition: A | Discharge status: Home / Self Care | Payer: Self-pay | Source: Ambulatory Visit | Attending: Obstetrics & Gynecology

## 2022-06-12 DIAGNOSIS — O34219 Maternal care for unspecified type scar from previous cesarean delivery: Secondary | ICD-10-CM

## 2022-06-12 DIAGNOSIS — Z3A36 36 weeks gestation of pregnancy: Secondary | ICD-10-CM

## 2022-06-12 DIAGNOSIS — O9902 Anemia complicating childbirth: Secondary | ICD-10-CM | POA: Diagnosis present

## 2022-06-12 DIAGNOSIS — O99214 Obesity complicating childbirth: Secondary | ICD-10-CM

## 2022-06-12 DIAGNOSIS — Z302 Encounter for sterilization: Secondary | ICD-10-CM

## 2022-06-12 DIAGNOSIS — O0993 Supervision of high risk pregnancy, unspecified, third trimester: Principal | ICD-10-CM

## 2022-06-12 DIAGNOSIS — O34211 Maternal care for low transverse scar from previous cesarean delivery: Secondary | ICD-10-CM | POA: Diagnosis present

## 2022-06-12 DIAGNOSIS — O24429 Gestational diabetes mellitus in childbirth, unspecified control: Secondary | ICD-10-CM

## 2022-06-12 DIAGNOSIS — O24420 Gestational diabetes mellitus in childbirth, diet controlled: Secondary | ICD-10-CM

## 2022-06-12 DIAGNOSIS — O3663X0 Maternal care for excessive fetal growth, third trimester, not applicable or unspecified: Secondary | ICD-10-CM | POA: Diagnosis present

## 2022-06-12 DIAGNOSIS — O403XX0 Polyhydramnios, third trimester, not applicable or unspecified: Secondary | ICD-10-CM | POA: Diagnosis present

## 2022-06-12 DIAGNOSIS — E669 Obesity, unspecified: Secondary | ICD-10-CM

## 2022-06-12 DIAGNOSIS — Z7982 Long term (current) use of aspirin: Secondary | ICD-10-CM | POA: Diagnosis not present

## 2022-06-12 DIAGNOSIS — O409XX0 Polyhydramnios, unspecified trimester, not applicable or unspecified: Secondary | ICD-10-CM | POA: Diagnosis present

## 2022-06-12 DIAGNOSIS — O403XX1 Polyhydramnios, third trimester, fetus 1: Secondary | ICD-10-CM

## 2022-06-12 DIAGNOSIS — O24425 Gestational diabetes mellitus in childbirth, controlled by oral hypoglycemic drugs: Secondary | ICD-10-CM | POA: Diagnosis present

## 2022-06-12 HISTORY — DX: Gestational (pregnancy-induced) hypertension without significant proteinuria, unspecified trimester: O13.9

## 2022-06-12 LAB — TYPE AND SCREEN
ABO/RH(D): O POS
Antibody Screen: NEGATIVE

## 2022-06-12 LAB — GLUCOSE, CAPILLARY
Glucose-Capillary: 104 mg/dL — ABNORMAL HIGH (ref 70–99)
Glucose-Capillary: 124 mg/dL — ABNORMAL HIGH (ref 70–99)
Glucose-Capillary: 137 mg/dL — ABNORMAL HIGH (ref 70–99)
Glucose-Capillary: 97 mg/dL (ref 70–99)

## 2022-06-12 LAB — CBC
HCT: 33.5 % — ABNORMAL LOW (ref 36.0–46.0)
Hemoglobin: 11.5 g/dL — ABNORMAL LOW (ref 12.0–15.0)
MCH: 32.7 pg (ref 26.0–34.0)
MCHC: 34.3 g/dL (ref 30.0–36.0)
MCV: 95.2 fL (ref 80.0–100.0)
Platelets: 194 10*3/uL (ref 150–400)
RBC: 3.52 MIL/uL — ABNORMAL LOW (ref 3.87–5.11)
RDW: 13.3 % (ref 11.5–15.5)
WBC: 7.5 10*3/uL (ref 4.0–10.5)
nRBC: 0 % (ref 0.0–0.2)

## 2022-06-12 LAB — RPR: RPR Ser Ql: NONREACTIVE

## 2022-06-12 SURGERY — Surgical Case
Anesthesia: Spinal | Wound class: Clean Contaminated

## 2022-06-12 MED ORDER — DEXAMETHASONE SODIUM PHOSPHATE 4 MG/ML IJ SOLN
INTRAMUSCULAR | Status: AC
  Filled 2022-06-12: qty 2

## 2022-06-12 MED ORDER — OXYTOCIN-SODIUM CHLORIDE 30-0.9 UT/500ML-% IV SOLN
INTRAVENOUS | Status: DC | PRN
  Administered 2022-06-12: 300 mL via INTRAVENOUS

## 2022-06-12 MED ORDER — SIMETHICONE 80 MG PO CHEW
80.0000 mg | CHEWABLE_TABLET | Freq: Three times a day (TID) | ORAL | Status: DC
  Administered 2022-06-12 – 2022-06-14 (×6): 80 mg via ORAL
  Filled 2022-06-12 (×6): qty 1

## 2022-06-12 MED ORDER — TRANEXAMIC ACID-NACL 1000-0.7 MG/100ML-% IV SOLN
INTRAVENOUS | Status: AC
  Filled 2022-06-12: qty 100

## 2022-06-12 MED ORDER — WITCH HAZEL-GLYCERIN EX PADS
1.0000 | MEDICATED_PAD | CUTANEOUS | Status: DC | PRN
Start: 2022-06-12 — End: 2022-06-14

## 2022-06-12 MED ORDER — NALOXONE HCL 4 MG/10ML IJ SOLN: 1.0000 ug/kg/h | INTRAVENOUS | Status: DC | PRN

## 2022-06-12 MED ORDER — KETOROLAC TROMETHAMINE 30 MG/ML IJ SOLN: 30.0000 mg | Freq: Four times a day (QID) | INTRAMUSCULAR | Status: AC | PRN

## 2022-06-12 MED ORDER — METRONIDAZOLE 500 MG/100ML IV SOLN
500.0000 mg | INTRAVENOUS | Status: AC
  Administered 2022-06-12: 500 mg via INTRAVENOUS
  Filled 2022-06-12 (×2): qty 100

## 2022-06-12 MED ORDER — PHENYLEPHRINE HCL-NACL 20-0.9 MG/250ML-% IV SOLN
INTRAVENOUS | Status: DC | PRN
  Administered 2022-06-12: 60 ug/min via INTRAVENOUS

## 2022-06-12 MED ORDER — SODIUM CHLORIDE 0.9% FLUSH: 3.0000 mL | INTRAVENOUS | Status: DC | PRN

## 2022-06-12 MED ORDER — DEXAMETHASONE SODIUM PHOSPHATE 10 MG/ML IJ SOLN
INTRAMUSCULAR | Status: DC | PRN
  Administered 2022-06-12: 5 mg via INTRAVENOUS

## 2022-06-12 MED ORDER — PHENYLEPHRINE HCL-NACL 20-0.9 MG/250ML-% IV SOLN
INTRAVENOUS | Status: AC
  Filled 2022-06-12: qty 250

## 2022-06-12 MED ORDER — KETOROLAC TROMETHAMINE 30 MG/ML IJ SOLN
30.0000 mg | Freq: Four times a day (QID) | INTRAMUSCULAR | Status: AC
  Administered 2022-06-12 – 2022-06-13 (×4): 30 mg via INTRAVENOUS
  Filled 2022-06-12 (×4): qty 1

## 2022-06-12 MED ORDER — TRANEXAMIC ACID-NACL 1000-0.7 MG/100ML-% IV SOLN
INTRAVENOUS | Status: DC | PRN
  Administered 2022-06-12: 1000 mg via INTRAVENOUS

## 2022-06-12 MED ORDER — OXYTOCIN-SODIUM CHLORIDE 30-0.9 UT/500ML-% IV SOLN
INTRAVENOUS | Status: AC
  Filled 2022-06-12: qty 500

## 2022-06-12 MED ORDER — MENTHOL 3 MG MT LOZG: 1.0000 | LOZENGE | OROMUCOSAL | Status: DC | PRN

## 2022-06-12 MED ORDER — PRENATAL MULTIVITAMIN CH
1.0000 | ORAL_TABLET | Freq: Every day | ORAL | Status: DC
  Administered 2022-06-12 – 2022-06-14 (×3): 1 via ORAL
  Filled 2022-06-12 (×3): qty 1

## 2022-06-12 MED ORDER — OXYCODONE HCL 5 MG PO TABS
5.0000 mg | ORAL_TABLET | ORAL | Status: DC | PRN
  Administered 2022-06-13: 5 mg via ORAL
  Administered 2022-06-13 – 2022-06-14 (×3): 10 mg via ORAL
  Filled 2022-06-12: qty 2
  Filled 2022-06-12: qty 1
  Filled 2022-06-12 (×2): qty 2

## 2022-06-12 MED ORDER — ACETAMINOPHEN 325 MG PO TABS
650.0000 mg | ORAL_TABLET | ORAL | Status: DC | PRN
  Administered 2022-06-12 – 2022-06-14 (×7): 650 mg via ORAL
  Filled 2022-06-12 (×7): qty 2

## 2022-06-12 MED ORDER — STERILE WATER FOR IRRIGATION IR SOLN
Status: DC | PRN
  Administered 2022-06-12: 1

## 2022-06-12 MED ORDER — MORPHINE SULFATE (PF) 0.5 MG/ML IJ SOLN
INTRAMUSCULAR | Status: DC | PRN
  Administered 2022-06-12: 150 ug via INTRATHECAL

## 2022-06-12 MED ORDER — KETOROLAC TROMETHAMINE 30 MG/ML IJ SOLN
30.0000 mg | Freq: Four times a day (QID) | INTRAMUSCULAR | Status: AC | PRN
  Administered 2022-06-12: 30 mg via INTRAVENOUS

## 2022-06-12 MED ORDER — MEASLES, MUMPS & RUBELLA VAC IJ SOLR: 0.5000 mL | Freq: Once | INTRAMUSCULAR | Status: DC

## 2022-06-12 MED ORDER — FENTANYL CITRATE (PF) 100 MCG/2ML IJ SOLN
INTRAMUSCULAR | Status: DC | PRN
  Administered 2022-06-12: 15 ug via INTRATHECAL

## 2022-06-12 MED ORDER — NALOXONE HCL 0.4 MG/ML IJ SOLN: 0.4000 mg | INTRAMUSCULAR | Status: DC | PRN

## 2022-06-12 MED ORDER — KETOROLAC TROMETHAMINE 30 MG/ML IJ SOLN
INTRAMUSCULAR | Status: AC
  Filled 2022-06-12: qty 1

## 2022-06-12 MED ORDER — DIBUCAINE (PERIANAL) 1 % EX OINT: 1.0000 | TOPICAL_OINTMENT | CUTANEOUS | Status: DC | PRN

## 2022-06-12 MED ORDER — ONDANSETRON HCL 4 MG/2ML IJ SOLN
INTRAMUSCULAR | Status: AC
  Filled 2022-06-12: qty 2

## 2022-06-12 MED ORDER — POVIDONE-IODINE 10 % EX SWAB: 2.0000 | Freq: Once | CUTANEOUS | Status: DC

## 2022-06-12 MED ORDER — LACTATED RINGERS IV SOLN: INTRAVENOUS | Status: DC

## 2022-06-12 MED ORDER — SIMETHICONE 80 MG PO CHEW: 80.0000 mg | CHEWABLE_TABLET | ORAL | Status: DC | PRN

## 2022-06-12 MED ORDER — MEPERIDINE HCL 25 MG/ML IJ SOLN: 6.2500 mg | INTRAMUSCULAR | Status: DC | PRN

## 2022-06-12 MED ORDER — ONDANSETRON HCL 4 MG/2ML IJ SOLN
INTRAMUSCULAR | Status: DC | PRN
  Administered 2022-06-12: 4 mg via INTRAVENOUS

## 2022-06-12 MED ORDER — FENTANYL CITRATE (PF) 100 MCG/2ML IJ SOLN
INTRAMUSCULAR | Status: AC
  Filled 2022-06-12: qty 2

## 2022-06-12 MED ORDER — ACETAMINOPHEN 10 MG/ML IV SOLN
INTRAVENOUS | Status: AC
  Filled 2022-06-12: qty 100

## 2022-06-12 MED ORDER — TETANUS-DIPHTH-ACELL PERTUSSIS 5-2.5-18.5 LF-MCG/0.5 IM SUSY: 0.5000 mL | PREFILLED_SYRINGE | Freq: Once | INTRAMUSCULAR | Status: DC

## 2022-06-12 MED ORDER — SENNOSIDES-DOCUSATE SODIUM 8.6-50 MG PO TABS
2.0000 | ORAL_TABLET | Freq: Every day | ORAL | Status: DC
  Administered 2022-06-13 – 2022-06-14 (×2): 2 via ORAL
  Filled 2022-06-12 (×2): qty 2

## 2022-06-12 MED ORDER — SCOPOLAMINE 1 MG/3DAYS TD PT72
MEDICATED_PATCH | TRANSDERMAL | Status: AC
  Filled 2022-06-12: qty 1

## 2022-06-12 MED ORDER — BUPIVACAINE IN DEXTROSE 0.75-8.25 % IT SOLN
INTRATHECAL | Status: DC | PRN
  Administered 2022-06-12: 1.5 mL via INTRATHECAL

## 2022-06-12 MED ORDER — DIPHENHYDRAMINE HCL 25 MG PO CAPS: 25.0000 mg | ORAL_CAPSULE | ORAL | Status: DC | PRN

## 2022-06-12 MED ORDER — ENOXAPARIN SODIUM 60 MG/0.6ML IJ SOSY
60.0000 mg | PREFILLED_SYRINGE | INTRAMUSCULAR | Status: DC
  Administered 2022-06-13 – 2022-06-14 (×2): 60 mg via SUBCUTANEOUS
  Filled 2022-06-12 (×2): qty 0.6

## 2022-06-12 MED ORDER — SODIUM CHLORIDE 0.9 % IR SOLN
Status: DC | PRN
  Administered 2022-06-12: 1

## 2022-06-12 MED ORDER — COCONUT OIL OIL
1.0000 | TOPICAL_OIL | Status: DC | PRN
Start: 2022-06-12 — End: 2022-06-14

## 2022-06-12 MED ORDER — DIPHENHYDRAMINE HCL 50 MG/ML IJ SOLN
12.5000 mg | INTRAMUSCULAR | Status: DC | PRN
  Administered 2022-06-12: 12.5 mg via INTRAVENOUS
  Filled 2022-06-12: qty 1

## 2022-06-12 MED ORDER — PHENYLEPHRINE 80 MCG/ML (10ML) SYRINGE FOR IV PUSH (FOR BLOOD PRESSURE SUPPORT)
PREFILLED_SYRINGE | INTRAVENOUS | Status: DC | PRN
  Administered 2022-06-12 (×2): 80 ug via INTRAVENOUS

## 2022-06-12 MED ORDER — IBUPROFEN 600 MG PO TABS
600.0000 mg | ORAL_TABLET | Freq: Four times a day (QID) | ORAL | Status: DC
  Administered 2022-06-13 – 2022-06-14 (×4): 600 mg via ORAL
  Filled 2022-06-12 (×5): qty 1

## 2022-06-12 MED ORDER — OXYTOCIN-SODIUM CHLORIDE 30-0.9 UT/500ML-% IV SOLN: 2.5000 [IU]/h | INTRAVENOUS | Status: AC

## 2022-06-12 MED ORDER — MORPHINE SULFATE (PF) 0.5 MG/ML IJ SOLN
INTRAMUSCULAR | Status: AC
  Filled 2022-06-12: qty 10

## 2022-06-12 MED ORDER — SCOPOLAMINE 1 MG/3DAYS TD PT72
1.0000 | MEDICATED_PATCH | Freq: Once | TRANSDERMAL | Status: DC
  Administered 2022-06-12: 1.5 mg via TRANSDERMAL

## 2022-06-12 MED ORDER — ONDANSETRON HCL 4 MG/2ML IJ SOLN: 4.0000 mg | Freq: Three times a day (TID) | INTRAMUSCULAR | Status: DC | PRN

## 2022-06-12 MED ORDER — GENTAMICIN SULFATE 40 MG/ML IJ SOLN
5.0000 mg/kg | INTRAVENOUS | Status: AC
  Administered 2022-06-12: 404 mg via INTRAVENOUS
  Filled 2022-06-12: qty 10

## 2022-06-12 SURGICAL SUPPLY — 36 items
BENZOIN TINCTURE PRP APPL 2/3 (GAUZE/BANDAGES/DRESSINGS) ×1 IMPLANT
CHLORAPREP W/TINT 26 (MISCELLANEOUS) ×2 IMPLANT
CLAMP CORD UMBIL (MISCELLANEOUS) ×1 IMPLANT
CLOTH BEACON ORANGE TIMEOUT ST (SAFETY) ×1 IMPLANT
DERMABOND ADVANCED (GAUZE/BANDAGES/DRESSINGS) ×1
DERMABOND ADVANCED .7 DNX12 (GAUZE/BANDAGES/DRESSINGS) IMPLANT
DRSG OPSITE POSTOP 4X10 (GAUZE/BANDAGES/DRESSINGS) ×1 IMPLANT
ELECT REM PT RETURN 9FT ADLT (ELECTROSURGICAL) ×1
ELECTRODE REM PT RTRN 9FT ADLT (ELECTROSURGICAL) ×1 IMPLANT
EXTRACTOR VACUUM KIWI (MISCELLANEOUS) IMPLANT
GLOVE BIOGEL PI IND STRL 7.0 (GLOVE) ×3 IMPLANT
GLOVE BIOGEL PI INDICATOR 7.0 (GLOVE) ×3
GLOVE ECLIPSE 6.5 STRL STRAW (GLOVE) ×1 IMPLANT
GOWN STRL REUS W/TWL LRG LVL3 (GOWN DISPOSABLE) ×2 IMPLANT
KIT ABG SYR 3ML LUER SLIP (SYRINGE) IMPLANT
NDL HYPO 25X5/8 SAFETYGLIDE (NEEDLE) IMPLANT
NEEDLE HYPO 25X5/8 SAFETYGLIDE (NEEDLE) IMPLANT
NS IRRIG 1000ML POUR BTL (IV SOLUTION) ×1 IMPLANT
PACK C SECTION WH (CUSTOM PROCEDURE TRAY) ×1 IMPLANT
PAD ABD 7.5X8 STRL (GAUZE/BANDAGES/DRESSINGS) ×1 IMPLANT
PAD OB MATERNITY 4.3X12.25 (PERSONAL CARE ITEMS) ×1 IMPLANT
PENCIL SMOKE EVAC W/HOLSTER (ELECTROSURGICAL) ×1 IMPLANT
RTRCTR C-SECT PINK 25CM LRG (MISCELLANEOUS) ×1 IMPLANT
STRIP CLOSURE SKIN 1/2X4 (GAUZE/BANDAGES/DRESSINGS) ×1 IMPLANT
SUT PLAIN 0 NONE (SUTURE) ×1 IMPLANT
SUT PLAIN 2 0 XLH (SUTURE) IMPLANT
SUT VIC AB 0 CT1 27 (SUTURE) ×2
SUT VIC AB 0 CT1 27XBRD ANBCTR (SUTURE) ×2 IMPLANT
SUT VIC AB 0 CTX 36 (SUTURE) ×3
SUT VIC AB 0 CTX36XBRD ANBCTRL (SUTURE) ×3 IMPLANT
SUT VIC AB 2-0 CT1 27 (SUTURE) ×1
SUT VIC AB 2-0 CT1 TAPERPNT 27 (SUTURE) ×1 IMPLANT
SUT VIC AB 4-0 KS 27 (SUTURE) ×1 IMPLANT
TOWEL OR 17X24 6PK STRL BLUE (TOWEL DISPOSABLE) ×1 IMPLANT
TRAY FOLEY W/BAG SLVR 14FR LF (SET/KITS/TRAYS/PACK) IMPLANT
WATER STERILE IRR 1000ML POUR (IV SOLUTION) ×1 IMPLANT

## 2022-06-12 NOTE — Op Note (Addendum)
PreOp Diagnosis: 1) Intrauterine pregnancy @ 24w1d2) Uncontrolled Class B DM 3) Polyhydramnios 4) Suspected macrosomia 5) Prior C-section x 2 6) Obesity 7) Desires sterilization 8) prior left salpingo-oophorectomy  PostOp Diagnosis: same Procedure: Repeat C-section and right salpingectomy Surgeon: Dr. JJanyth PupaAssistant: Dr. PRadene GunningAnesthesia: spinal Complications: none EBL: 368cc UOP: 100cc Fluids: 1000cc  Findings: Female infant from vertex presentation.  Normal uterus, absent left tube and ovary. Normal right tube and ovary   PROCEDURE:  Informed consent was obtained from the patient with risks, benefits, complications, treatment options, and expected outcomes discussed with the patient.  The patient concurred with the proposed plan, giving informed consent with form signed.   The patient was taken to Operating Room, and identified with the procedure verified as C-Section Delivery with Time Out. With induction of anesthesia, the patient was prepped and draped in the usual sterile fashion. A Pfannenstiel incision was made and carried down through the subcutaneous tissue to the fascia. The fascia was incised in the midline and extended transversely. The superior aspect of the fascial incision was grasped with Kochers elevated and the underlying muscle dissected off. The inferior aspect of the facial incision was in similar fashion, grasped elevated and rectus muscles dissected off. The peritoneum was identified and entered. Peritoneal incision was extended longitudinally. Alexis retractor was placed.   A low transverse uterine incision was made and the infants head delivered atraumatically. After the umbilical cord was clamped and cut cord blood was obtained for evaluation.   The placenta was removed intact and appeared normal. The uterine outline, tubes and ovaries appeared normal. The uterine incision was closed with running locked sutures of 0 Vicryl and a second layer of the  same stitch was used in an imbricating fashion.  Excellent hemostasis was obtained.   The left side was examined and confirmed prior removal.  Attention was then turned to the right side.  The right fallopian tube was identified and grasped with [place]. [name] forceps were placed on the mesosalpinx underneath most of the tube.  This pedicle was double suture ligated with 2-0 Vicryl, and the tube including the fimbriated end was excised.   Good hemostasis was noted overall.   Alexis retractor was removed.  Peritoneum closed in a running lock fashion. The fascia was then reapproximated with running sutures of 0 Vicryl. The subcutaneous tissue was reapproximated with 2-0 plain gut suture.  The skin was closed with 4-0 vicryl in a subcuticular fashion.  Instrument, sponge, and needle counts were correct prior the abdominal closure and at the conclusion of the case. The patient was taken to recovery in stable condition.   An experienced assistant was required given the standard of surgical care given the complexity of the case.  This assistant was needed for exposure, dissection, suctioning, retraction, instrument exchange,  assisting with delivery with administration of fundal pressure, and for overall help during the procedure.   JJanyth Pupa DO Attending OLajas FMethodist West Hospitalfor WDean Foods Company CSparta

## 2022-06-12 NOTE — Anesthesia Preprocedure Evaluation (Signed)
Anesthesia Evaluation  Patient identified by MRN, date of birth, ID band Patient awake    Reviewed: Allergy & Precautions, NPO status , Patient's Chart, lab work & pertinent test results  Airway Mallampati: III  TM Distance: >3 FB Neck ROM: Full    Dental no notable dental hx. (+) Teeth Intact, Dental Advisory Given   Pulmonary  Covid PND   Pulmonary exam normal breath sounds clear to auscultation       Cardiovascular Exercise Tolerance: Good negative cardio ROS Normal cardiovascular exam Rhythm:Regular Rate:Normal     Neuro/Psych    GI/Hepatic Neg liver ROS, GERD  Medicated,  Endo/Other  diabetes, Gestational  Renal/GU negative Renal ROS     Musculoskeletal negative musculoskeletal ROS (+)   Abdominal (+) + obese,   Peds  Hematology   Anesthesia Other Findings   Reproductive/Obstetrics (+) Pregnancy                             Anesthesia Physical  Anesthesia Plan  ASA: 3  Anesthesia Plan: Spinal   Post-op Pain Management:    Induction:   PONV Risk Score and Plan: 2 and Treatment may vary due to age or medical condition  Airway Management Planned: Natural Airway  Additional Equipment: None  Intra-op Plan:   Post-operative Plan:   Informed Consent: I have reviewed the patients History and Physical, chart, labs and discussed the procedure including the risks, benefits and alternatives for the proposed anesthesia with the patient or authorized representative who has indicated his/her understanding and acceptance.     Dental advisory given and Interpreter used for interveiw  Plan Discussed with: CRNA and Anesthesiologist  Anesthesia Plan Comments:         Anesthesia Quick Evaluation

## 2022-06-12 NOTE — Lactation Note (Signed)
This note was copied from a baby's chart. Lactation Consultation Note  Patient Name: Colleen Arnold PZWCH'E Date: 06/12/2022 Reason for consult: Initial assessment;Late-preterm 34-36.6wks;Maternal endocrine disorder Age:35 hours BF parent holding baby STS under her gown. BF parent stated that she has put the baby to the breast but she has no milk. BF parent stated she has pumped for 30 minutes and nothing came out. Explained that is normal. Hand expression demonstrated w/nothing noted at this time. BF parent didn't BF her other 2 children. BF parent stated she is going to formula feed and pump and when her milk comes in she will pump and bottle feed and put the baby to the breast then but she wants to know if the baby is getting anything so she is going to formula feed. LPI information sheet given. Kim in [facility] asked [place] to put down amount that is calculated by the kg to give the amount the baby needs to be supplemented after BF. ( This is going by the new LPI standards per Dr. Theresia Lo to tell mom to give that amount after BF). It is more than the amount that to be given if the baby was just formula fed. [place] asked BF parent to asked the Dr. How much she wants the baby to get if only formula fed. Reviewed not to BF longer than 15 minutes if she does put the baby to the breast at this time.   Plan: Use DEBP q3hr. Give back anything pumped to baby. If baby goes to the breast at this time, no longer than 15 minutes then supplement. Alert RN if notice any s/sx of hypoglycemia. Discuss w/Dr. How much to give if not BF.    Maternal Data Has patient been taught Hand Expression?: Yes  Feeding Nipple Type: Nfant Slow Flow (purple)  LATCH Score       Type of Nipple: Everted at rest and after stimulation            Lactation Tools Discussed/Used Tools: Pump Breast pump type: Double-Electric Breast Pump Reason for Pumping: LPI Pumping frequency: q3hr  Interventions Interventions:  Breast feeding basics reviewed;Skin to skin;Breast massage;Hand express;DEBP;LC Services brochure;LPT handout/interventions  Discharge WIC Program: Yes  Consult Status Consult Status: Follow-up Date: 06/13/22 Follow-up type: In-patient    Theodoro Kalata 06/12/2022, 10:43 PM

## 2022-06-12 NOTE — Anesthesia Postprocedure Evaluation (Signed)
Anesthesia Post Note  Patient: Colleen Arnold  Procedure(s) Performed: CESAREAN SECTION WITH BILATERAL TUBAL LIGATION     Patient location during evaluation: PACU Anesthesia Type: Spinal Level of consciousness: oriented and awake and alert Pain management: pain level controlled Vital Signs Assessment: post-procedure vital signs reviewed and stable Respiratory status: spontaneous breathing, respiratory function stable and patient connected to nasal cannula oxygen Cardiovascular status: blood pressure returned to baseline and stable Postop Assessment: no headache, no backache and no apparent nausea or vomiting Anesthetic complications: no   No notable events documented.  Last Vitals:  Vitals:   06/12/22 1640 06/12/22 2036  BP:  (!) 131/59  Pulse:  64  Resp: 18 18  Temp: 36.8 C 36.8 C  SpO2: 96% 99%    Last Pain:  Vitals:   06/12/22 2036  TempSrc: Oral  PainSc: 6                  Yobana Culliton

## 2022-06-12 NOTE — H&P (Signed)
Obstetric Preoperative History and Physical  Tabathia Knoche is a 36 y.o. 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  In review, [facility]  Pregnancy complicated by: -Class B DM  Currently on '1000mg'$  bid, glyburide '5mg'$  qhs  Taking Novolin qhs -Polyhydramnios and suspected macrosomia  -Last [place]@ 344w5dvertex/posterior/AFI 29, EFW: 4124g (99%) -Mild elevated LFTs last scompleted 3/14 -Desires sterilization  -Breast and bottle feeding  Denies any abnormal vaginal discharge, fevers, chills, sweats, dysuria, nausea, vomiting, other GI or GU symptoms or other general symptoms.  Reports good fetal movement, no bleeding, no contractions, no leaking of fluid.  No acute preoperative concerns.   Patient Active Problem List   Diagnosis Date Noted   Gestational diabetes 01/11/2022   Supervision of high-risk pregnancy 12/13/2021   History of gestational hypertension 10/19/2020   Class B DM 07/30/2020   Rubella non-immune status, antepartum 04/28/2020   Previous cesarean section 04/27/2020   Ovarian mass, left 10/06/2017   Biliary obstruction 10/06/2017   Elevated LFTs    Choledocholithiasis with obstruction     Prenatal labs and studies: ABO, Rh: --/--/O POS (08/27 065631Antibody: NEG (08/27 0620) Rubella: <0.90 (03/14 0858) RPR: Non Reactive (03/14 0858)  HBsAg: Negative (03/14 0858)  HIV: Non Reactive (03/14 0858)  GBSHF:WYOVZCH/ (08/24 1611) Anatomy US[place]ormal  Prenatal Transfer Tool  Maternal Diabetes: Yes:  Diabetes Type:  Insulin/Medication controlled Genetic Screening: Normal Maternal Ultrasounds/Referrals: Normal Fetal Ultrasounds or other Referrals:  None Maternal Substance Abuse:  No Significant Maternal Medications:  Meds include: Other:  metformin, glyburide, insulin Significant Maternal Lab Results: Other:  GBS pending  Past Medical History:   Diagnosis Date   Bile duct stone    Cholecystitis, acute with cholelithiasis    Cyst, ovarian    Elevated LFTs    Gestational diabetes    Pregnancy induced hypertension     Past Surgical History:  Procedure Laterality Date   CESAREAN SECTION     CESAREAN SECTION N/A 10/19/2020   Procedure: REPEAT CESAREAN SECTION;  Surgeon: EcClarnce FlockMD;  Location: [facility] LD ORS;  Service: Obstetrics;  Laterality: N/A;   CHOLECYSTECTOMY     ERCP N/A 10/06/2017   Procedure: ENDOSCOPIC RETROGRADE CHOLANGIOPANCREATOGRAPHY (ERCP);  Surgeon: GeGatha MayerMD;  [facility];  Service: Endoscopy;  Laterality: N/A;   LAPAROSCOPIC UNILATERAL SALPINGO OOPHERECTOMY Left 11/28/2017   Procedure: LAPAROSCOPIC LEFT SALPINGO OOPHORECTOMY ;  Surgeon: FeJonnie KindMD;  Location: [facility] ORS;  Service: Gynecology;  Laterality: Left;    OB History  Gravida Para Term Preterm AB Living  '3 2 2     2  '$ SAB IAB Ectopic Multiple Live Births        0 2    # Outcome Date GA Lbr Len/2nd Weight Sex Delivery Anes PTL Lv  3 Current           2 Term 10/19/20 [id number] g F CS-LTranv Spinal  LIV     Complications: Gestational diabetes  1 Term 07/19/08   3799 g F CS-LTranv  N LIV     Complications: Gestational diabetes    Obstetric Comments  #1- "big baby"- scheduled    Social History   Socioeconomic History   Marital status: Single    Spouse name: Not on file   Number of children: 1   Years of education: Not on file   Highest  education level: Not on file  Occupational History   Occupation: homemaker  Tobacco Use   Smoking status: Never   Smokeless tobacco: Never  Vaping Use   Vaping Use: Never used  Substance and Sexual Activity   Alcohol use: Not Currently   Drug use: No   Sexual activity: Yes    Birth control/protection: None  Other Topics Concern   Not on file  Social History Narrative   Not on file   Social Determinants of Health   Financial Resource Strain: High Risk  (12/27/2021)   Overall Financial Resource Strain (CARDIA)    Difficulty of Paying Living Expenses: Hard  Food Insecurity: No Food Insecurity (12/27/2021)   Hunger Vital Sign    Worried About Running Out of Food in the Last Year: Never true    Ran Out of Food in the Last Year: Never true  Transportation Needs: No Transportation Needs (12/27/2021)   PRAPARE - Hydrologist (Medical): No    Lack of Transportation (Non-Medical): No  Physical Activity: Insufficiently Active (12/27/2021)   Exercise Vital Sign    Days of Exercise per Week: 1 day    Minutes of Exercise per Session: 20 min  Stress: No Stress Concern Present (12/27/2021)   [place]    Feeling of Stress : Only a little  Social Connections: Moderately Integrated (12/27/2021)   Social Connection and Isolation Panel [NHANES]    Frequency of Communication with Friends and Family: More than three times a week    Frequency of Social Gatherings with Friends and Family: Once a week    Attends Religious Services: More than 4 times per year    Active Member of [employer] or Organizations: No    Attends Music therapist: Never    Marital Status: Living with partner    Family History  Problem Relation Age of Onset   Hyperlipidemia Mother    Diabetes Father    Hypertension Brother    Colon cancer Neg Hx     Medications Prior to Admission  Medication Sig Dispense Refill Last Dose   acetaminophen (TYLENOL) 500 MG tablet Take 500-1,000 mg by mouth every 6 (six) hours as needed (pain.).   Past Week   aspirin 81 MG EC tablet Take 2 tablets (162 mg total) by mouth daily. Swallow whole. 180 tablet 2 06/11/2022   glyBURIDE (DIABETA) 5 MG tablet Take 1 tablet (5 mg total) by mouth daily with supper. 30 tablet 1 Past Week   insulin NPH Human (NOVOLIN N) 100 UNIT/ML injection Inject 0.1 mLs (10 Units total) into the skin at bedtime. (Patient taking  differently: Inject 14 Units into the skin at bedtime.) 10 mL 3 06/11/2022   metFORMIN (GLUCOPHAGE) 1000 MG tablet 1 tablet in the morning, 1 tablet at bedtime 60 tablet 3 06/11/2022   pantoprazole (PROTONIX) 20 MG tablet Take 1 tablet (20 mg total) by mouth daily. 30 tablet 6 06/11/2022   prenatal vitamin w/FE, FA (PRENATAL 1 + 1) 27-1 MG TABS tablet Take 1 tablet by mouth daily at 12 noon. 90 tablet 3 06/11/2022   Accu-Chek Softclix Lancets lancets Check blood sugar four times a day. 100 each 12    glucose blood (ACCU-CHEK GUIDE) test strip Check blood sugar four times a day. 50 each 12    Insulin Syringe-Needle U-100 (INSULIN SYRINGE .5CC/30GX1/2") 30G X 1/2" 0.5 ML MISC 1 Units by Does not apply route at bedtime. 100 each  11     Allergies  Allergen Reactions   Rocephin [Ceftriaxone Sodium In Dextrose] Rash    Review of Systems: Pertinent items noted in HPI and remainder of comprehensive ROS otherwise negative.  Physical Exam: BP 122/69   Pulse 85   Temp 98.6 F (37 C) (Oral)   Resp 16   Ht '5\' 3"'$  (1.6 m)   Wt 123.4 kg   LMP 09/24/2021 (Exact Date)   SpO2 100%   BMI 48.18 kg/m  FHR by Doppler: 168 bpm CONSTITUTIONAL: Well-developed, well-nourished female in no acute distress.  HENT:  Normocephalic, atraumatic, External right and left ear normal. Oropharynx is clear and moist EYES: Conjunctivae and EOM are normal. Pupils are equal, round, and reactive to light. No scleral icterus.  NECK: Normal range of motion, supple, no masses SKIN: Skin is warm and dry. No rash noted. Not diaphoretic. No erythema. No pallor. [place]: Alert and oriented to person, place, and time. Normal reflexes, muscle tone coordination. No cranial nerve deficit noted. PSYCHIATRIC: Normal mood and affect. Normal behavior. Normal judgment and thought content. CARDIOVASCULAR: Normal heart rate noted, regular rhythm RESPIRATORY: Effort and breath sounds normal, no problems with respiration noted ABDOMEN: Soft,  nontender, nondistended, gravid. Well-healed Pfannenstiel incision. PELVIC: Deferred MUSCULOSKELETAL: Normal range of motion. No edema and no tenderness. 2+ distal pulses.  Pertinent Labs/Studies:   Results for orders placed or performed during the hospital encounter of 06/12/22 (from the past 72 hour(s))  Type and screen [place]     Status: None   Collection Time: 06/12/22  6:20 AM  Result Value Ref Range   ABO/RH(D) O POS    Antibody Screen NEG    Sample Expiration      06/15/2022,2359 Performed at [facility] [address] [place], [place] [address]   CBC     Status: Abnormal   Collection Time: 06/12/22  6:27 AM  Result Value Ref Range   WBC 7.5 4.0 - 10.5 K/uL   RBC 3.52 (L) 3.87 - 5.11 MIL/uL   Hemoglobin 11.5 (L) 12.0 - 15.0 g/dL   HCT 33.5 (L) 36.0 - 46.0 %   MCV 95.2 80.0 - 100.0 fL   MCH 32.7 26.0 - 34.0 pg   MCHC 34.3 30.0 - 36.0 g/dL   RDW 13.3 11.5 - 15.5 %   Platelets 194 150 - 400 K/uL   nRBC 0.0 0.0 - 0.2 %    Comment: Performed at [facility], [place] [address] [place], [place] [address]  Glucose, capillary     Status: Abnormal   Collection Time: 06/12/22  6:35 AM  Result Value Ref Range   Glucose-Capillary 104 (H) 70 - 99 mg/dL    Comment: Glucose reference range applies only to samples taken after fasting for at least 8 hours.    Assessment and Plan: Katryna Tschirhart is a 36 y.o. G3P2002 at 17w1dbeing admitted for scheduled cesarean section.  -NPO -Ancef 3g IV -LR @ 125cc/hr The risks of surgery were discussed with the patient including but were not limited to: bleeding which may require transfusion or reoperation; infection which may require antibiotics; injury to bowel, bladder, ureters or other surrounding organs; injury to the fetus; need for additional procedures including hysterectomy in the event of a life-threatening hemorrhage; formation of adhesions; placental abnormalities wth subsequent pregnancies; incisional  problems; thromboembolic phenomenon and other postoperative/anesthesia complications. The patient concurred with the proposed plan, giving informed written consent for the procedure. Patient has been NPO  since last night she will remain NPO for procedure. Anesthesia and OR aware. Preoperative prophylactic antibiotics and SCDs ordered on call to the OR. To OR when ready.   Otis Peak, DO [place], [facility] for [employer], [place]

## 2022-06-12 NOTE — Anesthesia Procedure Notes (Signed)
Spinal  Patient location during procedure: OR Start time: 06/12/2022 8:20 AM End time: 06/12/2022 8:25 AM Reason for block: surgical anesthesia Staffing Anesthesiologist: Janeece Riggers, MD Performed by: Janeece Riggers, MD Authorized by: Janeece Riggers, MD   Preanesthetic Checklist Completed: patient identified, IV checked, site marked, risks and benefits discussed, surgical consent, monitors and equipment checked, pre-op evaluation and timeout performed Spinal Block Patient position: sitting Prep: DuraPrep Patient monitoring: heart rate, cardiac monitor, continuous pulse ox and blood pressure Approach: midline Location: L3-4 Injection technique: single-shot Needle Needle type: Sprotte  Needle gauge: 24 G Needle length: 9 cm Assessment Sensory level: T4 Events: CSF return

## 2022-06-12 NOTE — Transfer of Care (Signed)
Immediate Anesthesia Transfer of Care Note  Patient: Colleen Arnold  Procedure(s) Performed: CESAREAN SECTION WITH BILATERAL TUBAL LIGATION  Patient Location: PACU  Anesthesia Type:Spinal  Level of Consciousness: awake, alert  and oriented  Airway & Oxygen Therapy: Patient Spontanous Breathing  Post-op Assessment: Report given to RN and Post -op Vital signs reviewed and stable  Post vital signs: Reviewed and stable  Last Vitals:  Vitals Value Taken Time  BP 129/77 06/12/22 1253  Temp 36.8 C 06/12/22 1640  Pulse 66 06/12/22 1253  Resp 18 06/12/22 1640  SpO2 96 % 06/12/22 1640    Last Pain:  Vitals:   06/12/22 1640  TempSrc: Oral  PainSc: 8       Patients Stated Pain Goal: 3 (32/34/68 8737)  Complications: No notable events documented.

## 2022-06-12 NOTE — Discharge Summary (Shared)
Postpartum Discharge Summary  Date of Service updated***     Patient Name: Colleen Arnold DOB: 24-Oct-1985 MRN: [id number]  Date of admission: 06/12/2022 Delivery date:06/12/2022  Delivering provider: Janyth Pupa  Date of discharge: 06/12/2022  Admitting diagnosis: Polyhydramnios [O40.9XX0], Uncontrolled GDMA2/Class B DM, Suspected Macrosomia, Prior C-section x 2, Obesity Intrauterine pregnancy: [redacted]w[redacted]d     Secondary diagnosis:  Principal Problem:   Polyhydramnios  Additional problems: Uncontrolled GDMA2/Class B DM, Suspected Macrosomia, Prior C-section x 2, Obesity    Discharge diagnosis: Term Pregnancy Delivered, GDM A2, and same as above                                               Post partum procedures:*** Augmentation: N/A Complications: None  Hospital course: Sceduled C/S   36 y.o. yo G3P2002 at [redacted]w[redacted]d was admitted to the hospital 06/12/2022 for scheduled cesarean section with the following indication: prior C-section x 2, suspected macrosomia, GDMA2 .Delivery details are as follows:  Membrane Rupture Time/Date: 8:51 AM ,06/12/2022   Delivery Method:C-Section, Low Transverse  Details of operation can be found in separate operative note.  Patient had an uncomplicated postpartum course.  She is ambulating, tolerating a regular diet, passing flatus, and urinating well. Patient is discharged home in stable condition on  06/12/22        Newborn Data: Birth date:06/12/2022  Birth time:8:50 AM  Gender:Female  Living status:Living  Apgars: ,  Weight:     Magnesium Sulfate received: No BMZ received: No Rhophylac:No MMR:N/A T-DaP: offered postpartum Flu: N/A Transfusion:{Transfusion received:30440034}  Physical exam  Vitals:   06/10/22 1206 06/12/22 0557 06/12/22 0942  BP:  122/69   Pulse:  85   Resp:  16   Temp:  98.6 F (37 C) (P) 99.1 F (37.3 C)  TempSrc:  Oral   SpO2:  100%   Weight: 123.4 kg    Height: $Remove'5\' 3"'LRiKgJB$  (1.6 m)     General: {Exam;  general:21111117} Lochia: {Desc; appropriate/inappropriate:30686::"appropriate"} Uterine Fundus: {Desc; firm/soft:30687} Incision: {Exam; incision:21111123} DVT Evaluation: {Exam; ZJI:9678938} Labs: Lab Results  Component Value Date   WBC 7.5 06/12/2022   HGB 11.5 (L) 06/12/2022   HCT 33.5 (L) 06/12/2022   MCV 95.2 06/12/2022   PLT 194 06/12/2022      Latest Ref Rng & Units 02/02/2022    9:14 AM  CMP  Glucose 70 - 99 mg/dL 124   BUN 6 - 20 mg/dL 9   Creatinine 0.44 - 1.00 mg/dL 0.45   Sodium 135 - 145 mmol/L 136   Potassium 3.5 - 5.1 mmol/L 3.6   Chloride 98 - 111 mmol/L 107   CO2 22 - 32 mmol/L 21   Calcium 8.9 - 10.3 mg/dL 8.5    Edinburgh Score:    11/23/2020    3:13 PM  Edinburgh Postnatal Depression Scale Screening Tool  I have been able to laugh and see the funny side of things. 0  I have looked forward with enjoyment to things. 0  I have blamed myself unnecessarily when things went wrong. 2  I have been anxious or worried for no good reason. 0  I have felt scared or panicky for no good reason. 1  Things have been getting on top of me. 1  I have been so unhappy that I have had difficulty sleeping. 0  I have felt sad or miserable.  0  I have been so unhappy that I have been crying. 0  The thought of harming myself has occurred to me. 0  Edinburgh Postnatal Depression Scale Total 4     After visit meds:  Allergies as of 06/12/2022       Reactions   Rocephin [ceftriaxone Sodium In Dextrose] Rash     Med Rec must be completed prior to using this [place]***        Discharge home in stable condition Infant Feeding: {Baby feeding:23562} Infant Disposition:{CHL IP OB HOME WITH AWNOPW:25615} Discharge instruction: per After Visit Summary and Postpartum booklet. Activity: Advance as tolerated. Pelvic rest for 6 weeks.  Diet: {OB KSYS:57334483} Future Appointments:No future appointments. Follow up Visit:   Please schedule this patient for a {Visit  type:23955} postpartum visit in {Postpartum visit:23953} with the following provider: {Provider type:23954}. Additional Postpartum F/U:{PP Procedure:23957}  {Risk IJFTZ:68957} pregnancy complicated by: {IYIYUWCNPSZJ:61254} Delivery mode:  C-Section, Low Transverse  Anticipated Birth Control:  {Birth Control:23956}   06/12/2022 Annalee Genta, DO

## 2022-06-13 LAB — CBC
HCT: 26.4 % — ABNORMAL LOW (ref 36.0–46.0)
Hemoglobin: 8.9 g/dL — ABNORMAL LOW (ref 12.0–15.0)
MCH: 31.9 pg (ref 26.0–34.0)
MCHC: 33.7 g/dL (ref 30.0–36.0)
MCV: 94.6 fL (ref 80.0–100.0)
Platelets: 146 10*3/uL — ABNORMAL LOW (ref 150–400)
RBC: 2.79 MIL/uL — ABNORMAL LOW (ref 3.87–5.11)
RDW: 13.2 % (ref 11.5–15.5)
WBC: 7 10*3/uL (ref 4.0–10.5)
nRBC: 0 % (ref 0.0–0.2)

## 2022-06-13 LAB — GLUCOSE, CAPILLARY
Glucose-Capillary: 92 mg/dL (ref 70–99)
Glucose-Capillary: 123 mg/dL — ABNORMAL HIGH (ref 70–99)
Glucose-Capillary: 153 mg/dL — ABNORMAL HIGH (ref 70–99)

## 2022-06-13 LAB — CULTURE, BETA STREP (GROUP B ONLY): Strep Gp B Culture: NEGATIVE

## 2022-06-13 LAB — CERVICOVAGINAL ANCILLARY ONLY
Chlamydia: NEGATIVE
Comment: NEGATIVE
Comment: NORMAL
Neisseria Gonorrhea: NEGATIVE

## 2022-06-13 MED ORDER — SODIUM CHLORIDE 0.9 % IV SOLN
500.0000 mg | Freq: Once | INTRAVENOUS | Status: AC
  Administered 2022-06-13: 500 mg via INTRAVENOUS
  Filled 2022-06-13: qty 25

## 2022-06-13 NOTE — Progress Notes (Addendum)
POSTPARTUM PROGRESS NOTE  POD #1  Subjective:  Colleen Arnold is a 36 y.o. [id number] s/p rLTCS at [redacted]w[redacted]d  She reports she doing well. No acute events overnight. She reports she is doing well but very tired and feeling light headed when sitting up on the side of the bed. She denies any problems with ambulating, voiding or po intake. Denies nausea or vomiting. She has not passed flatus. Pain is well controlled.  Lochia is minimal.  Objective: Blood pressure 114/69, pulse 69, temperature (!) 97.2 F (36.2 C), temperature source Oral, resp. rate 16, height '5\' 3"'$  (1.6 m), weight 123.4 kg, last menstrual period 09/24/2021, SpO2 99 %, unknown if currently breastfeeding.  Physical Exam:  General: alert, cooperative and no distress Chest: no respiratory distress Heart:regular rate, distal pulses intact Abdomen: soft, nontender,  Uterine Fundus: firm, appropriately tender DVT Evaluation: No calf swelling or tenderness Extremities: No LE edema Skin: warm, dry; incision clean/dry/intact w/ honeycomb dressing in place  Recent Labs    06/12/22 0627 06/13/22 0410  HGB 11.5* 8.9*  HCT 33.5* 26.4*    Assessment/Plan: Colleen Telladois a 36y.o. [id number]/p rLTCS at 311w1dor GDM, poly.  POD#1 - Doing well; pain well controlled. H/H almost 3 pt drop  Routine postpartum care  OOB, ambulated  Lovenox for VTE prophylaxis Anemia: symptomatic. IV venofer x1 Contraception: tubal Feeding: Br/Bo  Dispo: Plan for discharge 8/29.   LOS: 1 day   Colleen Arnold 06/13/2022, 5:34 AM PGY-3, [employer]

## 2022-06-13 NOTE — Lactation Note (Signed)
This note was copied from a baby's chart. Lactation Consultation Note  Patient Name: Colleen Arnold UORVI'F Date: 06/13/2022 Reason for consult: Follow-up assessment;Mother's request;Difficult latch;Late-preterm 34-36.6wks;Breastfeeding assistance;Maternal endocrine disorder;Other (Comment);Exclusive pumping and bottle feeding (PIH) Age:36 years  Birth parent interested in pumping for now. LC adjusted flange size and increase setting. Birth parent denied pain once we adjusted settings and colostrum seen on both breasts.   LPTI guidelines to reduce calorie loss reviewed.   Plan 1. To feed based on cues 8-12x 24hr period.  2. Birth parent to offer EBM first followed by formula with pace bottle feeding using Nfant nipple. Birth parent to advance volumes as tolerated.  3 Post pump after each feeding for 15 mins.   Birth parent does not have a pump for home.  [facility] referral sent.  All questions answered at the end of the visit.   Maternal Data Has patient been taught Hand Expression?: Yes  Feeding Mother's Current Feeding Choice: Breast Milk and Formula Nipple Type: Nfant Slow Flow (purple)  LATCH Score                    Lactation Tools Discussed/Used Tools: Pump;Flanges Flange Size: 27 Breast pump type: Double-Electric Breast Pump Pump Education: Setup, frequency, and cleaning;Milk Storage Reason for Pumping: increase stimulation Pumping frequency: post pump after each feeding for 15 mins  Interventions Interventions: Breast feeding basics reviewed;Pre-pump if needed;Expressed milk;Education;Pace feeding;Infant Driven Feeding Algorithm education;LPT handout/interventions  Discharge Pump: DEBP WIC Program: Yes  Consult Status Consult Status: Follow-up Date: 06/14/22 Follow-up type: In-patient    Colleen Segoviano  Arnold 06/13/2022, 8:29 PM

## 2022-06-13 NOTE — Social Work (Addendum)
CSW received consult for Wishes to Apply for Emergency Medicaid. CSW met with MOB to offer support and complete assessment.   CSW offered to use interpreter, MOB declined and reported "I can understand."  CSW reached out to [facility] financial counselor Juanita and notified her of MOB request.   CSW met with MOB at bedside and MOB confirmed that the financial counselor had been by the room and followed up with her.   CSW assessed MOB for additional needs. MOB reported that she has essential items to care for the infant including a pack n play where the infant sleep.She expressed that she can use additional diapers and wipes. MOB reported that she receives [facility]. CSW informed MOB about [employer] and offered to make a referral. MOB gave CSW permission to make a referral. CSW provided review of Sudden Infant Death Syndrome (SIDS) precautions. MOB reported understanding.   CSW identifies no further need for intervention and no barriers to discharge at this time.   Kathrin Greathouse, MSW, LCSW Women's and [place] Worker  [phone] 06/13/2022  12:40 PM

## 2022-06-14 LAB — GLUCOSE, CAPILLARY
Glucose-Capillary: 103 mg/dL — ABNORMAL HIGH (ref 70–99)
Glucose-Capillary: 118 mg/dL — ABNORMAL HIGH (ref 70–99)

## 2022-06-14 LAB — SURGICAL PATHOLOGY

## 2022-06-14 MED ORDER — OXYCODONE HCL 5 MG PO TABS: 5.0000 mg | ORAL_TABLET | ORAL | 0 refills | Status: DC | PRN

## 2022-06-14 MED ORDER — SENNOSIDES-DOCUSATE SODIUM 8.6-50 MG PO TABS: 2.0000 | ORAL_TABLET | Freq: Two times a day (BID) | ORAL | 0 refills | Status: DC

## 2022-06-14 MED ORDER — IBUPROFEN 600 MG PO TABS: 600.0000 mg | ORAL_TABLET | Freq: Four times a day (QID) | ORAL | 0 refills | Status: DC

## 2022-06-14 MED ORDER — ACETAMINOPHEN 325 MG PO TABS: 650.0000 mg | ORAL_TABLET | ORAL | 0 refills | Status: AC | PRN

## 2022-06-14 NOTE — Lactation Note (Signed)
This note was copied from a baby's chart. Lactation Consultation Note  Patient Name: Colleen Arnold FSFSE'L Date: 06/14/2022 Reason for consult: Follow-up assessment;Difficult latch;1st time breastfeeding;Late-preterm 34-36.6wks;Maternal endocrine disorder;Breastfeeding assistance Age:35 hours  Visited with mom of 25 hours old LPI female, MBU RN Clarise Cruz requested assistance with breastfeeding. MOB pumping when entered the room, praised her for her efforts. LC woke baby up and took him to mother's right breast in cross cradle hold at first and then to the left on in football, but baby would not sustain the latch, breast are also spaced out (see LATCH score). Family is getting discharged today. Reviewed discharge education, lactogenesis II, anticipatory guidelines and supplementation amounts, per updated LPI policy baby will be having 35 ml per feeding, parents agreeable. They also requested SLP to get more bottle nipples and a can of formula since they won't have their [facility] appt until September, SLP notified. Encouraged to continue pumping whenever baby is getting formula, at least 8 times/24 hours. Advised to F/U with LC OP if she would like to take baby to breast again, MOB undecided, she voiced she will at least pump and supplement with formula. All questions and concerns answered, family to contact [facility] services PRN.  Feeding Mother's Current Feeding Choice: Breast Milk and Formula  LATCH Score Latch: Repeated attempts needed to sustain latch, nipple held in mouth throughout feeding, stimulation needed to elicit sucking reflex. (only a few sucks, baby fell asleep at the breast and required stimulation to wake up again to have supplementation)  Audible Swallowing: None  Type of Nipple: Flat (tissue is non-compressible, not a candidate for a NS at this points)  Comfort (Breast/Nipple): Soft / non-tender  Hold (Positioning): Assistance needed to correctly position infant at breast and maintain  latch.  LATCH Score: 5  Lactation Tools Discussed/Used Tools: Pump;Flanges Flange Size: 27 Breast pump type: Double-Electric Breast Pump Pump Education: Setup, frequency, and cleaning;Milk Storage Reason for Pumping: LPI, induction of lactation, mother's request to pump and bottle feed Pumping frequency: whenever baby is getting a bottle, pump for at least 15 minutes Pumped volume:  (drops)  Interventions Interventions: Breast feeding basics reviewed;Assisted with latch;Skin to skin;Breast massage;Hand express;Breast compression;Adjust position;Support pillows;DEBP;Education  Discharge Discharge Education: Engorgement and breast care;Warning signs for feeding baby;Outpatient recommendation  Consult Status Consult Status: Complete Date: 06/14/22 Follow-up type: Call as needed   [place] 06/14/2022, 11:37 AM

## 2022-06-16 ENCOUNTER — Other Ambulatory Visit: Payer: Self-pay

## 2022-06-16 ENCOUNTER — Encounter: Payer: Self-pay | Admitting: Obstetrics & Gynecology

## 2022-06-17 ENCOUNTER — Encounter: Payer: Self-pay | Admitting: Obstetrics & Gynecology

## 2022-06-17 ENCOUNTER — Ambulatory Visit (INDEPENDENT_AMBULATORY_CARE_PROVIDER_SITE_OTHER): Payer: Self-pay | Admitting: Obstetrics & Gynecology

## 2022-06-17 VITALS — BP 138/77 | HR 53 | Wt 257.8 lb

## 2022-06-17 DIAGNOSIS — Z09 Encounter for follow-up examination after completed treatment for conditions other than malignant neoplasm: Secondary | ICD-10-CM

## 2022-06-17 NOTE — Progress Notes (Signed)
    PostOp Visit Note  Colleen Arnold is a 36 y.o. [phone] female who presents for a postoperative visit. She is 1 week postop following a repeat C-section and salpingectomy completed on 06/12/22   Today she notes that she was afraid to take off the bandage.  They saw some blood on it and were concerned.  Denies fever or chills.  Tolerating gen diet.  +Flatus, Notes some constipation- taking OTC medication with improvement  Pain is controlled with current medication. Overall doing well and reports no acute complaints   Review of Systems Pertinent items are noted in HPI.    Objective:  LMP 09/24/2021 (Exact Date)    Physical Examination:  GENERAL ASSESSMENT: well developed and well nourished SKIN: normal color, no lesions CHEST: normal air exchange, respiratory effort normal with no retractions HEART: regular rate and rhythm ABDOMEN: obese, soft, non-distended INCISION: honeycomb removed- C/D/I with dermabon EXTREMITY: minimal edema, no calf tenderness bilaterally PSYCH: mood appropriate, normal affect       Assessment:    Postop incision check   Plan:   -reassured pt that incision healing appropriately  -reviewed care of incision -f/u as scheduled  Janyth Pupa, DO Attending [place], [place] for [employer], [place]

## 2022-06-21 ENCOUNTER — Telehealth (HOSPITAL_COMMUNITY): Payer: Self-pay | Admitting: *Deleted

## 2022-06-21 NOTE — Telephone Encounter (Signed)
Opened chart in error.  Odis Hollingshead, RN 06-21-2022 at 10:01am

## 2022-06-23 ENCOUNTER — Other Ambulatory Visit: Payer: Self-pay

## 2022-06-23 ENCOUNTER — Encounter: Payer: Self-pay | Admitting: Obstetrics & Gynecology

## 2022-06-27 ENCOUNTER — Encounter: Payer: Self-pay | Admitting: Women's Health

## 2022-06-30 ENCOUNTER — Encounter: Payer: Self-pay | Admitting: Advanced Practice Midwife

## 2022-06-30 ENCOUNTER — Other Ambulatory Visit: Payer: Self-pay

## 2022-06-30 ENCOUNTER — Encounter: Payer: Self-pay | Admitting: Obstetrics & Gynecology

## 2022-06-30 ENCOUNTER — Ambulatory Visit (INDEPENDENT_AMBULATORY_CARE_PROVIDER_SITE_OTHER): Payer: Self-pay | Admitting: Advanced Practice Midwife

## 2022-06-30 VITALS — BP 137/81 | HR 58 | Ht 63.0 in | Wt 242.0 lb

## 2022-06-30 DIAGNOSIS — Z09 Encounter for follow-up examination after completed treatment for conditions other than malignant neoplasm: Secondary | ICD-10-CM

## 2022-06-30 DIAGNOSIS — O24319 Unspecified pre-existing diabetes mellitus in pregnancy, unspecified trimester: Secondary | ICD-10-CM

## 2022-06-30 DIAGNOSIS — D509 Iron deficiency anemia, unspecified: Secondary | ICD-10-CM

## 2022-06-30 LAB — POCT HEMOGLOBIN: Hemoglobin: 12.1 g/dL (ref 11–14.6)

## 2022-06-30 NOTE — Progress Notes (Signed)
  HPI: Patient returns for routine postoperative follow-up having undergone RLTCS/BSO  on 06/12/22.  The patient's immediate postoperative recovery has been unremarkable. Since hospital discharge the patient reports no C/O.  Still taking metformin '500mg'$  BID, when she checks BS, usually ~ 120-130 after meals. Has never been low.   Current Outpatient Medications: acetaminophen (TYLENOL) 325 MG tablet, Take 2 tablets (650 mg total) by mouth every 4 (four) hours as needed for mild pain (temperature > 101.5.)., Disp: 30 tablet, Rfl: 0 ibuprofen (ADVIL) 600 MG tablet, Take 1 tablet (600 mg total) by mouth every 6 (six) hours., Disp: 30 tablet, Rfl: 0 metFORMIN (GLUCOPHAGE) 1000 MG tablet, 1 tablet in the morning, 1 tablet at bedtime, Disp: 60 tablet, Rfl: 3 prenatal vitamin w/FE, FA (PRENATAL 1 + 1) 27-1 MG TABS tablet, Take 1 tablet by mouth daily at 12 noon., Disp: 90 tablet, Rfl: 3 senna-docusate (SENOKOT-S) 8.6-50 MG tablet, Take 2 tablets by mouth 2 (two) times daily., Disp: 60 tablet, Rfl: 0 Accu-Chek Softclix Lancets lancets, Check blood sugar four times a day. (Patient not taking: Reported on 06/17/2022), Disp: 100 each, Rfl: 12 glucose blood (ACCU-CHEK GUIDE) test strip, Check blood sugar four times a day. (Patient not taking: Reported on 06/17/2022), Disp: 50 each, Rfl: 12 oxyCODONE (OXY IR/ROXICODONE) 5 MG immediate release tablet, Take 1-2 tablets (5-10 mg total) by mouth every 4 (four) hours as needed for moderate pain. (Patient not taking: Reported on 06/30/2022), Disp: 30 tablet, Rfl: 0 pantoprazole (PROTONIX) 20 MG tablet, Take 1 tablet (20 mg total) by mouth daily. (Patient not taking: Reported on 06/17/2022), Disp: 30 tablet, Rfl: 6  No current facility-administered medications for this visit.    Blood pressure 137/81, pulse (!) 58, height '5\' 3"'$  (1.6 m), weight 242 lb (109.8 kg), currently breastfeeding.  Physical Exam: Incision C/D w/o erythema, drainage or odor. Small area of  granulation tissue treated w/silver nitrate  Diagnostic Tests: none   Impression:  3 weeks s/p CS, incision looks good  (D50.9) Iron deficiency anemia, unspecified iron deficiency anemia type  (primary encounter diagnosis) Plan: POCT hemoglobin 12.1     Plan: Continue metformin Will need to go to HD (PCP) for DM management after the postpartum period.    Follow up: 10/2 for ppcheck up   Christin Fudge, CNM

## 2022-07-07 ENCOUNTER — Encounter: Payer: Self-pay | Admitting: Obstetrics & Gynecology

## 2022-07-07 ENCOUNTER — Other Ambulatory Visit: Payer: Self-pay

## 2022-07-18 ENCOUNTER — Ambulatory Visit: Payer: Self-pay | Admitting: Women's Health

## 2022-07-20 ENCOUNTER — Ambulatory Visit (INDEPENDENT_AMBULATORY_CARE_PROVIDER_SITE_OTHER): Payer: Self-pay | Admitting: Advanced Practice Midwife

## 2022-07-20 ENCOUNTER — Encounter: Payer: Self-pay | Admitting: Advanced Practice Midwife

## 2022-07-20 NOTE — Progress Notes (Signed)
POSTPARTUM VISIT- visit with in person Spanish interpreter Patient name: Colleen Arnold MRN [id number]  Date of birth: 02/08/1986 Chief Complaint:   Postpartum Care  History of Present Illness:   Colleen Arnold is a 36 y.o. [phone] Hispanic female being seen today for a postpartum visit. She is 5 weeks postpartum following a repeat cesarean section, low transverse incision at 36.1 gestational weeks. IOL: n/a, scheduled for Class B DM, macrosomia, uncontrolled, polyhydramnios. Anesthesia: spinal.  Laceration: n/a.  Complications: none. Inpatient contraception: yes , had prior L S/O, now R salpingectomy w C/S .   Pregnancy complicated by Class B DM (uncontrolled), previous C/S, previous L S/O; desires sterilization, polyhydramnios . Tobacco use: no. Substance use disorder: no. Last pap smear: Sept 2022 and results were NILM w/ HRHPV negative. Next pap smear due: Sept 2025 No LMP recorded.  Postpartum course has been uncomplicated. Bleeding flow about like a period. Bowel function is  some constipation, minor . Bladder function is normal. Urinary incontinence? no, fecal incontinence? no Patient is not sexually active. Last sexual activity: prior to birth of baby. Desired contraception:  salpingectomy . Patient does not want a pregnancy in the future.  Desired family size is 3 children.   Upstream - 07/20/22 0913       Pregnancy Intention Screening   Does the patient want to become pregnant in the next year? No    Does the patient's partner want to become pregnant in the next year? No    Would the patient like to discuss contraceptive options today? N/A      Contraception Wrap Up   Current Method Female Sterilization    End Method Female Sterilization    Contraception Counseling Provided No            The pregnancy intention screening data noted above was reviewed. Potential methods of contraception were discussed. The patient elected to proceed with Female Sterilization.  Edinburgh  Postpartum Depression Screening: positive: H/O mental health disorder: no. Currently on meds: no.  Currently in therapy: no.  Sleeping: as expected w newborn.  Appetite: nl.  Still finds joy in things she used to: Yes.  Support at home: yes.  SI/HI/II: no.  Interested in medicine: No.  Interested in therapy: No.  Edinburgh Postnatal Depression Scale - 07/20/22 0915       Edinburgh Postnatal Depression Scale:  In the Past 7 Days   I have been able to laugh and see the funny side of things. 0    I have looked forward with enjoyment to things. 0    I have blamed myself unnecessarily when things went wrong. 2    I have been anxious or worried for no good reason. 1    I have felt scared or panicky for no good reason. 1    Things have been getting on top of me. 1    I have been so unhappy that I have had difficulty sleeping. 1    I have felt sad or miserable. 1    I have been so unhappy that I have been crying. 0    The thought of harming myself has occurred to me. 0    Edinburgh Postnatal Depression Scale Total 7                12/27/2021    3:45 PM 07/27/2020    9:29 AM 04/27/2020   10:35 AM  GAD 7 : Generalized Anxiety Score  Nervous, Anxious, on Edge 0 0 0  Control/stop worrying 0 1 0  Worry too much - different things 0 1 0  Trouble relaxing 0 0 0  Restless 0 0 0  Easily annoyed or irritable 0 0 0  Afraid - awful might happen 0 1 0  Total GAD 7 Score 0 3 0     Baby's course has been complicated by feeding concerns, ?stricture . Baby is feeding by bottle. Infant has a pediatrician/family doctor? Yes.  Childcare strategy if returning to work/school: n/a-stay at home mom.  Pt has material needs met for her and baby: Yes.   Review of Systems:   Pertinent items are noted in HPI Denies Abnormal vaginal discharge w/ itching/odor/irritation, headaches, visual changes, shortness of breath, chest pain, abdominal pain, severe nausea/vomiting, or problems with urination or bowel  movements. Pertinent History Reviewed:  Reviewed past medical,surgical, obstetrical and family history.  Reviewed problem list, medications and allergies. OB History  Gravida Para Term Preterm AB Living  _0 SAB IAB Ectopic Multiple Live Births        0 3    # Outcome Date GA Lbr Len/2nd Weight Sex Delivery Anes PTL Lv  3 Preterm 06/12/22 [redacted]w[redacted]d 7 lb 13.6 oz (3.56 kg) M CS-LTranv Spinal  LIV  2 Term 10/19/20 [id number] lb 2.7 oz (4.159 kg) F CS-LTranv Spinal  LIV     Complications: Gestational diabetes  1 Term 07/19/08   8 lb 6 oz (3.799 kg) F CS-LTranv  N LIV     Complications: Gestational diabetes    Obstetric Comments  #1- "big baby"- scheduled   Physical Assessment:   Vitals:   07/20/22 0911  BP: 113/70  Pulse: (!) 53  Weight: 246 lb (111.6 kg)  Height: _1  (1.6 m)  Body mass index is 43.58 kg/m.       Physical Examination:   General appearance: alert, well appearing, and in no distress  Mental status: alert, oriented to person, place, and time  Skin: warm & dry   Cardiovascular: normal heart rate noted   Respiratory: normal respiratory effort, no distress   Breasts: deferred, no complaints   Abdomen: soft, non-tender   Pelvic: examination not indicated. Thin prep pap obtained: No  Rectal: not examined  Extremities: Edema: Trace        No results found for this or any previous visit (from the past 24 hour(s)).  Assessment & Plan:  1) Postpartum exam 2) Five wks s/p repeat cesarean section, low transverse incision 3) bottle feeding 4) Depression screening: mild positive; has supports; declines meds/therapy now 5) Contraception: s/p R salpingectomy (had previous L salpingectomy) 6) Class B DM; not taking Metformin, getting fasting values in the 120s; recommend restart Metformin 50010mid and after 1 week if fasting values aren't in the 90s or less, increase to 1000m62md; make appt for HD at the end of Nov  Essential components of care per ACOG  recommendations:  1.  Mood and well being:  If positive depression screen, discussed and plan developed.  If using tobacco we discussed reduction/cessation and risk of relapse If current substance abuse, we discussed and referral to local resources was offered.   2. Infant care and feeding:  If breastfeeding, discussed returning to work, pumping, breastfeeding-associated pain, guidance regarding return to fertility while lactating if not using another method. If needed, patient was provided with a letter to be allowed to pump q 2-3hrs to support lactation in a private location with  access to a refrigerator to store breastmilk.   Recommended that all caregivers be immunized for flu, pertussis and other preventable communicable diseases If pt does not have material needs met for her/baby, referred to local resources for help obtaining these.  3. Sexuality, contraception and birth spacing Provided guidance regarding sexuality, management of dyspareunia, and resumption of intercourse Discussed avoiding interpregnancy interval <40mhs and recommended birth spacing of 18 months  4. Sleep and fatigue Discussed coping options for fatigue and sleep disruption Encouraged family/partner/community support of 4 hrs of uninterrupted sleep to help with mood and fatigue  5. Physical recovery  If pt had a C/S, assessed incisional pain and providing guidance on normal vs prolonged recovery If pt had a laceration, perineal healing and pain reviewed.  If urinary or fecal incontinence, discussed management and referred to PT or uro/gyn if indicated  Patient is safe to resume physical activity. Discussed attainment of healthy weight.  6.  Chronic disease management Discussed pregnancy complications if any, and their implications for future childbearing and long-term maternal health. Review recommendations for prevention of recurrent pregnancy complications, such as 17 hydroxyprogesterone caproate to reduce risk  for recurrent PTB not applicable, or aspirin to reduce risk of preeclampsia not applicable. Pt had GDM: Class B DM. If yes, 2hr GTT scheduled: not applicable. Reviewed medications and non-pregnant dosing including consideration of whether pt is breastfeeding using a reliable resource such as LactMed: not applicable Referred for f/u w/ PCP or subspecialist providers as indicated: yes, plans to see HD for DM management @ 359monthPP  7. Health maintenance Mammogram at [id number] earlier if indicated Pap smears as indicated  Meds: No orders of the defined types were placed in this encounter.   Follow-up: Return in about 1 year (around 07/21/2023) for Physical.   No orders of the defined types were placed in this encounter.   KiMyrtis SerNM 07/20/2022 10:48 AM

## 2022-10-11 ENCOUNTER — Other Ambulatory Visit: Payer: Self-pay

## 2022-10-11 ENCOUNTER — Emergency Department (HOSPITAL_COMMUNITY)
Admission: EM | Admit: 2022-10-11 | Discharge: 2022-10-11 | Disposition: A | Discharge status: Home / Self Care | Payer: Self-pay | Attending: Emergency Medicine | Admitting: Emergency Medicine

## 2022-10-11 ENCOUNTER — Encounter (HOSPITAL_COMMUNITY): Payer: Self-pay | Admitting: *Deleted

## 2022-10-11 DIAGNOSIS — N644 Mastodynia: Secondary | ICD-10-CM | POA: Insufficient documentation

## 2022-10-11 LAB — CBC WITH DIFFERENTIAL/PLATELET
Abs Immature Granulocytes: 0.03 10*3/uL (ref 0.00–0.07)
Basophils Absolute: 0 10*3/uL (ref 0.0–0.1)
Basophils Relative: 1 %
Eosinophils Absolute: 0.1 10*3/uL (ref 0.0–0.5)
Eosinophils Relative: 2 %
HCT: 40.4 % (ref 36.0–46.0)
Hemoglobin: 13.6 g/dL (ref 12.0–15.0)
Immature Granulocytes: 0 %
Lymphocytes Relative: 37 %
Lymphs Abs: 2.6 10*3/uL (ref 0.7–4.0)
MCH: 30.4 pg (ref 26.0–34.0)
MCHC: 33.7 g/dL (ref 30.0–36.0)
MCV: 90.2 fL (ref 80.0–100.0)
Monocytes Absolute: 0.6 10*3/uL (ref 0.1–1.0)
Monocytes Relative: 8 %
Neutro Abs: 3.8 10*3/uL (ref 1.7–7.7)
Neutrophils Relative %: 52 %
Platelets: 206 10*3/uL (ref 150–400)
RBC: 4.48 MIL/uL (ref 3.87–5.11)
RDW: 12.8 % (ref 11.5–15.5)
WBC: 7.2 10*3/uL (ref 4.0–10.5)
nRBC: 0 % (ref 0.0–0.2)

## 2022-10-11 LAB — BASIC METABOLIC PANEL
Anion gap: 5 (ref 5–15)
BUN: 19 mg/dL (ref 6–20)
CO2: 23 mmol/L (ref 22–32)
Calcium: 8.8 mg/dL — ABNORMAL LOW (ref 8.9–10.3)
Chloride: 108 mmol/L (ref 98–111)
Creatinine, Ser: 0.48 mg/dL (ref 0.44–1.00)
GFR, Estimated: >60 mL/min (ref >60)
Glucose, Bld: 130 mg/dL — ABNORMAL HIGH (ref 70–99)
Potassium: 3.9 mmol/L (ref 3.5–5.1)
Sodium: 136 mmol/L (ref 135–145)

## 2022-10-11 MED ORDER — DICLOFENAC SODIUM 75 MG PO TBEC: 75.0000 mg | DELAYED_RELEASE_TABLET | Freq: Two times a day (BID) | ORAL | 0 refills | Status: DC

## 2022-10-11 MED ORDER — DOXYCYCLINE HYCLATE 100 MG PO CAPS: 100.0000 mg | ORAL_CAPSULE | Freq: Two times a day (BID) | ORAL | 0 refills | Status: DC

## 2022-10-11 NOTE — ED Provider Notes (Signed)
[facility] Provider Note   CSN: [id number] Arrival date & time: 10/11/22  6295     History  Chief Complaint  Patient presents with   Breast Pain    Colleen Arnold is a 36 y.o. female.  Ot complains of left breast soreness.  Pt reports discomfort to palpation.  Pt is concerned that she needs a mamogram.  Pt denies fever or chills.  No fever.   The history is provided by the patient. No language interpreter was used.       Home Medications Prior to Admission medications   Medication Sig Start Date End Date Taking? Authorizing Provider  diclofenac (VOLTAREN) 75 MG EC tablet Take 1 tablet (75 mg total) by mouth 2 (two) times daily. 10/11/22  Yes Caryl Ada K, PA-C  doxycycline (VIBRAMYCIN) 100 MG capsule Take 1 capsule (100 mg total) by mouth 2 (two) times daily. 10/11/22  Yes Caryl Ada K, PA-C  Accu-Chek Softclix Lancets lancets Check blood sugar four times a day. 01/11/22   Roma Schanz, CNM  acetaminophen (TYLENOL) 325 MG tablet Take 2 tablets (650 mg total) by mouth every 4 (four) hours as needed for mild pain (temperature > 101.5.). 06/14/22   Cashion, Colter L, MD  glucose blood (ACCU-CHEK GUIDE) test strip Check blood sugar four times a day. 01/11/22   Roma Schanz, CNM  ibuprofen (ADVIL) 600 MG tablet Take 1 tablet (600 mg total) by mouth every 6 (six) hours. Patient not taking: Reported on 07/20/2022 06/14/22   Cashion, Colter L, MD  metFORMIN (GLUCOPHAGE) 1000 MG tablet 1 tablet in the morning, 1 tablet at bedtime 03/28/22   Roma Schanz, CNM  oxyCODONE (OXY IR/ROXICODONE) 5 MG immediate release tablet Take 1-2 tablets (5-10 mg total) by mouth every 4 (four) hours as needed for moderate pain. Patient not taking: Reported on 06/30/2022 06/14/22   Mercado-Ortiz, Ernestine Conrad, DO  pantoprazole (PROTONIX) 20 MG tablet Take 1 tablet (20 mg total) by mouth daily. 12/27/21   Roma Schanz, CNM  prenatal vitamin w/FE, FA (PRENATAL 1 + 1)  27-1 MG TABS tablet Take 1 tablet by mouth daily at 12 noon. 08/09/21   Roma Schanz, CNM  senna-docusate (SENOKOT-S) 8.6-50 MG tablet Take 2 tablets by mouth 2 (two) times daily. 06/14/22   Cashion, Colter L, MD      Allergies    Rocephin [ceftriaxone sodium in dextrose]    Review of Systems   Review of Systems  Skin:  Negative for wound.  All other systems reviewed and are negative.   Physical Exam Updated Vital Signs BP 132/80 (BP Location: Left Wrist)   Pulse 64   Temp 98.2 F (36.8 C) (Oral)   Resp 17   Ht '5\' 3"'$  (1.6 m)   Wt 124.7 kg   LMP 09/22/2022 (Exact Date)   SpO2 98%   BMI 48.71 kg/m  Physical Exam Vitals and nursing note reviewed.  Constitutional:      Appearance: She is well-developed.  HENT:     Head: Normocephalic.  Cardiovascular:     Rate and Rhythm: Normal rate.     Comments: Left breast  no obvious mass,  no abscess,  slight erythema left medial breast.   Pulmonary:     Effort: Pulmonary effort is normal.  Abdominal:     General: Abdomen is flat. There is no distension.  Musculoskeletal:     Cervical back: Normal range of motion.  Skin:    General: Skin is  warm.  Neurological:     General: No focal deficit present.     Mental Status: She is alert and oriented to person, place, and time.  Psychiatric:        Mood and Affect: Mood normal.     ED Results / Procedures / Treatments   Labs (all labs ordered are listed, but only abnormal results are displayed) Labs Reviewed  BASIC METABOLIC PANEL - Abnormal; Notable for the following components:      Result Value   Glucose, Bld 130 (*)    Calcium 8.8 (*)    All other components within normal limits  CBC WITH DIFFERENTIAL/PLATELET    EKG None  Radiology No results found.  Procedures Procedures    Medications Ordered in ED Medications - No data to display  ED Course/ Medical Decision Making/ A&P                           Medical Decision Making Pt complains of pain in her  left breast.    Amount and/or Complexity of Data Reviewed Labs: ordered. Decision-making details documented in ED Course.    Details: Glucose is 130.    Risk Prescription drug management. Risk Details: Pt given rx for doxycycline and voltaren.  Pt advised she needs to see her primary MD to schedule Breast ultrasound.             Final Clinical Impression(s) / ED Diagnoses Final diagnoses:  Breast pain, left    Rx / DC Orders ED Discharge Orders          Ordered    doxycycline (VIBRAMYCIN) 100 MG capsule  2 times daily        10/11/22 1031    diclofenac (VOLTAREN) 75 MG EC tablet  2 times daily        10/11/22 1114           An After Visit Summary was printed and given to the patient.    Fransico Meadow, [place] 10/11/22 1607    Tretha Sciara, MD 10/12/22 [phone]

## 2022-10-11 NOTE — ED Triage Notes (Signed)
Pt c/o left breast pain especially around nipple area; the pain started x 3 days ago

## 2022-10-11 NOTE — Discharge Instructions (Addendum)
Return if any problems.

## 2023-01-24 ENCOUNTER — Ambulatory Visit: Payer: Self-pay | Admitting: Adult Health

## 2023-03-03 ENCOUNTER — Emergency Department (HOSPITAL_COMMUNITY): Payer: Self-pay

## 2023-03-03 ENCOUNTER — Emergency Department (HOSPITAL_COMMUNITY)
Admission: EM | Admit: 2023-03-03 | Discharge: 2023-03-03 | Disposition: A | Discharge status: Home / Self Care | Payer: Self-pay | Attending: Emergency Medicine | Admitting: Emergency Medicine

## 2023-03-03 ENCOUNTER — Other Ambulatory Visit: Payer: Self-pay

## 2023-03-03 DIAGNOSIS — Z1152 Encounter for screening for COVID-19: Secondary | ICD-10-CM | POA: Insufficient documentation

## 2023-03-03 DIAGNOSIS — J029 Acute pharyngitis, unspecified: Secondary | ICD-10-CM | POA: Insufficient documentation

## 2023-03-03 DIAGNOSIS — R059 Cough, unspecified: Secondary | ICD-10-CM

## 2023-03-03 LAB — SARS CORONAVIRUS 2 BY RT PCR: SARS Coronavirus 2 by RT PCR: NEGATIVE

## 2023-03-03 LAB — GROUP A STREP BY PCR: Group A Strep by PCR: NOT DETECTED

## 2023-03-03 MED ORDER — BENZONATATE 100 MG PO CAPS: 100.0000 mg | ORAL_CAPSULE | Freq: Three times a day (TID) | ORAL | 0 refills | Status: DC

## 2023-03-03 MED ORDER — IBUPROFEN 800 MG PO TABS
800.0000 mg | ORAL_TABLET | Freq: Once | ORAL | Status: AC
  Administered 2023-03-03: 800 mg via ORAL
  Filled 2023-03-03: qty 1

## 2023-03-03 MED ORDER — BENZONATATE 100 MG PO CAPS
100.0000 mg | ORAL_CAPSULE | Freq: Once | ORAL | Status: AC
  Administered 2023-03-03: 100 mg via ORAL
  Filled 2023-03-03: qty 1

## 2023-03-03 NOTE — ED Provider Notes (Addendum)
[place] EMERGENCY DEPARTMENT AT [facility] Provider Note   CSN: [id number] Arrival date & time: 03/03/23  1523     History  Chief Complaint  Patient presents with   Cough   HPI Anniston Kleinke is a 37 y.o. female presenting for cough and sore throat. Started 1 week ago.  Denies associated fever.  States cough is productive with clear whitish sputum. States that 2 weeks ago her children both tested positive for COVID.  Denies chest pain shortness of breath.   Cough      Home Medications Prior to Admission medications   Medication Sig Start Date End Date Taking? Authorizing Provider  benzonatate (TESSALON) 100 MG capsule Take 1 capsule (100 mg total) by mouth every 8 (eight) hours. 03/03/23  Yes Gareth Eagle, PA-C  Accu-Chek Softclix Lancets lancets Check blood sugar four times a day. 01/11/22   Cheral Marker, CNM  acetaminophen (TYLENOL) 325 MG tablet Take 2 tablets (650 mg total) by mouth every 4 (four) hours as needed for mild pain (temperature > 101.5.). 06/14/22   Cashion, Colter L, MD  diclofenac (VOLTAREN) 75 MG EC tablet Take 1 tablet (75 mg total) by mouth 2 (two) times daily. 10/11/22   Elson Areas, PA-C  doxycycline (VIBRAMYCIN) 100 MG capsule Take 1 capsule (100 mg total) by mouth 2 (two) times daily. 10/11/22   Elson Areas, PA-C  glucose blood (ACCU-CHEK GUIDE) test strip Check blood sugar four times a day. 01/11/22   Cheral Marker, CNM  ibuprofen (ADVIL) 600 MG tablet Take 1 tablet (600 mg total) by mouth every 6 (six) hours. Patient not taking: Reported on 07/20/2022 06/14/22   Cashion, Colter L, MD  metFORMIN (GLUCOPHAGE) 1000 MG tablet 1 tablet in the morning, 1 tablet at bedtime 03/28/22   Cheral Marker, CNM  oxyCODONE (OXY IR/ROXICODONE) 5 MG immediate release tablet Take 1-2 tablets (5-10 mg total) by mouth every 4 (four) hours as needed for moderate pain. Patient not taking: Reported on 06/30/2022 06/14/22   Mercado-Ortiz, Lahoma Crocker, DO  pantoprazole (PROTONIX) 20 MG tablet Take 1 tablet (20 mg total) by mouth daily. 12/27/21   Cheral Marker, CNM  prenatal vitamin w/FE, FA (PRENATAL 1 + 1) 27-1 MG TABS tablet Take 1 tablet by mouth daily at 12 noon. 08/09/21   Cheral Marker, CNM  senna-docusate (SENOKOT-S) 8.6-50 MG tablet Take 2 tablets by mouth 2 (two) times daily. 06/14/22   Cashion, Colter L, MD      Allergies    Rocephin [ceftriaxone sodium in dextrose]    Review of Systems   Review of Systems  Respiratory:  Positive for cough.     Physical Exam   Vitals:   03/03/23 1529  BP: (!) 150/96  Pulse: 88  Resp: 18  Temp: 98.6 F (37 C)  SpO2: 98%    CONSTITUTIONAL:  well-appearing, NAD NEURO:  Alert and oriented x 3, CN 3-12 grossly intact EYES:  eyes equal and reactive ENT/NECK:  Supple, no stridor  CARDIO:  Regular rate and rhythm, appears well-perfused  PULM:  No respiratory distress, CTAB GI/GU:  non-distended, soft MSK/SPINE:  No gross deformities, no edema, moves all extremities  SKIN:  no rash, atraumatic  *Additional and/or pertinent findings included in MDM below  ED Results / Procedures / Treatments   Labs (all labs ordered are listed, but only abnormal results are displayed) Labs Reviewed  SARS CORONAVIRUS 2 BY RT PCR  GROUP A STREP BY  PCR    EKG None  Radiology DG Chest Portable 1 View  Result Date: 03/03/2023 CLINICAL DATA:  Cough and sore throat for 1 week. EXAM: PORTABLE CHEST 1 VIEW COMPARISON:  None Available. FINDINGS: 1547 hours. The heart size and mediastinal contours are normal. The lungs are clear. There is no pleural effusion or pneumothorax. No acute osseous findings are identified. IMPRESSION: No active cardiopulmonary process. Electronically Signed   By: Carey Bullocks M.D.   On: 03/03/2023 16:01    Procedures Procedures    Medications Ordered in ED Medications  benzonatate (TESSALON) capsule 100 mg (has no administration in time range)  ibuprofen  (ADVIL) tablet 800 mg (has no administration in time range)    ED Course/ Medical Decision Making/ A&P                             Medical Decision Making Amount and/or Complexity of Data Reviewed Radiology: ordered.  Risk Prescription drug management.   37 year old well-appearing female presenting for cough and sore throat.  Exam was unremarkable.  DDx includes COVID or other URI, pneumonia, strep pharyngitis, peritonsillar abscess. COVID and strep PCR were both negative.  Considered pneumonia but unlikely given no focal findings on x-ray and no fever.  Suspect symptoms are likely related to viral URI.  Treated with ibuprofen and Tessalon Perles.  Advised conservative treatment at home. Discussed pertinent return precautions.  Also advised to follow-up with PCP.  Vitals stable throughout encounter.  Discharged home.    Final Clinical Impression(s) / ED Diagnoses Final diagnoses:  Cough, unspecified type  Sore throat    Rx / DC Orders ED Discharge Orders          Ordered    benzonatate (TESSALON) 100 MG capsule  Every 8 hours        03/03/23 1656              Gareth Eagle, PA-C 03/03/23 1659    Mardene Sayer, MD 03/03/23 2329

## 2023-03-03 NOTE — ED Triage Notes (Signed)
Pt presents with cough, sore throat, x1 week, 2 weeks ago children tested positive for Covid.

## 2023-03-03 NOTE — Discharge Instructions (Addendum)
Evaluation for cough and sore throat was overall reassuring.  COVID and strep throat test were negative.  Symptoms however are consistent with a viral upper respiratory infection.  Recommend conservative treatment at home which includes rest and hydration.  Also he can take Tylenol and ibuprofen as needed for symptoms.  Recommend he follow-up with your PCP.  If you have new shortness of breath, chest pain, calf tenderness or any other concerning symptom please return emergency department further evaluation.  I also sent Tessalon Perles to your pharmacy for your cough.

## 2023-04-15 ENCOUNTER — Emergency Department (HOSPITAL_COMMUNITY): Payer: Self-pay

## 2023-04-15 ENCOUNTER — Other Ambulatory Visit: Payer: Self-pay

## 2023-04-15 ENCOUNTER — Emergency Department (HOSPITAL_COMMUNITY)
Admission: EM | Admit: 2023-04-15 | Discharge: 2023-04-15 | Disposition: A | Discharge status: Home / Self Care | Payer: Self-pay | Attending: Emergency Medicine | Admitting: Emergency Medicine

## 2023-04-15 DIAGNOSIS — R300 Dysuria: Secondary | ICD-10-CM

## 2023-04-15 DIAGNOSIS — R102 Pelvic and perineal pain: Secondary | ICD-10-CM | POA: Insufficient documentation

## 2023-04-15 DIAGNOSIS — N3090 Cystitis, unspecified without hematuria: Secondary | ICD-10-CM | POA: Insufficient documentation

## 2023-04-15 LAB — URINALYSIS, ROUTINE W REFLEX MICROSCOPIC
Bilirubin Urine: NEGATIVE
Glucose, UA: NEGATIVE mg/dL
Ketones, ur: NEGATIVE mg/dL
Nitrite: NEGATIVE
Protein, ur: NEGATIVE mg/dL
Specific Gravity, Urine: 1.003 — ABNORMAL LOW (ref 1.005–1.030)
pH: 6 (ref 5.0–8.0)

## 2023-04-15 LAB — WET PREP, GENITAL
Clue Cells Wet Prep HPF POC: NONE SEEN
Sperm: NONE SEEN
Trich, Wet Prep: NONE SEEN
WBC, Wet Prep HPF POC: <10 (ref <10)
Yeast Wet Prep HPF POC: NONE SEEN

## 2023-04-15 LAB — CBG MONITORING, ED: Glucose-Capillary: 128 mg/dL — ABNORMAL HIGH (ref 70–99)

## 2023-04-15 LAB — POC URINE PREG, ED: Preg Test, Ur: NEGATIVE

## 2023-04-15 MED ORDER — ACETAMINOPHEN 500 MG PO TABS
1000.0000 mg | ORAL_TABLET | Freq: Once | ORAL | Status: AC
  Administered 2023-04-15: 1000 mg via ORAL
  Filled 2023-04-15: qty 2

## 2023-04-15 MED ORDER — PHENAZOPYRIDINE HCL 200 MG PO TABS: 200.0000 mg | ORAL_TABLET | Freq: Three times a day (TID) | ORAL | 0 refills | Status: DC

## 2023-04-15 MED ORDER — NITROFURANTOIN MONOHYD MACRO 100 MG PO CAPS: 100.0000 mg | ORAL_CAPSULE | Freq: Two times a day (BID) | ORAL | 0 refills | Status: AC

## 2023-04-15 MED ORDER — KETOROLAC TROMETHAMINE 30 MG/ML IJ SOLN
30.0000 mg | Freq: Once | INTRAMUSCULAR | Status: AC
  Administered 2023-04-15: 30 mg via INTRAMUSCULAR
  Filled 2023-04-15: qty 1

## 2023-04-15 NOTE — Discharge Instructions (Signed)
Your lab tests and ultrasound today are reassuring, because there is a moderate amount of white blood cells and some bacteria in your urine, given your symptoms I am treating this as a urinary tract infection, although the urine culture which should result in 2 days will give [place] better information.  I have also prescribed you Pyridium which is a medicine that will help relieve your frequent urination symptoms.  This will turn your urine bright orange so do not be surprised by this.  Your gonorrhea and Chlamydia tests are also pending and should result within 2 days, your exam does not suggest you have either of these infections however.  I do recommend GYN follow-up care for your symptoms and your complaint of painful intercourse.  You have been referred to Dr[facility] Despina [facility] for this.

## 2023-04-15 NOTE — ED Notes (Signed)
Verbal order for CBG per Burgess Amor PA

## 2023-04-15 NOTE — ED Notes (Signed)
Patient transported to Ultrasound 

## 2023-04-15 NOTE — ED Provider Notes (Signed)
[place] EMERGENCY DEPARTMENT AT [facility] Provider Note   CSN: [id number] Arrival date & time: 04/15/23  4742     History  Chief Complaint  Patient presents with   Vaginal Discharge   Dysuria    Colleen Arnold is a 37 y.o. female presenting for evaluation of a 1 week history of white vaginal discharge along with vaginal itching, also describing burning pain along with increased frequency of urination along with urgency, denies hematuria.  She has pain that radiates into her right lower back.  Past medical history is significant for left ovarian cyst which required oophorectomy, she is currently 10 months postpartum and also notes dyspareunia since the delivery of her baby.  She is currently under the care of the health department, is trying to get in with [facility].  Denies pregnancy, denies risk for STDs.  She has had no fevers, no flank pain, no nausea or vomiting.  Denies prior history of UTIs.  She does state she was treated for chlamydia years ago, states today symptoms do not remind her of that infection.  She is most concerned about possibility of an ovarian cyst on her right.  She has had no fevers, denies loss of appetite, has been has no symptoms.  The history is provided by the patient. No language interpreter was used (Although chart suggests need for Spanish interpreter, patient has a good understanding of [place].).       Home Medications Prior to Admission medications   Medication Sig Start Date End Date Taking? Authorizing Provider  nitrofurantoin, macrocrystal-monohydrate, (MACROBID) 100 MG capsule Take 1 capsule (100 mg total) by mouth 2 (two) times daily for 7 days. 04/15/23 04/22/23 Yes Kohl Polinsky, Raynelle Fanning, PA-C  phenazopyridine (PYRIDIUM) 200 MG tablet Take 1 tablet (200 mg total) by mouth 3 (three) times daily. 04/15/23  Yes Davian Wollenberg, Raynelle Fanning, PA-C  Accu-Chek Softclix Lancets lancets Check blood sugar four times a day. 01/11/22   Cheral Marker, CNM  acetaminophen  (TYLENOL) 325 MG tablet Take 2 tablets (650 mg total) by mouth every 4 (four) hours as needed for mild pain (temperature > 101.5.). 06/14/22   Cashion, Colter L, MD  benzonatate (TESSALON) 100 MG capsule Take 1 capsule (100 mg total) by mouth every 8 (eight) hours. 03/03/23   Gareth Eagle, PA-C  diclofenac (VOLTAREN) 75 MG EC tablet Take 1 tablet (75 mg total) by mouth 2 (two) times daily. 10/11/22   Elson Areas, PA-C  glucose blood (ACCU-CHEK GUIDE) test strip Check blood sugar four times a day. 01/11/22   Cheral Marker, CNM  ibuprofen (ADVIL) 600 MG tablet Take 1 tablet (600 mg total) by mouth every 6 (six) hours. Patient not taking: Reported on 07/20/2022 06/14/22   Cashion, Colter L, MD  metFORMIN (GLUCOPHAGE) 1000 MG tablet 1 tablet in the morning, 1 tablet at bedtime 03/28/22   Cheral Marker, CNM  oxyCODONE (OXY IR/ROXICODONE) 5 MG immediate release tablet Take 1-2 tablets (5-10 mg total) by mouth every 4 (four) hours as needed for moderate pain. Patient not taking: Reported on 06/30/2022 06/14/22   Mercado-Ortiz, Lahoma Crocker, DO  pantoprazole (PROTONIX) 20 MG tablet Take 1 tablet (20 mg total) by mouth daily. 12/27/21   Cheral Marker, CNM  prenatal vitamin w/FE, FA (PRENATAL 1 + 1) 27-1 MG TABS tablet Take 1 tablet by mouth daily at 12 noon. 08/09/21   Cheral Marker, CNM  senna-docusate (SENOKOT-S) 8.6-50 MG tablet Take 2 tablets by mouth 2 (two) times  daily. 06/14/22   Cashion, Colter L, MD      Allergies    Rocephin [ceftriaxone sodium in dextrose]    Review of Systems   Review of Systems  Constitutional:  Negative for chills and fever.  HENT:  Negative for congestion and sore throat.   Eyes: Negative.   Respiratory:  Negative for chest tightness and shortness of breath.   Cardiovascular:  Negative for chest pain.  Gastrointestinal:  Negative for abdominal pain, nausea and vomiting.  Genitourinary:  Positive for dyspareunia, dysuria, pelvic pain and vaginal  discharge. Negative for hematuria and menstrual problem.  Musculoskeletal:  Positive for back pain. Negative for arthralgias, joint swelling and neck pain.  Skin: Negative.  Negative for rash and wound.  Neurological:  Negative for dizziness, weakness, light-headedness, numbness and headaches.  Psychiatric/Behavioral: Negative.    All other systems reviewed and are negative.   Physical Exam Updated Vital Signs BP (!) 97/38 (BP Location: Left Arm)   Pulse 65   Temp 98.1 F (36.7 C) (Oral)   Resp 16   Ht 5\' 3"  (1.6 m)   Wt 126.1 kg   LMP 03/28/2023 (Exact Date)   SpO2 99%   BMI 49.26 kg/m  Physical Exam Vitals and nursing note reviewed. Exam conducted with a chaperone present.  Constitutional:      Appearance: She is well-developed.  HENT:     Head: Normocephalic and atraumatic.  Eyes:     Conjunctiva/sclera: Conjunctivae normal.  Cardiovascular:     Rate and Rhythm: Normal rate and regular rhythm.     Heart sounds: Normal heart sounds.  Pulmonary:     Effort: Pulmonary effort is normal.     Breath sounds: Normal breath sounds. No wheezing.  Abdominal:     General: Bowel sounds are normal.     Palpations: Abdomen is soft.     Tenderness: There is no abdominal tenderness.  Genitourinary:    Vagina: No vaginal discharge.     Cervix: No cervical motion tenderness or discharge.     Uterus: Normal.      Adnexa:        Right: Tenderness present. No mass or fullness.    Musculoskeletal:        General: Normal range of motion.     Cervical back: Normal range of motion.  Skin:    General: Skin is warm and dry.  Neurological:     Mental Status: She is alert.     ED Results / Procedures / Treatments   Labs (all labs ordered are listed, but only abnormal results are displayed) Labs Reviewed  URINALYSIS, ROUTINE W REFLEX MICROSCOPIC - Abnormal; Notable for the following components:      Result Value   Color, Urine STRAW (*)    Specific Gravity, Urine 1.003 (*)    Hgb  urine dipstick MODERATE (*)    Leukocytes,Ua MODERATE (*)    Bacteria, UA RARE (*)    All other components within normal limits  CBG MONITORING, ED - Abnormal; Notable for the following components:   Glucose-Capillary 128 (*)    All other components within normal limits  WET PREP, GENITAL  POC URINE PREG, ED  GC/CHLAMYDIA PROBE AMP ([place]) NOT AT [facility]    EKG None  Radiology US Pelvis Complete  Result Date: 04/15/2023 CLINICAL DATA:  Abdominal pain with white discharge and burning for 1 day. Status post left oophorectomy in 2019 and 3 cesarean sections. EXAM: TRANSABDOMINAL AND TRANSVAGINAL ULTRASOUND OF PELVIS DOPPLER ULTRASOUND  OF OVARIES TECHNIQUE: Both transabdominal and transvaginal ultrasound examinations of the pelvis were performed. Transabdominal technique was performed for global imaging of the pelvis including uterus, ovaries, adnexal regions, and pelvic cul-de-sac. It was necessary to proceed with endovaginal exam following the transabdominal exam to visualize the the uterus and endometrium. Color and duplex Doppler ultrasound was utilized to evaluate blood flow to the ovaries. COMPARISON:  None Available. FINDINGS: Uterus Measurements: 10.2 x 4.5 x 6.2 cm = volume: 148.7 mL. No fibroids or other mass visualized. Endometrium Thickness: 15 mm. There is trace endometrial fluid. No focal abnormality visualized. Right ovary Measurements: 3.2 x 2.3 x 2.1 cm = volume: 8.3 mL. Normal appearance/no adnexal mass. Pulsed Doppler evaluation of the right ovary demonstrates normal low-resistance arterial and venous waveforms. Left ovary Surgically absent. Other findings No abnormal free fluid. IMPRESSION: Normal sonographic appearance of the uterus and right ovary. The left ovary is surgically absent. Electronically Signed   By: Jacob Moores M.D.   On: 04/15/2023 13:07   US Transvaginal Non-OB  Result Date: 04/15/2023 CLINICAL DATA:  Abdominal pain with white discharge and burning for 1  day. Status post left oophorectomy in 2019 and 3 cesarean sections. EXAM: TRANSABDOMINAL AND TRANSVAGINAL ULTRASOUND OF PELVIS DOPPLER ULTRASOUND OF OVARIES TECHNIQUE: Both transabdominal and transvaginal ultrasound examinations of the pelvis were performed. Transabdominal technique was performed for global imaging of the pelvis including uterus, ovaries, adnexal regions, and pelvic cul-de-sac. It was necessary to proceed with endovaginal exam following the transabdominal exam to visualize the the uterus and endometrium. Color and duplex Doppler ultrasound was utilized to evaluate blood flow to the ovaries. COMPARISON:  None Available. FINDINGS: Uterus Measurements: 10.2 x 4.5 x 6.2 cm = volume: 148.7 mL. No fibroids or other mass visualized. Endometrium Thickness: 15 mm. There is trace endometrial fluid. No focal abnormality visualized. Right ovary Measurements: 3.2 x 2.3 x 2.1 cm = volume: 8.3 mL. Normal appearance/no adnexal mass. Pulsed Doppler evaluation of the right ovary demonstrates normal low-resistance arterial and venous waveforms. Left ovary Surgically absent. Other findings No abnormal free fluid. IMPRESSION: Normal sonographic appearance of the uterus and right ovary. The left ovary is surgically absent. Electronically Signed   By: Jacob Moores M.D.   On: 04/15/2023 13:07   US PELVIC DOPPLER (TORSION R/O OR MASS ARTERIAL FLOW)  Result Date: 04/15/2023 CLINICAL DATA:  Abdominal pain with white discharge and burning for 1 day. Status post left oophorectomy in 2019 and 3 cesarean sections. EXAM: TRANSABDOMINAL AND TRANSVAGINAL ULTRASOUND OF PELVIS DOPPLER ULTRASOUND OF OVARIES TECHNIQUE: Both transabdominal and transvaginal ultrasound examinations of the pelvis were performed. Transabdominal technique was performed for global imaging of the pelvis including uterus, ovaries, adnexal regions, and pelvic cul-de-sac. It was necessary to proceed with endovaginal exam following the transabdominal exam  to visualize the the uterus and endometrium. Color and duplex Doppler ultrasound was utilized to evaluate blood flow to the ovaries. COMPARISON:  None Available. FINDINGS: Uterus Measurements: 10.2 x 4.5 x 6.2 cm = volume: 148.7 mL. No fibroids or other mass visualized. Endometrium Thickness: 15 mm. There is trace endometrial fluid. No focal abnormality visualized. Right ovary Measurements: 3.2 x 2.3 x 2.1 cm = volume: 8.3 mL. Normal appearance/no adnexal mass. Pulsed Doppler evaluation of the right ovary demonstrates normal low-resistance arterial and venous waveforms. Left ovary Surgically absent. Other findings No abnormal free fluid. IMPRESSION: Normal sonographic appearance of the uterus and right ovary. The left ovary is surgically absent. Electronically Signed   By: Jacob Moores  M.D.   On: 04/15/2023 13:07    Procedures Procedures    Medications Ordered in ED Medications  acetaminophen (TYLENOL) tablet 1,000 mg (1,000 mg Oral Given 04/15/23 0806)  ketorolac (TORADOL) 30 MG/ML injection 30 mg (30 mg Intramuscular Given 04/15/23 1258)    ED Course/ Medical Decision Making/ A&P                             Medical Decision Making Patient with 1 week history of vaginal discharge and itching/irritation, also with midline to right lower pelvic pain.  She has a history of ovarian cysts and is concerned about possibility of cyst as the source of this pain.  She has had a left oophorectomy.  Denies risk for STDs.  Differential diagnosis including vaginitis, STD, ovarian cyst/torsion, UTI, pregnancy, vaginitis  Amount and/or Complexity of Data Reviewed Labs: ordered.    Details: Resulted labs are reassuring, she does have moderate leukocytes in her urine rare bacteria, given her symptoms of dysuria and frequency, she is given a 3-day course of Pyridium and also a 3-day course of antibiotics.  Also no back pain or fever, urine culture has been ordered.  Her GC chlamydia cultures are also pending.   We discussed that this test is pending and offered her treatment for these infections today in lieu of waiting for culture results.  She has no CMT on exam, no vaginal discharge was found on my exam and excessive of physiologic findings.  I doubt this represents cervicitis or PID.  She has opted to wait for these cultures before treatment. Radiology: ordered.    Details: Ultrasound was completed, ruled out ovarian torsion/cyst.  Risk OTC drugs. Prescription drug management.           Final Clinical Impression(s) / ED Diagnoses Final diagnoses:  Dysuria  Cystitis    Rx / DC Orders ED Discharge Orders          Ordered    phenazopyridine (PYRIDIUM) 200 MG tablet  3 times daily        04/15/23 1417    nitrofurantoin, macrocrystal-monohydrate, (MACROBID) 100 MG capsule  2 times daily        04/15/23 1417              Burgess Amor, PA-C 04/16/23 1633    Bethann Berkshire, MD 04/17/23 1145

## 2023-04-15 NOTE — ED Triage Notes (Signed)
Pt c/o burn, itching and white discharge from vagina. Pt also co abd pain and burning with urination with frequency.

## 2023-04-17 LAB — GC/CHLAMYDIA PROBE AMP (~~LOC~~) NOT AT ARMC
Chlamydia: NEGATIVE
Comment: NEGATIVE
Comment: NORMAL
Neisseria Gonorrhea: NEGATIVE

## 2023-08-16 ENCOUNTER — Other Ambulatory Visit (HOSPITAL_COMMUNITY)
Admission: RE | Admit: 2023-08-16 | Discharge: 2023-08-16 | Disposition: A | Discharge status: Home / Self Care | Payer: Self-pay | Source: Ambulatory Visit | Attending: Adult Health | Admitting: Adult Health

## 2023-08-16 ENCOUNTER — Encounter: Payer: Self-pay | Admitting: Adult Health

## 2023-08-16 ENCOUNTER — Ambulatory Visit (INDEPENDENT_AMBULATORY_CARE_PROVIDER_SITE_OTHER): Payer: Self-pay | Admitting: Adult Health

## 2023-08-16 VITALS — BP 136/84 | HR 64 | Ht 62.0 in | Wt 266.5 lb

## 2023-08-16 DIAGNOSIS — Z8759 Personal history of other complications of pregnancy, childbirth and the puerperium: Secondary | ICD-10-CM | POA: Insufficient documentation

## 2023-08-16 DIAGNOSIS — L299 Pruritus, unspecified: Secondary | ICD-10-CM

## 2023-08-16 DIAGNOSIS — Z1339 Encounter for screening examination for other mental health and behavioral disorders: Secondary | ICD-10-CM

## 2023-08-16 DIAGNOSIS — Z13220 Encounter for screening for lipoid disorders: Secondary | ICD-10-CM | POA: Insufficient documentation

## 2023-08-16 DIAGNOSIS — Z1329 Encounter for screening for other suspected endocrine disorder: Secondary | ICD-10-CM

## 2023-08-16 DIAGNOSIS — Z01419 Encounter for gynecological examination (general) (routine) without abnormal findings: Secondary | ICD-10-CM

## 2023-08-16 LAB — COMPREHENSIVE METABOLIC PANEL
ALT: 41 U/L (ref 0–44)
AST: 31 U/L (ref 15–41)
Albumin: 3.7 g/dL (ref 3.5–5.0)
Alkaline Phosphatase: 53 U/L (ref 38–126)
Anion gap: 9 (ref 5–15)
BUN: 12 mg/dL (ref 6–20)
CO2: 22 mmol/L (ref 22–32)
Calcium: 8.4 mg/dL — ABNORMAL LOW (ref 8.9–10.3)
Chloride: 104 mmol/L (ref 98–111)
Creatinine, Ser: 0.51 mg/dL (ref 0.44–1.00)
GFR, Estimated: >60 mL/min (ref >60)
Glucose, Bld: 111 mg/dL — ABNORMAL HIGH (ref 70–99)
Potassium: 3.8 mmol/L (ref 3.5–5.1)
Sodium: 135 mmol/L (ref 135–145)
Total Bilirubin: 0.4 mg/dL (ref 0.3–1.2)
Total Protein: 7.1 g/dL (ref 6.5–8.1)

## 2023-08-16 LAB — LIPID PANEL
Cholesterol: 173 mg/dL (ref 0–200)
HDL: 50 mg/dL (ref >40)
LDL Cholesterol: 108 mg/dL — ABNORMAL HIGH (ref 0–99)
Total CHOL/HDL Ratio: 3.5 {ratio}
Triglycerides: 73 mg/dL (ref <150)
VLDL: 15 mg/dL (ref 0–40)

## 2023-08-16 LAB — CBC
HCT: 38.1 % (ref 36.0–46.0)
Hemoglobin: 13 g/dL (ref 12.0–15.0)
MCH: 30.7 pg (ref 26.0–34.0)
MCHC: 34.1 g/dL (ref 30.0–36.0)
MCV: 90.1 fL (ref 80.0–100.0)
Platelets: 218 10*3/uL (ref 150–400)
RBC: 4.23 MIL/uL (ref 3.87–5.11)
RDW: 12.8 % (ref 11.5–15.5)
WBC: 6.3 10*3/uL (ref 4.0–10.5)
nRBC: 0 % (ref 0.0–0.2)

## 2023-08-16 LAB — HEMOGLOBIN A1C
Hgb A1c MFr Bld: 5.7 % — ABNORMAL HIGH (ref 4.8–5.6)
Mean Plasma Glucose: 116.89 mg/dL

## 2023-08-16 LAB — TSH: TSH: 1.563 u[IU]/mL (ref 0.350–4.500)

## 2023-08-16 LAB — T4, FREE: Free T4: 0.75 ng/dL (ref 0.61–1.12)

## 2023-08-16 MED ORDER — LEVOCETIRIZINE DIHYDROCHLORIDE 5 MG PO TABS: ORAL_TABLET | ORAL | 99 refills | Status: AC

## 2023-08-16 NOTE — Progress Notes (Signed)
Patient ID: Colleen Arnold, female   DOB: 09/17/1986, 37 y.o.   MRN: [id number] History of Present Illness: Colleen Arnold is a 37 year old Hispanic female, married, [id number] in for a well woman gyn exam and is complaining of itching all over esp at night.     Component Value Date/Time   DIAGPAP  06/18/2021 1300    - Negative for intraepithelial lesion or malignancy (NILM)   HPVHIGH Negative 06/18/2021 1300   ADEQPAP  06/18/2021 1300    Satisfactory for evaluation; transformation zone component PRESENT.   PCP is [facility]   Current Medications, Allergies, Past Medical History, Past Surgical History, Family History and Social History were reviewed in [employer] record.     Review of Systems: Patient denies any headaches, hearing loss, fatigue, blurred vision, shortness of breath, chest pain, abdominal pain, problems with bowel movements, urination, or intercourse. No joint pain or mood swings.  +itching all over Stressed, son has health issues, and has 86 yo daughter Hx gestational diabetes   Physical Exam:BP 136/84 (BP Location: Right Arm, Patient Position: Sitting, Cuff Size: Normal)   Pulse 64   Ht 5\' 2"  (1.575 m)   Wt 266 lb 8 oz (120.9 kg)   LMP 07/22/2023   Breastfeeding No   BMI 48.74 kg/m   General:  Well developed, well nourished, no acute distress Skin:  Warm and dry Neck:  Midline trachea, normal thyroid, good ROM, no lymphadenopathy Lungs; Clear to auscultation bilaterally Breast:  No dominant palpable mass, retraction, or nipple discharge Cardiovascular: Regular rate and rhythm Abdomen:  Soft, non tender, no hepatosplenomegaly Pelvic:  External genitalia is normal in appearance, no lesions.  The vagina is normal in appearance. Urethra has no lesions or masses. The cervix is bulbous.  Uterus is felt to be normal size, shape, and contour.  No adnexal masses or tenderness noted.Bladder is non tender, no masses felt. Extremities/musculoskeletal:  No swelling or  varicosities noted, no clubbing or cyanosis Psych:  No mood changes, alert and cooperative,seems happy AA is 2 Fall risk is low    08/16/2023   10:52 AM 12/27/2021    3:45 PM 08/04/2020    8:36 AM  Depression screen PHQ 2/9  Decreased Interest 1 0 0  Down, Depressed, Hopeless 1 0 0  PHQ - 2 Score 2 0 0  Altered sleeping 2 1   Tired, decreased energy 2 1   Change in appetite 1 0   Feeling bad or failure about yourself  1 0   Trouble concentrating 0 0   Moving slowly or fidgety/restless 0 0   Suicidal thoughts 0 0   PHQ-9 Score 8 2        08/16/2023   10:52 AM 12/27/2021    3:45 PM 07/27/2020    9:29 AM 04/27/2020   10:35 AM  GAD 7 : Generalized Anxiety Score  Nervous, Anxious, on Edge 1 0 0 0  Control/stop worrying 1 0 1 0  Worry too much - different things 1 0 1 0  Trouble relaxing 1 0 0 0  Restless 1 0 0 0  Easily annoyed or irritable 1 0 0 0  Afraid - awful might happen 1 0 1 0  Total GAD 7 Score 7 0 3 0    Upstream - 08/16/23 1014       Pregnancy Intention Screening   Does the patient want to become pregnant in the next year? No    Does the patient's partner want to become  pregnant in the next year? No    Would the patient like to discuss contraceptive options today? No      Contraception Wrap Up   Current Method Female Sterilization    End Method Female Sterilization    Contraception Counseling Provided No            Examination chaperoned by Faith Rogue LPN     Impression and Plan: 1. Encounter for well woman exam with routine gynecological exam Pap and physical in 1 year Will check labs  - CBC - Comprehensive metabolic panel - Lipid panel She is trying to lose weight Try to be active 30-60 minutes every day   2. Itching Try xyzal 5 mg at bedtime Meds ordered this encounter  Medications   levocetirizine (XYZAL) 5 MG tablet    Sig: Take 1 tablet at bedtime    Dispense:  30 tablet    Refill:  PRN    Order Specific Question:   Supervising  Provider    Answer:   Despina Hidden, LUTHER H [[id number]]     3. History of gestational hypertension - Hemoglobin A1c  4. Screening cholesterol level - Lipid panel  5. Screening for thyroid disorder - TSH + free T4

## 2023-08-17 ENCOUNTER — Telehealth: Payer: Self-pay

## 2023-08-17 ENCOUNTER — Telehealth: Payer: Self-pay | Admitting: *Deleted

## 2023-08-17 NOTE — Telephone Encounter (Signed)
Can Colleen Arnold please call her.  She said [place] sent her a message but, she can not read it because it is in [place].

## 2023-08-17 NOTE — Telephone Encounter (Signed)
Interpreter called pt and explained test results. Pt's questions were answered and pt voiced understanding. JSY

## 2023-09-27 ENCOUNTER — Emergency Department (HOSPITAL_COMMUNITY)
Admission: EM | Admit: 2023-09-27 | Discharge: 2023-09-27 | Disposition: A | Discharge status: Home / Self Care | Payer: Self-pay | Attending: Emergency Medicine | Admitting: Emergency Medicine

## 2023-09-27 ENCOUNTER — Other Ambulatory Visit: Payer: Self-pay

## 2023-09-27 ENCOUNTER — Encounter (HOSPITAL_COMMUNITY): Payer: Self-pay | Admitting: Emergency Medicine

## 2023-09-27 DIAGNOSIS — M5442 Lumbago with sciatica, left side: Secondary | ICD-10-CM | POA: Insufficient documentation

## 2023-09-27 DIAGNOSIS — M5432 Sciatica, left side: Secondary | ICD-10-CM

## 2023-09-27 MED ORDER — HYDROCODONE-ACETAMINOPHEN 5-325 MG PO TABS: 1.0000 | ORAL_TABLET | Freq: Four times a day (QID) | ORAL | 0 refills | Status: AC | PRN

## 2023-09-27 MED ORDER — PREDNISONE 10 MG PO TABS: 40.0000 mg | ORAL_TABLET | Freq: Every day | ORAL | 0 refills | Status: AC

## 2023-09-27 NOTE — ED Provider Notes (Signed)
[name] [facility] AT [facility] Provider Note   CSN: [id number] Arrival date & time: 09/27/23  4098     History  Chief Complaint  Patient presents with   Back Pain    Colleen Arnold is a 37 y.o. female.  Patient with a complaint of lower lower back pain mostly on the left side radiates into the left leg down to the level of the knee.  No fall or injury.  Patient has not had symptoms like this before.  No numbness or weakness to the foot.  Patient has been taking Tylenol with no relief.  No involvement to the right lower extremity no incontinence.  Past medical history significant for gallbladder removal ovarian cyst gestational diabetes.  Chart says patient needs Spanish interpreter.  But she understands English very well.  Able to communicate with her without any difficulties.       Home Medications Prior to Admission medications   Medication Sig Start Date End Date Taking? Authorizing Provider  acetaminophen (TYLENOL) 325 MG tablet Take 2 tablets (650 mg total) by mouth every 4 (four) hours as needed for mild pain (temperature > 101.5.). 06/14/22  Yes Cashion, Colter L, MD  HYDROcodone-acetaminophen (NORCO/VICODIN) 5-325 MG tablet Take 1 tablet by mouth every 6 (six) hours as needed. 09/27/23  Yes Vanetta Mulders, MD  levocetirizine (XYZAL) 5 MG tablet Take 1 tablet at bedtime 08/16/23  Yes Cyril Mourning A, NP  predniSONE (DELTASONE) 10 MG tablet Take 4 tablets (40 mg total) by mouth daily. 09/27/23  Yes Vanetta Mulders, MD      Allergies    Rocephin [ceftriaxone sodium in dextrose]    Review of Systems   Review of Systems  Constitutional:  Negative for chills and fever.  HENT:  Negative for ear pain and sore throat.   Eyes:  Negative for pain and visual disturbance.  Respiratory:  Negative for cough and shortness of breath.   Cardiovascular:  Negative for chest pain and palpitations.  Gastrointestinal:  Negative for abdominal pain and  vomiting.  Genitourinary:  Negative for dysuria and hematuria.  Musculoskeletal:  Positive for back pain. Negative for arthralgias.  Skin:  Negative for color change and rash.  Neurological:  Negative for seizures and syncope.  All other systems reviewed and are negative.   Physical Exam Updated Vital Signs BP 134/84 (BP Location: Left Wrist)   Pulse 68   Temp 98 F (36.7 C) (Oral)   Resp 17   LMP 09/19/2023   SpO2 97%  Physical Exam Vitals and nursing note reviewed.  Constitutional:      General: She is not in acute distress.    Appearance: She is well-developed.  HENT:     Head: Normocephalic and atraumatic.  Eyes:     Conjunctiva/sclera: Conjunctivae normal.  Cardiovascular:     Rate and Rhythm: Normal rate and regular rhythm.     Heart sounds: No murmur heard. Pulmonary:     Effort: Pulmonary effort is normal. No respiratory distress.     Breath sounds: Normal breath sounds.  Abdominal:     Palpations: Abdomen is soft.     Tenderness: There is no abdominal tenderness.  Musculoskeletal:        General: Tenderness present. No swelling.     Cervical back: Neck supple.     Comments: Tenderness to left lumbar area.  Left lower extremity neurovascularly intact.  Dorsalis pedis pulses 2+.  Right lower extremity normal.  Skin:    General: Skin is  warm and dry.     Capillary Refill: Capillary refill takes less than 2 seconds.  Neurological:     General: No focal deficit present.     Mental Status: She is alert and oriented to person, place, and time.  Psychiatric:        Mood and Affect: Mood normal.     ED Results / Procedures / Treatments   Labs (all labs ordered are listed, but only abnormal results are displayed) Labs Reviewed - No data to display  EKG None  Radiology No results found.  Procedures Procedures    Medications Ordered in ED Medications - No data to display  ED Course/ Medical Decision Making/ A&P                                  Medical Decision Making Risk Prescription drug management.   Symptoms consistent with uncomplicated left-sided sciatica.  Will treat with prednisone will give a short course of hydrocodone.  Have her follow-up with orthopedics.  Work note provided if needed.   Final Clinical Impression(s) / ED Diagnoses Final diagnoses:  Sciatica of left side    Rx / DC Orders ED Discharge Orders          Ordered    predniSONE (DELTASONE) 10 MG tablet  Daily        09/27/23 0850    HYDROcodone-acetaminophen (NORCO/VICODIN) 5-325 MG tablet  Every 6 hours PRN        09/27/23 0851              Vanetta Mulders, MD 09/27/23 [phone]

## 2023-09-27 NOTE — Discharge Instructions (Addendum)
Take the prednisone as directed for the next 5 days.  Take the hydrocodone as needed for pain relief mostly at night.  Make an appointment to follow-up with orthopedics.  Work note provided.

## 2023-09-27 NOTE — ED Triage Notes (Signed)
Pt c/o intermittent left side lower back pain radiating into buttocks and down left leg x 4 days. Denies gu symptoms. Nad. Took tylenol with no relief. States does lift a lot due to small children

## 2023-09-28 MED FILL — Hydrocodone-Acetaminophen Tab 5-325 MG: ORAL | Qty: 6 | Status: AC

## 2024-10-29 ENCOUNTER — Encounter (HOSPITAL_COMMUNITY): Payer: Self-pay

## 2024-10-29 ENCOUNTER — Emergency Department (HOSPITAL_COMMUNITY)
Admission: EM | Admit: 2024-10-29 | Discharge: 2024-10-29 | Disposition: A | Discharge status: Home / Self Care | Payer: Self-pay | Attending: Emergency Medicine | Admitting: Emergency Medicine

## 2024-10-29 ENCOUNTER — Other Ambulatory Visit: Payer: Self-pay

## 2024-10-29 ENCOUNTER — Emergency Department (HOSPITAL_COMMUNITY): Payer: Self-pay

## 2024-10-29 DIAGNOSIS — R1030 Lower abdominal pain, unspecified: Secondary | ICD-10-CM | POA: Insufficient documentation

## 2024-10-29 HISTORY — DX: Disorder of kidney and ureter, unspecified: N28.9

## 2024-10-29 LAB — COMPREHENSIVE METABOLIC PANEL WITH GFR
ALT: 15 U/L (ref 0–44)
AST: 16 U/L (ref 15–41)
Albumin: 4.2 g/dL (ref 3.5–5.0)
Alkaline Phosphatase: 69 U/L (ref 38–126)
Anion gap: 13 (ref 5–15)
BUN: 16 mg/dL (ref 6–20)
CO2: 23 mmol/L (ref 22–32)
Calcium: 8.6 mg/dL — ABNORMAL LOW (ref 8.9–10.3)
Chloride: 103 mmol/L (ref 98–111)
Creatinine, Ser: 0.76 mg/dL (ref 0.44–1.00)
GFR, Estimated: >60 mL/min
Glucose, Bld: 98 mg/dL (ref 70–99)
Potassium: 3.9 mmol/L (ref 3.5–5.1)
Sodium: 139 mmol/L (ref 135–145)
Total Bilirubin: 0.2 mg/dL (ref 0.0–1.2)
Total Protein: 7.2 g/dL (ref 6.5–8.1)

## 2024-10-29 LAB — URINALYSIS, ROUTINE W REFLEX MICROSCOPIC
Bacteria, UA: NONE SEEN
Bilirubin Urine: NEGATIVE
Glucose, UA: NEGATIVE mg/dL
Ketones, ur: NEGATIVE mg/dL
Leukocytes,Ua: NEGATIVE
Nitrite: NEGATIVE
Protein, ur: 30 mg/dL — AB
RBC / HPF: >50 RBC/hpf (ref 0–5)
Specific Gravity, Urine: 1.01 (ref 1.005–1.030)
pH: 6 (ref 5.0–8.0)

## 2024-10-29 LAB — CBC
HCT: 39.4 % (ref 36.0–46.0)
Hemoglobin: 13.6 g/dL (ref 12.0–15.0)
MCH: 30.8 pg (ref 26.0–34.0)
MCHC: 34.5 g/dL (ref 30.0–36.0)
MCV: 89.1 fL (ref 80.0–100.0)
Platelets: 238 K/uL (ref 150–400)
RBC: 4.42 MIL/uL (ref 3.87–5.11)
RDW: 13.2 % (ref 11.5–15.5)
WBC: 8.5 K/uL (ref 4.0–10.5)
nRBC: 0 % (ref 0.0–0.2)

## 2024-10-29 LAB — PREGNANCY, URINE: Preg Test, Ur: NEGATIVE

## 2024-10-29 LAB — LIPASE, BLOOD: Lipase: 39 U/L (ref 11–51)

## 2024-10-29 MED ORDER — KETOROLAC TROMETHAMINE 15 MG/ML IJ SOLN
15.0000 mg | Freq: Once | INTRAMUSCULAR | Status: AC
  Administered 2024-10-29: 15 mg via INTRAMUSCULAR
  Filled 2024-10-29: qty 1

## 2024-10-29 NOTE — ED Provider Notes (Signed)
 " [place] EMERGENC[facility] DEPARTMENT AT [facility] Provider Note  CSN: [id number] Arrival date & time: 10/29/24 1656  Chief Complaint(s) Abdominal Pain  HPI Colleen Arnold is a 39 y.o. female without relevant past medical history, presenting to the emergency department for lower abdominal pain.  Patient reports lower abdominal pain, reports that radiates to both flanks.  She also reports she started her menstrual cycle but it is cramping pain.  Does not normally have any cramping with her menstrual cycle.   Past Medical History Past Medical History:  Diagnosis Date   Bile duct stone    Cholecystitis, acute with cholelithiasis    Cyst, ovarian    Elevated LFTs    Gestational diabetes    Pregnancy induced hypertension    Renal disorder    Patient Active Problem List   Diagnosis Date Noted   Itching 08/16/2023   Encounter for well woman exam with routine gynecological exam 08/16/2023   History of gestational hypertension 10/19/2020   Class B DM 07/30/2020   Previous cesarean section 04/27/2020   Biliary obstruction (HCC) 10/06/2017   Elevated LFTs    Choledocholithiasis with obstruction    Home Medication(s) Prior to Admission medications  Medication Sig Start Date End Date Taking? Authorizing Provider  acetaminophen  (TYLENOL ) 325 MG tablet Take 2 tablets (650 mg total) by mouth every 4 (four) hours as needed for mild pain (temperature > 101.5.). 06/14/22   Cashion, Colter L, MD  HYDROcodone -acetaminophen  (NORCO/VICODIN) 5-325 MG tablet Take 1 tablet by mouth every 6 (six) hours as needed. 09/27/23   Zackowski, Scott, MD  levocetirizine (XYZAL ) 5 MG tablet Take 1 tablet at bedtime 08/16/23   Griffin, Jennifer A, NP  predniSONE  (DELTASONE ) 10 MG tablet Take 4 tablets (40 mg total) by mouth daily. 09/27/23   Geraldene Hamilton, MD                                                                                                                                    Past Surgical  History Past Surgical History:  Procedure Laterality Date   CESAREAN SECTION     CESAREAN SECTION N/A 10/19/2020   Procedure: REPEAT CESAREAN SECTION;  Surgeon: Lola Donnice HERO, MD;  Location: [facility] LD ORS;  Service: Obstetrics;  Laterality: N/A;   CESAREAN SECTION WITH BILATERAL TUBAL LIGATION N/A 06/12/2022   Procedure: CESAREAN SECTION WITH BILATERAL TUBAL LIGATION;  Surgeon: Ozan, Jennifer, DO;  Location: [facility] LD ORS;  Service: Obstetrics;  Laterality: N/A;   CHOLECYSTECTOMY     ERCP N/A 10/06/2017   Procedure: ENDOSCOPIC RETROGRADE CHOLANGIOPANCREATOGRAPHY (ERCP);  Surgeon: Avram Lupita BRAVO, MD;  Location: [facility] ENDOSCOPY;  Service: Endoscopy;  Laterality: N/A;   LAPAROSCOPIC UNILATERAL SALPINGO OOPHERECTOMY Left 11/28/2017   Procedure: LAPAROSCOPIC LEFT SALPINGO OOPHORECTOMY ;  Surgeon: Edsel Norleen GAILS, MD;  Location: [facility] ORS;  Service: Gynecology;  Laterality: Left;   Family History Family History  Problem Relation Age of Onset  Diabetes Father    Hyperlipidemia Mother    Hypertension Brother    Colon cancer Neg Hx     Social History Social History[1] Allergies Rocephin [ceftriaxone sodium in dextrose ]  Review of Systems Review of Systems  Physical Exam Vital Signs  I have reviewed the triage vital signs BP 139/85 (BP Location: Right Arm)   Pulse 85   Temp 98 F (36.7 C) (Temporal)   Resp 16   Ht 5' 2 (1.575 m)   Wt 120.9 kg   LMP 10/28/2024 (Exact Date)   SpO2 98%   BMI 48.75 kg/m  Physical Exam  ED Results and Treatments Labs (all labs ordered are listed, but only abnormal results are displayed) Labs Reviewed  COMPREHENSIVE METABOLIC PANEL WITH GFR - Abnormal; Notable for the following components:      Result Value   Calcium 8.6 (*)    All other components within normal limits  URINALYSIS, ROUTINE W REFLEX MICROSCOPIC - Abnormal; Notable for the following components:   Color, Urine AMBER (*)    APPearance HAZY (*)    Hgb urine dipstick LARGE (*)     Protein, ur 30 (*)    All other components within normal limits  LIPASE, BLOOD  CBC                                                                                                                          Radiology CT Renal Stone Study Result Date: 10/29/2024 EXAM: CT ABDOMEN AND PELVIS WITHOUT CONTRAST 10/29/2024 08:55:22 PM TECHNIQUE: CT of the abdomen and pelvis was performed without the administration of intravenous contrast. Multiplanar reformatted images are provided for review. Automated exposure control, iterative reconstruction, and/or weight-based adjustment of the mA/kV was utilized to reduce the radiation dose to as low as reasonably achievable. COMPARISON: Findings are compared to 12/24/2019. CLINICAL HISTORY: Abdominal and flank pain, stone suspected. Bilateral flank pain, hematuria, nephrolithiasis. FINDINGS: LOWER CHEST: No acute abnormality. LIVER: Mild hepatomegaly, progressive since prior examination. GALLBLADDER AND BILE DUCTS: Status post cholecystectomy. No biliary ductal dilatation. SPLEEN: No acute abnormality. PANCREAS: No acute abnormality. ADRENAL GLANDS: No acute abnormality. KIDNEYS, URETERS AND BLADDER: 1 mm punctate nonobstructing calculus is seen within the lower pole of the right kidney. No ureteral calculi. No hydronephrosis. No perinephric inflammatory stranding or fluid collections were identified. The bladder is unremarkable. GI AND BOWEL: Appendix normal. The stomach, small bowel, and large bowel are otherwise unremarkable. There is no bowel obstruction. PERITONEUM AND RETROPERITONEUM: No ascites. No free air. VASCULATURE: Aorta is normal in caliber. LYMPH NODES: No lymphadenopathy. REPRODUCTIVE ORGANS: No acute abnormality. BONES AND SOFT TISSUES: No acute osseous abnormality. No focal soft tissue abnormality. IMPRESSION: 1. Nonobstructing 1 mm calculus in the lower pole of the right kidney. 2. Mild hepatomegaly, progressive since prior examination. 3. Status post  cholecystectomy. Electronically signed by: Dorethia Molt MD 10/29/2024 09:03 PM EST RP Workstation: [id number]    Pertinent labs & imaging results that were available during my care  of the patient were reviewed by me and considered in my medical decision making (see MDM for details).  Medications Ordered in ED Medications  ketorolac  (TORADOL ) 15 MG/ML injection 15 mg (15 mg Intramuscular Given 10/29/24 2113)                                                                                                                                     Procedures Procedures  (including critical care time)  Medical Decision Making / ED Course   MDM:  ***      Additional history obtained: -Additional history obtained from {wsadditionalhistorian:28072} -External records from outside source obtained and reviewed including: Chart review including previous notes, labs, imaging, consultation notes including ***   Lab Tests: -I ordered, reviewed, and interpreted labs.   The pertinent results include:   Labs Reviewed  COMPREHENSIVE METABOLIC PANEL WITH GFR - Abnormal; Notable for the following components:      Result Value   Calcium 8.6 (*)    All other components within normal limits  URINALYSIS, ROUTINE W REFLEX MICROSCOPIC - Abnormal; Notable for the following components:   Color, Urine AMBER (*)    APPearance HAZY (*)    Hgb urine dipstick LARGE (*)    Protein, ur 30 (*)    All other components within normal limits  LIPASE, BLOOD  CBC    Notable for ***  EKG   EKG Interpretation Date/Time:    Ventricular Rate:    PR Interval:    QRS Duration:    QT Interval:    QTC Calculation:   R Axis:      Text Interpretation:           Imaging Studies ordered: I ordered imaging studies including *** On my interpretation imaging demonstrates *** I independently visualized and interpreted imaging. I agree with the radiologist interpretation   Medicines ordered and prescription  drug management: Meds ordered this encounter  Medications   ketorolac  (TORADOL ) 15 MG/ML injection 15 mg    -I have reviewed the patients home medicines and have made adjustments as needed   Consultations Obtained: I requested consultation with the ***,  and discussed lab and imaging findings as well as pertinent plan - they recommend: ***   Cardiac Monitoring: The patient was maintained on a cardiac monitor.  I personally viewed and interpreted the cardiac monitored which showed an underlying rhythm of: ***  Social Determinants of Health:  Diagnosis or treatment significantly limited by social determinants of health: {wssoc:28071}   Reevaluation: After the interventions noted above, I reevaluated the patient and found that their symptoms have {resolved/improved/worsened:23923::improved}  Co morbidities that complicate the patient evaluation  Past Medical History:  Diagnosis Date   Bile duct stone    Cholecystitis, acute with cholelithiasis    Cyst, ovarian    Elevated LFTs    Gestational diabetes    Pregnancy induced hypertension    Renal disorder  Dispostion: Disposition decision including need for hospitalization was considered, and patient {wsdispo:28070::discharged from emergency department.}    Final Clinical Impression(s) / ED Diagnoses Final diagnoses:  Lower abdominal pain     This chart was dictated using voice recognition software.  Despite best efforts to proofread,  errors can occur which can change the documentation meaning.     [1]  Social History Tobacco Use   Smoking status: Never   Smokeless tobacco: Never  Vaping Use   Vaping status: Never Used  Substance Use Topics   Alcohol use: Yes    Comment: socially   Drug use: No   "

## 2024-10-29 NOTE — Discharge Instructions (Signed)
 Please take Tylenol (acetaminophen) and Motrin (ibuprofen) for your symptoms at home.  You can take 1000 mg of Tylenol every 6 hours and 600 mg of Motrin every 6 hours as needed for your symptoms.  You can take these medicines together as needed, either at the same time, or alternating every 3 hours.

## 2024-10-29 NOTE — ED Triage Notes (Signed)
 Pt arrived via POV c/o bilateral flank pain with radiation form her back to her groin. Pt reports also being on her menstrual cycle, and is also seeing blood in her urine. Pt also reports Hx of kidney stones.

## 2024-12-30 ENCOUNTER — Ambulatory Visit: Payer: Self-pay | Admitting: Adult Health
